# Patient Record
Sex: Male | Born: 1975 | Race: White | Hispanic: No | Marital: Single
Health system: Midwestern US, Community
[De-identification: ages and names within clinical notes are randomized; demographics above are authoritative.]

## PROBLEM LIST (undated history)

## (undated) DIAGNOSIS — G4733 Obstructive sleep apnea (adult) (pediatric): Secondary | ICD-10-CM

## (undated) DIAGNOSIS — E785 Hyperlipidemia, unspecified: Secondary | ICD-10-CM

## (undated) DIAGNOSIS — F329 Major depressive disorder, single episode, unspecified: Secondary | ICD-10-CM

## (undated) DIAGNOSIS — R519 Headache, unspecified: Secondary | ICD-10-CM

## (undated) DIAGNOSIS — I2510 Atherosclerotic heart disease of native coronary artery without angina pectoris: Secondary | ICD-10-CM

## (undated) DIAGNOSIS — F419 Anxiety disorder, unspecified: Secondary | ICD-10-CM

## (undated) DIAGNOSIS — E669 Obesity, unspecified: Secondary | ICD-10-CM

## (undated) DIAGNOSIS — R51 Headache: Secondary | ICD-10-CM

## (undated) DIAGNOSIS — E119 Type 2 diabetes mellitus without complications: Secondary | ICD-10-CM

## (undated) DIAGNOSIS — I10 Essential (primary) hypertension: Secondary | ICD-10-CM

## (undated) DIAGNOSIS — F32A Depression, unspecified: Secondary | ICD-10-CM

## (undated) DIAGNOSIS — E1169 Type 2 diabetes mellitus with other specified complication: Secondary | ICD-10-CM

## (undated) DIAGNOSIS — Z9989 Dependence on other enabling machines and devices: Secondary | ICD-10-CM

## (undated) HISTORY — DX: Hyperlipidemia, unspecified: E78.5

## (undated) HISTORY — DX: Essential (primary) hypertension: I10

## (undated) HISTORY — DX: Type 2 diabetes mellitus without complications: E11.9

## (undated) HISTORY — PX: MOLE REMOVAL: SHX2046

## (undated) HISTORY — DX: Type 2 diabetes mellitus with other specified complication: E66.9

## (undated) HISTORY — DX: Obesity, unspecified: E11.69

## (undated) HISTORY — DX: Atherosclerotic heart disease of native coronary artery without angina pectoris: I25.10

---
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
## 1997-08-31 ENCOUNTER — Ambulatory Visit (HOSPITAL_COMMUNITY): Admission: RE | Admit: 1997-08-31 | Discharge: 1997-08-31 | Payer: Self-pay | Admitting: Family Medicine

## 2001-09-27 ENCOUNTER — Ambulatory Visit (HOSPITAL_BASED_OUTPATIENT_CLINIC_OR_DEPARTMENT_OTHER): Admission: RE | Admit: 2001-09-27 | Discharge: 2001-09-27 | Payer: Self-pay | Admitting: Otolaryngology

## 2003-05-19 ENCOUNTER — Emergency Department (HOSPITAL_COMMUNITY): Admission: EM | Admit: 2003-05-19 | Discharge: 2003-05-19 | Payer: Self-pay | Admitting: Emergency Medicine

## 2003-09-15 ENCOUNTER — Emergency Department (HOSPITAL_COMMUNITY): Admission: EM | Admit: 2003-09-15 | Discharge: 2003-09-15 | Payer: Self-pay | Admitting: Emergency Medicine

## 2005-02-27 ENCOUNTER — Emergency Department (HOSPITAL_COMMUNITY): Admission: EM | Admit: 2005-02-27 | Discharge: 2005-02-27 | Payer: Self-pay | Admitting: Emergency Medicine

## 2007-07-23 ENCOUNTER — Emergency Department (HOSPITAL_COMMUNITY): Admission: EM | Admit: 2007-07-23 | Discharge: 2007-07-23 | Payer: Self-pay | Admitting: Emergency Medicine

## 2008-10-03 ENCOUNTER — Emergency Department (HOSPITAL_COMMUNITY): Admission: EM | Admit: 2008-10-03 | Discharge: 2008-10-03 | Payer: Self-pay | Admitting: Licensed Clinical Social Worker

## 2013-06-19 ENCOUNTER — Encounter (HOSPITAL_COMMUNITY): Payer: Self-pay | Admitting: Emergency Medicine

## 2013-06-19 DIAGNOSIS — M545 Low back pain, unspecified: Secondary | ICD-10-CM | POA: Insufficient documentation

## 2013-06-19 DIAGNOSIS — F17200 Nicotine dependence, unspecified, uncomplicated: Secondary | ICD-10-CM | POA: Insufficient documentation

## 2013-06-19 DIAGNOSIS — E669 Obesity, unspecified: Secondary | ICD-10-CM | POA: Insufficient documentation

## 2013-06-19 DIAGNOSIS — R000 Tachycardia, unspecified: Secondary | ICD-10-CM | POA: Insufficient documentation

## 2013-06-19 NOTE — ED Notes (Signed)
Patient presents with c/o lower back pain.  States it started when he was on the riding mower.   Was seen by the companies MD and was told to go back to work but go back on light duty.  Company unable to provide light duty so he was at home for the week and doing light things.  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

## 2013-06-20 ENCOUNTER — Emergency Department (HOSPITAL_COMMUNITY)
Admission: EM | Admit: 2013-06-20 | Discharge: 2013-06-20 | Disposition: A | Discharge status: Home / Self Care | Payer: Managed Care, Other (non HMO) | Attending: Emergency Medicine | Admitting: Emergency Medicine

## 2013-06-20 ENCOUNTER — Emergency Department (HOSPITAL_COMMUNITY): Payer: Managed Care, Other (non HMO)

## 2013-06-20 DIAGNOSIS — M545 Low back pain, unspecified: Secondary | ICD-10-CM

## 2013-06-20 LAB — URINALYSIS, ROUTINE W REFLEX MICROSCOPIC
BILIRUBIN URINE: NEGATIVE
Glucose, UA: 100 mg/dL — AB
Hgb urine dipstick: NEGATIVE
Ketones, ur: NEGATIVE mg/dL
LEUKOCYTES UA: NEGATIVE
Nitrite: NEGATIVE
Protein, ur: NEGATIVE mg/dL
Specific Gravity, Urine: 1.028 (ref 1.005–1.030)
Urobilinogen, UA: 0.2 mg/dL (ref 0.0–1.0)
pH: 6.5 (ref 5.0–8.0)

## 2013-06-20 MED ORDER — ORPHENADRINE CITRATE ER 100 MG PO TB12: 100.0000 mg | ORAL_TABLET | Freq: Two times a day (BID) | ORAL | Status: DC

## 2013-06-20 MED ORDER — NAPROXEN 500 MG PO TABS: 500.0000 mg | ORAL_TABLET | Freq: Two times a day (BID) | ORAL | Status: DC

## 2013-06-20 MED ORDER — IBUPROFEN 800 MG PO TABS
800.0000 mg | ORAL_TABLET | Freq: Once | ORAL | Status: AC
  Administered 2013-06-20: 800 mg via ORAL
  Filled 2013-06-20: qty 1

## 2013-06-20 NOTE — ED Notes (Signed)
Patient transported to X-ray 

## 2013-06-20 NOTE — ED Provider Notes (Signed)
CSN: [id number]     Arrival date & time 06/19/13  2333 History   First MD Initiated Contact with Patient 06/20/13 [phone]     Chief Complaint  Patient presents with  . Back Pain     (Consider location/radiation/quality/duration/timing/severity/associated sxs/prior Treatment) HPI Comments: 38 year old male with obesity, smoker, alcohol history presents with worsening left lower back pain. Patient has had the back pain for the past one to 2 weeks and improved however recently at work for which he does lift objects it has worsened tonight.Patient denies urinary or bowel changes, active cancer, extremity weakness, IVDU, fevers, immunosuppression or significant trauma. Mild radiation to the groin. No known kidney stone or hernia history. No testicle pain or swelling. No discharge or hematuria. Pain is improved since waiting in ER. Pain is worse with movement and position.   Patient is a 38 y.o. male presenting with back pain. The history is provided by the patient.  Back Pain Associated symptoms: no abdominal pain, no dysuria, no fever, no headaches, no numbness and no weakness     History reviewed. No pertinent past medical history. Past Surgical History  Procedure Laterality Date  . Skin biopsy     History reviewed. No pertinent family history. History  Substance Use Topics  . Smoking status: Current Every Day Smoker  . Smokeless tobacco: Never Used  . Alcohol Use: Yes     Comment: occasionally    Review of Systems  Constitutional: Negative for fever and chills.  Respiratory: Negative for shortness of breath.   Gastrointestinal: Negative for vomiting and abdominal pain.  Genitourinary: Negative for dysuria and flank pain.  Musculoskeletal: Positive for back pain. Negative for neck pain and neck stiffness.  Neurological: Negative for weakness, light-headedness, numbness and headaches.      Allergies  Review of patient's allergies indicates no known allergies.  Home Medications    Prior to Admission medications   Medication Sig Start Date End Date Taking? Authorizing Provider  ibuprofen (ADVIL,MOTRIN) 200 MG tablet Take 400 mg by mouth every 6 (six) hours as needed for moderate pain.   Yes Historical Provider, MD   BP 119/30  Pulse 96  Temp(Src) 98.1 F (36.7 C) (Oral)  Resp 24  Ht 5\' 11"  (1.803 m)  Wt 290 lb (131.543 kg)  BMI 40.46 kg/m2  SpO2 97% Physical Exam  Nursing note and vitals reviewed. Constitutional: He is oriented to person, place, and time. He appears well-developed and well-nourished.  HENT:  Head: Normocephalic and atraumatic.  Eyes: Conjunctivae are normal. Right eye exhibits no discharge. Left eye exhibits no discharge.  Neck: Normal range of motion. Neck supple. No tracheal deviation present.  Cardiovascular: Regular rhythm.  Tachycardia present.   Pulmonary/Chest: Effort normal and breath sounds normal.  Abdominal: Soft. He exhibits no distension. There is no tenderness. There is no guarding.  Musculoskeletal: He exhibits no edema.  Patient tender left lower paraspinal lumbar, mild midline tenderness no step-off. Mild tight musculature.  Neurological: He is alert and oriented to person, place, and time.  Patient has 5+ strength in the lower extremity bilateral flexion extension at major joints. Sensation intact bilateral equal. Normal lower extremities reflexes.  Skin: Skin is warm. No rash noted.  Psychiatric: He has a normal mood and affect.    ED Course  Procedures (including critical care time) Emergency Focused Ultrasound Exam Limited retroperitoneal ultrasound of kidneys  Performed and interpreted by Dr. Reather Converse Indication: flank pain Focused abdominal ultrasound with both kidneys imaged in transverse and longitudinal planes  in real-time. Interpretation: no hydronephrosis visualized.  no stones or cysts visualized  Images archived electronically  Labs Review Labs Reviewed  URINALYSIS, ROUTINE W REFLEX MICROSCOPIC -  Abnormal; Notable for the following:    Glucose, UA 100 (*)    All other components within normal limits    Imaging Review Dg Lumbar Spine 2-3 Views  06/20/2013   CLINICAL DATA:  Low back pain for one week.  EXAM: LUMBAR SPINE - 2-3 VIEW  COMPARISON:  None.  FINDINGS: Five non rib-bearing lumbar-type vertebral bodies are intact and aligned with maintenance of the lumbar lordosis. Intervertebral disc heights are normal. No destructive bony lesions.  Sacroiliac joints are symmetric. Included prevertebral and paraspinal soft tissue planes are non-suspicious.  IMPRESSION: Negative.   Electronically Signed   By: Elon Alas   On: 06/20/2013 03:47     EKG Interpretation None      MDM   Final diagnoses:  Left low back pain   Clinically musculoskeletal versus disc herniation. With radiation of the groin testicular exam was done with normal-appearing testicles, normal position, right is equal bilateral to palpation of thigh, no hernia appreciated. Plan for lumbar x-rays, ibuprofen at bedside ultrasound look for hydronephrosis. Patient is stable to followup outpatient. X-ray reviewed no acute findings. Bedside ultrasound no hydronephrosis.  Results and differential diagnosis were discussed with the patient. Close follow up outpatient was discussed, patient comfortable with the plan.   Filed Vitals:   06/19/13 2341 06/20/13 0245 06/20/13 0257 06/20/13 0315  BP: 142/76 121/60 121/60 119/30  Pulse: 101 97 90 96  Temp: 98.1 F (36.7 C)     TempSrc: Oral     Resp: 18  24   Height: 5\' 11"  (1.803 m)     Weight: 290 lb (131.543 kg)     SpO2: 97% 96% 98% 97%       Mariea Clonts, MD 06/20/13 0600

## 2013-06-20 NOTE — Discharge Instructions (Signed)
Followup with primary care provider for further evaluation. Return to the ER if you develop weakness, bowel or bladder changes, fevers or worsening symptoms. If symptoms continue or worsen you may need an MRI outpatient at the discretion of your provider. Limit heavy lifting.  If you were given medicines take as directed.  If you are on coumadin or contraceptives realize their levels and effectiveness is altered by many different medicines.  If you have any reaction (rash, tongues swelling, other) to the medicines stop taking and see a physician.   Please follow up as directed and return to the ER or see a physician for new or worsening symptoms.  Thank you. Filed Vitals:   06/19/13 2341 06/20/13 0245 06/20/13 0257 06/20/13 0315  BP: 142/76 121/60 121/60 119/30  Pulse: 101 97 90 96  Temp: 98.1 F (36.7 C)     TempSrc: Oral     Resp: 18  24   Height: 5\' 11"  (1.803 m)     Weight: 290 lb (131.543 kg)     SpO2: 97% 96% 98% 97%

## 2013-06-20 NOTE — ED Notes (Signed)
Patient returned from xray.

## 2014-12-27 ENCOUNTER — Encounter: Payer: Self-pay | Admitting: Family Medicine

## 2014-12-27 ENCOUNTER — Ambulatory Visit (INDEPENDENT_AMBULATORY_CARE_PROVIDER_SITE_OTHER): Payer: BLUE CROSS/BLUE SHIELD | Admitting: Family Medicine

## 2014-12-27 VITALS — BP 122/78 | HR 76 | Temp 97.0°F | Ht 71.0 in | Wt 304.0 lb

## 2014-12-27 DIAGNOSIS — R81 Glycosuria: Secondary | ICD-10-CM

## 2014-12-27 DIAGNOSIS — IMO0001 Reserved for inherently not codable concepts without codable children: Secondary | ICD-10-CM

## 2014-12-27 DIAGNOSIS — E1165 Type 2 diabetes mellitus with hyperglycemia: Secondary | ICD-10-CM | POA: Diagnosis not present

## 2014-12-27 DIAGNOSIS — I10 Essential (primary) hypertension: Secondary | ICD-10-CM

## 2014-12-27 DIAGNOSIS — IMO0002 Reserved for concepts with insufficient information to code with codable children: Secondary | ICD-10-CM | POA: Insufficient documentation

## 2014-12-27 DIAGNOSIS — E1169 Type 2 diabetes mellitus with other specified complication: Secondary | ICD-10-CM | POA: Insufficient documentation

## 2014-12-27 DIAGNOSIS — E1159 Type 2 diabetes mellitus with other circulatory complications: Secondary | ICD-10-CM | POA: Insufficient documentation

## 2014-12-27 DIAGNOSIS — E785 Hyperlipidemia, unspecified: Secondary | ICD-10-CM

## 2014-12-27 LAB — POCT GLYCOSYLATED HEMOGLOBIN (HGB A1C): HEMOGLOBIN A1C: 8.4

## 2014-12-27 MED ORDER — METFORMIN HCL 1000 MG PO TABS: 1000.0000 mg | ORAL_TABLET | Freq: Two times a day (BID) | ORAL | Status: DC

## 2014-12-27 MED ORDER — LINAGLIPTIN 5 MG PO TABS: 5.0000 mg | ORAL_TABLET | Freq: Every day | ORAL | Status: DC

## 2014-12-27 NOTE — Progress Notes (Signed)
BP 122/78 mmHg  Pulse 76  Temp(Src) 97 F (36.1 C) (Oral)  Ht '5\' 11"'  (1.803 m)  Wt 304 lb (137.893 kg)  BMI 42.42 kg/m2   Subjective:    Patient ID: Carl [name], male    DOB: Sep 09, 1975, 39 y.o.   MRN: [id number]  HPI: Carl [name] is a 39 y.o. male presenting on 12/27/2014 for Evaluate A1C and microalbumin   HPI Glucose in urine Patient comes in today to establish care with [place] after he was admitted physical and they found glucose in his urine. He is coming to find out whether or not he has diabetes. His hemoglobin A1c that we check today came back as 8.4. We discussed diabetes and the symptoms that come along with it and he has been having increased urination and increased thirst. He denies any chest pain or shortness of breath or numbness or weakness. He denies any new issues with vision. He denies any problems or sores on his feet.  Hypertension Patient is currently on Imdur 30 mg daily and metoprolol 50 mg daily. His blood pressure is controlled at 122/78 today. Patient denies headaches, blurred vision, chest pains, shortness of breath, or weakness. Denies any side effects from medication and is content with current medication.  Hyperlipidemia Patient has been diagnosed with hyperlipidemia in the past and is on atorvastatin 40 mg daily. He denies any side effects or issues with the atorvastatin.  Relevant past medical, surgical, family and social history reviewed and updated as indicated. Interim medical history since our last visit reviewed. Allergies and medications reviewed and updated.  Review of Systems  Constitutional: Negative for fever and appetite change.  HENT: Negative for ear discharge and ear pain.   Eyes: Negative for discharge and visual disturbance.  Respiratory: Negative for chest tightness, shortness of breath and wheezing.   Cardiovascular: Negative for chest pain, palpitations and leg swelling.  Gastrointestinal: Negative for abdominal pain, diarrhea  and constipation.  Endocrine: Positive for polydipsia and polyuria. Negative for cold intolerance and heat intolerance.  Genitourinary: Negative for difficulty urinating.  Musculoskeletal: Negative for back pain and gait problem.  Skin: Negative for rash.  Neurological: Negative for dizziness, syncope, light-headedness and headaches.  All other systems reviewed and are negative.   Per HPI unless specifically indicated above  Social History   Social History  . Marital Status: Legally Separated    Spouse Name: N/A  . Number of Children: N/A  . Years of Education: N/A   Occupational History  . Not on file.   Social History Main Topics  . Smoking status: Current Every Day Smoker  . Smokeless tobacco: Never Used  . Alcohol Use: Yes     Comment: occasionally  . Drug Use: No  . Sexual Activity: Not on file   Other Topics Concern  . Not on file   Social History Narrative    Past Surgical History  Procedure Laterality Date  . Skin biopsy    . Tumor excision      left knee area    Family History  Problem Relation Age of Onset  . Diabetes Mother   . Diabetes Father       Medication List       This list is accurate as of: 12/27/14 10:07 AM.  Always use your most recent med list.               aspirin EC 81 MG tablet  Take 81 mg by mouth.  atorvastatin 80 MG tablet  Commonly known as:  LIPITOR  Take 40 mg by mouth.     isosorbide mononitrate 30 MG 24 hr tablet  Commonly known as:  IMDUR  Take 30 mg by mouth.     metoprolol 50 MG tablet  Commonly known as:  LOPRESSOR  Take 50 mg by mouth daily.     UNABLE TO FIND  Vit C 2000 daily           Objective:    BP 122/78 mmHg  Pulse 76  Temp(Src) 97 F (36.1 C) (Oral)  Ht '5\' 11"'  (1.803 m)  Wt 304 lb (137.893 kg)  BMI 42.42 kg/m2  Wt Readings from Last 3 Encounters:  12/27/14 304 lb (137.893 kg)  06/19/13 290 lb (131.543 kg)    Physical Exam  Constitutional: He is oriented to person, place,  and time. He appears well-developed and well-nourished. No distress.  Eyes: Conjunctivae and EOM are normal. Pupils are equal, round, and reactive to light. Right eye exhibits no discharge. No scleral icterus.  Neck: Neck supple. No thyromegaly present.  Cardiovascular: Normal rate, regular rhythm, normal heart sounds and intact distal pulses.   No murmur heard. Pulmonary/Chest: Effort normal and breath sounds normal. No respiratory distress. He has no wheezes.  Musculoskeletal: Normal range of motion. He exhibits no edema or tenderness.  Lymphadenopathy:    He has no cervical adenopathy.  Neurological: He is alert and oriented to person, place, and time. Coordination normal.  Skin: Skin is warm and dry. No rash noted. He is not diaphoretic.  Psychiatric: He has a normal mood and affect. His behavior is normal.  Vitals reviewed.   Results for orders placed or performed in visit on 12/27/14  POCT glycosylated hemoglobin (Hb A1C)  Result Value Ref Range   Hemoglobin A1C 8.4       Assessment & Plan:   Problem List Items Addressed This Visit      Cardiovascular and Mediastinum   Essential hypertension, benign    Patient has hypertension on metoprolol and Imdur. Continue medications      Relevant Medications   atorvastatin (LIPITOR) 80 MG tablet   isosorbide mononitrate (IMDUR) 30 MG 24 hr tablet   metoprolol (LOPRESSOR) 50 MG tablet   aspirin EC 81 MG tablet   Other Relevant Orders   CMP14+EGFR     Endocrine   Diabetes type 2, uncontrolled ([place])    Patient is a new diabetic with a hemoglobin A1c of 8.4. We will start him on metformin thousand milligrams twice a day and Tradjenta 5 mg daily. He initially will do the first week of metformin by cutting them in half and only taking 500 twice a day 2 reduce the side effects. Refer him to [facility] for education and have him back in 3 months with me.      Relevant Medications   atorvastatin (LIPITOR) 80 MG tablet   aspirin EC 81 MG  tablet   metFORMIN (GLUCOPHAGE) 1000 MG tablet   linagliptin (TRADJENTA) 5 MG TABS tablet   Other Relevant Orders   Ambulatory referral to Ophthalmology   CMP14+EGFR   Lipid panel     Other   Hyperlipidemia LDL goal <130    Patient has been on atorvastatin 40, will check labs.      Relevant Medications   atorvastatin (LIPITOR) 80 MG tablet   isosorbide mononitrate (IMDUR) 30 MG 24 hr tablet   metoprolol (LOPRESSOR) 50 MG tablet   aspirin EC 81 MG tablet  Other Relevant Orders   Lipid panel    Other Visit Diagnoses    Glucose found in urine on examination    -  Primary    Relevant Medications    metFORMIN (GLUCOPHAGE) 1000 MG tablet    linagliptin (TRADJENTA) 5 MG TABS tablet    Other Relevant Orders    Microalbumin, urine    POCT glycosylated hemoglobin (Hb A1C) (Completed)    Ambulatory referral to Ophthalmology        Follow up plan: No Follow-up on file.  Caryl Pina, MD [facility] 12/27/2014, 10:07 AM

## 2014-12-27 NOTE — Assessment & Plan Note (Signed)
Patient has hypertension on metoprolol and Imdur. Continue medications

## 2014-12-27 NOTE — Assessment & Plan Note (Signed)
Patient has been on atorvastatin 40, will check labs.

## 2014-12-27 NOTE — Assessment & Plan Note (Signed)
Patient is a new diabetic with a hemoglobin A1c of 8.4. We will start him on metformin thousand milligrams twice a day and Tradjenta 5 mg daily. He initially will do the first week of metformin by cutting them in half and only taking 500 twice a day 2 reduce the side effects. Refer him to [facility] for education and have him back in 3 months with me.

## 2014-12-27 NOTE — Patient Instructions (Signed)
Diabetes and Exercise Exercising regularly is important. It is not just about losing weight. It has many health benefits, such as:  Improving your overall fitness, flexibility, and endurance.  Increasing your bone density.  Helping with weight control.  Decreasing your body fat.  Increasing your muscle strength.  Reducing stress and tension.  Improving your overall health. People with diabetes who exercise gain additional benefits because exercise:  Reduces appetite.  Improves the body's use of blood sugar (glucose).  Helps lower or control blood glucose.  Decreases blood pressure.  Helps control blood lipids (such as cholesterol and triglycerides).  Improves the body's use of the hormone insulin by:  Increasing the body's insulin sensitivity.  Reducing the body's insulin needs.  Decreases the risk for heart disease because exercising:  Lowers cholesterol and triglycerides levels.  Increases the levels of good cholesterol (such as high-density lipoproteins [HDL]) in the body.  Lowers blood glucose levels. YOUR ACTIVITY PLAN  Choose an activity that you enjoy, and set realistic goals. To exercise safely, you should begin practicing any new physical activity slowly, and gradually increase the intensity of the exercise over time. Your health care provider or diabetes educator can help create an activity plan that works for you. General recommendations include:  Encouraging children to engage in at least 60 minutes of physical activity each day.  Stretching and performing strength training exercises, such as yoga or weight lifting, at least 2 times per week.  Performing a total of at least 150 minutes of moderate-intensity exercise each week, such as brisk walking or water aerobics.  Exercising at least 3 days per week, making sure you allow no more than 2 consecutive days to pass without exercising.  Avoiding long periods of inactivity (90 minutes or more). When you  have to spend an extended period of time sitting down, take frequent breaks to walk or stretch. RECOMMENDATIONS FOR EXERCISING WITH TYPE 1 OR TYPE 2 DIABETES   Check your blood glucose before exercising. If blood glucose levels are greater than 240 mg/dL, check for urine ketones. Do not exercise if ketones are present.  Avoid injecting insulin into areas of the body that are going to be exercised. For example, avoid injecting insulin into:  The arms when playing tennis.  The legs when jogging.  Keep a record of:  Food intake before and after you exercise.  Expected peak times of insulin action.  Blood glucose levels before and after you exercise.  The type and amount of exercise you have done.  Review your records with your health care provider. Your health care provider will help you to develop guidelines for adjusting food intake and insulin amounts before and after exercising.  If you take insulin or oral hypoglycemic agents, watch for signs and symptoms of hypoglycemia. They include:  Dizziness.  Shaking.  Sweating.  Chills.  Confusion.  Drink plenty of water while you exercise to prevent dehydration or heat stroke. Body water is lost during exercise and must be replaced.  Talk to your health care provider before starting an exercise program to make sure it is safe for you. Remember, almost any type of activity is better than none.   This information is not intended to replace advice given to you by your health care provider. Make sure you discuss any questions you have with your health care provider.   Document Released: 05/02/2003 Document Revised: 06/26/2014 Document Reviewed: 07/19/2012 Elsevier Interactive Patient Education 2016 [employer]  

## 2014-12-28 LAB — LIPID PANEL
Chol/HDL Ratio: 4.7 ratio (ref 0.0–5.0)
Cholesterol, Total: 122 mg/dL (ref 100–199)
HDL: 26 mg/dL — ABNORMAL LOW
LDL Calculated: 54 mg/dL (ref 0–99)
Triglycerides: 211 mg/dL — ABNORMAL HIGH (ref 0–149)
VLDL Cholesterol Cal: 42 mg/dL — ABNORMAL HIGH (ref 5–40)

## 2014-12-28 LAB — CMP14+EGFR
A/G RATIO: 1.6 (ref 1.1–2.5)
ALK PHOS: 88 IU/L (ref 39–117)
ALT: 28 IU/L (ref 0–44)
AST: 18 IU/L (ref 0–40)
Albumin: 4.1 g/dL (ref 3.5–5.5)
BUN/Creatinine Ratio: 11 (ref 9–20)
BUN: 10 mg/dL (ref 6–24)
Bilirubin Total: 0.6 mg/dL (ref 0.0–1.2)
CALCIUM: 8.9 mg/dL (ref 8.7–10.2)
CHLORIDE: 100 mmol/L (ref 97–106)
CO2: 24 mmol/L (ref 18–29)
Creatinine, Ser: 0.89 mg/dL (ref 0.76–1.27)
GFR calc Af Amer: 124 mL/min/{1.73_m2} (ref >59)
GFR, EST NON AFRICAN AMERICAN: 107 mL/min/{1.73_m2} (ref >59)
Globulin, Total: 2.6 g/dL (ref 1.5–4.5)
Glucose: 244 mg/dL — ABNORMAL HIGH (ref 65–99)
POTASSIUM: 4.4 mmol/L (ref 3.5–5.2)
Sodium: 137 mmol/L (ref 136–144)
Total Protein: 6.7 g/dL (ref 6.0–8.5)

## 2014-12-28 LAB — MICROALBUMIN, URINE: MICROALBUM., U, RANDOM: 74 ug/mL

## 2014-12-31 ENCOUNTER — Other Ambulatory Visit: Payer: Self-pay

## 2014-12-31 MED ORDER — METOPROLOL TARTRATE 25 MG PO TABS: 25.0000 mg | ORAL_TABLET | Freq: Every day | ORAL | Status: DC

## 2015-01-01 MED ORDER — LISINOPRIL 10 MG PO TABS: 10.0000 mg | ORAL_TABLET | Freq: Every day | ORAL | Status: DC

## 2015-01-01 NOTE — Addendum Note (Signed)
Addended by: Thana Ates on: 01/01/2015 08:34 AM   Modules accepted: Orders

## 2015-01-07 ENCOUNTER — Telehealth: Payer: Self-pay

## 2015-01-07 NOTE — Telephone Encounter (Signed)
Pt has a referral to eye doctor. Who has he seen in the past?

## 2015-01-16 ENCOUNTER — Ambulatory Visit: Payer: BLUE CROSS/BLUE SHIELD | Admitting: Pharmacist

## 2015-01-29 LAB — HM DIABETES EYE EXAM

## 2015-01-30 ENCOUNTER — Encounter: Payer: Self-pay | Admitting: *Deleted

## 2015-03-27 ENCOUNTER — Telehealth: Payer: Self-pay | Admitting: Family Medicine

## 2015-03-27 NOTE — Telephone Encounter (Signed)
Faxed labs DOS 12-27-14 to Dr. Salem Caster @ [place] [phone]. Called patient to let him know they had been faxed.

## 2015-03-29 ENCOUNTER — Ambulatory Visit: Payer: BLUE CROSS/BLUE SHIELD | Admitting: Family Medicine

## 2015-04-01 ENCOUNTER — Encounter: Payer: Self-pay | Admitting: Family Medicine

## 2015-04-10 ENCOUNTER — Ambulatory Visit: Payer: BLUE CROSS/BLUE SHIELD | Admitting: Family Medicine

## 2015-04-25 ENCOUNTER — Other Ambulatory Visit: Payer: Self-pay | Admitting: Family Medicine

## 2015-06-21 ENCOUNTER — Other Ambulatory Visit: Payer: Self-pay | Admitting: Family Medicine

## 2015-07-01 ENCOUNTER — Ambulatory Visit: Payer: BLUE CROSS/BLUE SHIELD | Admitting: Family Medicine

## 2015-07-02 ENCOUNTER — Encounter: Payer: Self-pay | Admitting: Family Medicine

## 2015-07-03 ENCOUNTER — Ambulatory Visit: Payer: BLUE CROSS/BLUE SHIELD | Admitting: Family Medicine

## 2015-07-08 ENCOUNTER — Encounter: Payer: Self-pay | Admitting: Family Medicine

## 2015-07-08 ENCOUNTER — Ambulatory Visit (INDEPENDENT_AMBULATORY_CARE_PROVIDER_SITE_OTHER): Payer: BLUE CROSS/BLUE SHIELD | Admitting: Family Medicine

## 2015-07-08 VITALS — BP 131/81 | HR 93 | Temp 97.6°F | Ht 71.0 in | Wt 303.0 lb

## 2015-07-08 DIAGNOSIS — B351 Tinea unguium: Secondary | ICD-10-CM

## 2015-07-08 DIAGNOSIS — E785 Hyperlipidemia, unspecified: Secondary | ICD-10-CM

## 2015-07-08 DIAGNOSIS — I10 Essential (primary) hypertension: Secondary | ICD-10-CM | POA: Diagnosis not present

## 2015-07-08 DIAGNOSIS — E1165 Type 2 diabetes mellitus with hyperglycemia: Secondary | ICD-10-CM

## 2015-07-08 DIAGNOSIS — E118 Type 2 diabetes mellitus with unspecified complications: Secondary | ICD-10-CM | POA: Insufficient documentation

## 2015-07-08 DIAGNOSIS — IMO0001 Reserved for inherently not codable concepts without codable children: Secondary | ICD-10-CM

## 2015-07-08 DIAGNOSIS — E1169 Type 2 diabetes mellitus with other specified complication: Secondary | ICD-10-CM | POA: Insufficient documentation

## 2015-07-08 LAB — BAYER DCA HB A1C WAIVED: HB A1C: 7.1 % — AB (ref <7.0)

## 2015-07-08 MED ORDER — LISINOPRIL 10 MG PO TABS: 10.0000 mg | ORAL_TABLET | Freq: Every day | ORAL | Status: DC

## 2015-07-08 MED ORDER — ATORVASTATIN CALCIUM 80 MG PO TABS: 40.0000 mg | ORAL_TABLET | Freq: Every day | ORAL | Status: DC

## 2015-07-08 MED ORDER — ISOSORBIDE MONONITRATE ER 30 MG PO TB24: 30.0000 mg | ORAL_TABLET | Freq: Every day | ORAL | Status: DC

## 2015-07-08 MED ORDER — LINAGLIPTIN 5 MG PO TABS: 5.0000 mg | ORAL_TABLET | Freq: Every day | ORAL | Status: DC

## 2015-07-08 MED ORDER — METFORMIN HCL 1000 MG PO TABS: 1000.0000 mg | ORAL_TABLET | Freq: Two times a day (BID) | ORAL | Status: DC

## 2015-07-08 MED ORDER — TERBINAFINE HCL 250 MG PO TABS: 250.0000 mg | ORAL_TABLET | Freq: Every day | ORAL | Status: DC

## 2015-07-08 NOTE — Progress Notes (Signed)
BP 131/81 mmHg  Pulse 93  Temp(Src) 97.6 F (36.4 C) (Oral)  Ht '5\' 11"'  (1.803 m)  Wt 303 lb (137.44 kg)  BMI 42.28 kg/m2   Subjective:    Patient ID: Carl [name], male    DOB: Jan 19, 1976, 40 y.o.   MRN: [id number]  HPI: Carl [name] is a 40 y.o. male presenting on 07/08/2015 for Diabetes and Medication Refill   HPI Type 2 diabetes. Patient is coming in for recheck of his type 2 diabetes today. He admits to not having taken the medicines as consistently as he should. He is both on metformin and Tradjenta. I won't point the Tradjenta cost him too much and then it got approved. He says that sometimes he only takes the metformin once a day and senna twice a day because he often forgets his evening medications. He says that he has been trying to change his diet a little bit but has not really been exercising much. He is not checking his blood sugars because we have not had him go ahead and do that at this point. He denies any new issues with his feet and didn't get to go see an ophthalmologist and they said he did not have any retinopathy currently. He is on an ACE inhibitor  Hypertension recheck Patient is coming in for a blood pressure recheck. His blood pressure today is 131/81. He is currently on lisinopril 10 mg and Imdur 30 and metoprolol 25. Patient denies headaches, blurred vision, chest pains, shortness of breath, or weakness. Denies any side effects from medication and is content with current medication.   Hyperlipidemia Patient is not due for recheck of cholesterol just yet. He is currently taking Lipitor 80 mg. He denies any myalgias or known liver issues.  Toenail thickening Patient is coming in also because he has been having thickening of his toenails and fungus between his toes and wants to do treatment for these. This assessment is been fighting for only 6 months to year that he can remember. He denies any fevers or chills or redness or warmth.  Relevant past medical,  surgical, family and social history reviewed and updated as indicated. Interim medical history since our last visit reviewed. Allergies and medications reviewed and updated.  Review of Systems  Constitutional: Negative for fever and chills.  HENT: Negative for ear discharge and ear pain.   Eyes: Negative for discharge and visual disturbance.  Respiratory: Negative for shortness of breath and wheezing.   Cardiovascular: Negative for chest pain and leg swelling.  Gastrointestinal: Negative for abdominal pain, diarrhea and constipation.  Genitourinary: Negative for urgency, hematuria, decreased urine volume and difficulty urinating.  Musculoskeletal: Negative for back pain and gait problem.  Skin: Negative for color change and rash.  Neurological: Negative for dizziness, syncope, light-headedness and headaches.  All other systems reviewed and are negative.   Per HPI unless specifically indicated above     Medication List       This list is accurate as of: 07/08/15  5:30 PM.  Always use your most recent med list.               aspirin EC 81 MG tablet  Take 81 mg by mouth.     atorvastatin 80 MG tablet  Commonly known as:  LIPITOR  Take 0.5 tablets (40 mg total) by mouth daily at 6 PM.     isosorbide mononitrate 30 MG 24 hr tablet  Commonly known as:  IMDUR  Take 1  tablet (30 mg total) by mouth daily.     linagliptin 5 MG Tabs tablet  Commonly known as:  TRADJENTA  Take 1 tablet (5 mg total) by mouth daily.     lisinopril 10 MG tablet  Commonly known as:  PRINIVIL,ZESTRIL  Take 1 tablet (10 mg total) by mouth daily.     metFORMIN 1000 MG tablet  Commonly known as:  GLUCOPHAGE  Take 1 tablet (1,000 mg total) by mouth 2 (two) times daily with a meal.     metoprolol tartrate 25 MG tablet  Commonly known as:  LOPRESSOR  Take 1 tablet (25 mg total) by mouth daily.     terbinafine 250 MG tablet  Commonly known as:  LAMISIL  Take 1 tablet (250 mg total) by mouth daily.       UNABLE TO FIND  Vit C 2000 daily           Objective:    BP 131/81 mmHg  Pulse 93  Temp(Src) 97.6 F (36.4 C) (Oral)  Ht '5\' 11"'  (1.803 m)  Wt 303 lb (137.44 kg)  BMI 42.28 kg/m2  Wt Readings from Last 3 Encounters:  07/08/15 303 lb (137.44 kg)  12/27/14 304 lb (137.893 kg)  06/19/13 290 lb (131.543 kg)    Physical Exam  Constitutional: He is oriented to person, place, and time. He appears well-developed and well-nourished. No distress.  Eyes: Conjunctivae and EOM are normal. Pupils are equal, round, and reactive to light. Right eye exhibits no discharge. No scleral icterus.  Cardiovascular: Normal rate, regular rhythm, normal heart sounds and intact distal pulses.   No murmur heard. Pulmonary/Chest: Effort normal and breath sounds normal. No respiratory distress. He has no wheezes.  Musculoskeletal: Normal range of motion. He exhibits no edema.  Neurological: He is alert and oriented to person, place, and time. Coordination normal.  Skin: Skin is warm and dry. No rash noted. He is not diaphoretic.  Onychomycosis of feet, worse in bilateral great toe  Psychiatric: He has a normal mood and affect. His behavior is normal.  Vitals reviewed.  Diabetic Foot Exam - Simple   Simple Foot Form  Diabetic Foot exam was performed with the following findings:  Yes 07/08/2015  5:29 PM  Visual Inspection  No deformities, no ulcerations, no other skin breakdown bilaterally:  Yes  Sensation Testing  Intact to touch and monofilament testing bilaterally:  Yes  Pulse Check  Posterior Tibialis and Dorsalis pulse intact bilaterally:  Yes  Comments  onychomycosis       Results for orders placed or performed in visit on 01/30/15  HM DIABETES EYE EXAM  Result Value Ref Range   HM Diabetic Eye Exam No Retinopathy No Retinopathy      Assessment & Plan:   Problem List Items Addressed This Visit      Cardiovascular and Mediastinum   Essential hypertension, benign   Relevant  Medications   lisinopril (PRINIVIL,ZESTRIL) 10 MG tablet   isosorbide mononitrate (IMDUR) 30 MG 24 hr tablet   atorvastatin (LIPITOR) 80 MG tablet   Other Relevant Orders   CMP14+EGFR (Completed)     Endocrine   Uncontrolled type 2 diabetes mellitus without complication, without long-term current use of insulin (HCC) - Primary   Relevant Medications   metFORMIN (GLUCOPHAGE) 1000 MG tablet   lisinopril (PRINIVIL,ZESTRIL) 10 MG tablet   linagliptin (TRADJENTA) 5 MG TABS tablet   atorvastatin (LIPITOR) 80 MG tablet   Other Relevant Orders   Microalbumin / creatinine urine ratio (  Completed)   Bayer DCA Hb A1c Waived (Completed)   CMP14+EGFR (Completed)     Other   Hyperlipidemia LDL goal <130   Relevant Medications   lisinopril (PRINIVIL,ZESTRIL) 10 MG tablet   isosorbide mononitrate (IMDUR) 30 MG 24 hr tablet   atorvastatin (LIPITOR) 80 MG tablet    Other Visit Diagnoses    Onychomycosis        Relevant Medications    terbinafine (LAMISIL) 250 MG tablet        Follow up plan: Return in about 3 months (around 10/08/2015), or if symptoms worsen or fail to improve.  Counseling provided for all of the vaccine components Orders Placed This Encounter  Procedures  . Microalbumin / creatinine urine ratio  . Bayer DCA Hb A1c Waived  . [place] Manika Hast, MD [facility] 07/08/2015, 5:30 PM

## 2015-07-09 LAB — CMP14+EGFR
ALK PHOS: 82 IU/L (ref 39–117)
ALT: 26 IU/L (ref 0–44)
AST: 17 IU/L (ref 0–40)
Albumin/Globulin Ratio: 1.4 (ref 1.2–2.2)
Albumin: 4.2 g/dL (ref 3.5–5.5)
BUN/Creatinine Ratio: 19 (ref 9–20)
BUN: 15 mg/dL (ref 6–24)
Bilirubin Total: 0.5 mg/dL (ref 0.0–1.2)
CALCIUM: 9.1 mg/dL (ref 8.7–10.2)
CO2: 24 mmol/L (ref 18–29)
CREATININE: 0.77 mg/dL (ref 0.76–1.27)
Chloride: 102 mmol/L (ref 96–106)
GFR calc Af Amer: 130 mL/min/{1.73_m2} (ref >59)
GFR, EST NON AFRICAN AMERICAN: 113 mL/min/{1.73_m2} (ref >59)
GLOBULIN, TOTAL: 3.1 g/dL (ref 1.5–4.5)
GLUCOSE: 178 mg/dL — AB (ref 65–99)
Potassium: 4.2 mmol/L (ref 3.5–5.2)
Sodium: 141 mmol/L (ref 134–144)
Total Protein: 7.3 g/dL (ref 6.0–8.5)

## 2015-07-09 LAB — MICROALBUMIN / CREATININE URINE RATIO
Creatinine, Urine: 156.6 mg/dL
MICROALB/CREAT RATIO: 70.6 mg/g{creat} — AB (ref 0.0–30.0)
MICROALBUM., U, RANDOM: 110.5 ug/mL

## 2015-08-23 ENCOUNTER — Ambulatory Visit: Payer: BLUE CROSS/BLUE SHIELD | Admitting: Family Medicine

## 2015-10-10 ENCOUNTER — Telehealth: Payer: Self-pay | Admitting: Family Medicine

## 2015-10-10 ENCOUNTER — Ambulatory Visit (INDEPENDENT_AMBULATORY_CARE_PROVIDER_SITE_OTHER): Payer: BLUE CROSS/BLUE SHIELD | Admitting: Family Medicine

## 2015-10-10 ENCOUNTER — Encounter: Payer: Self-pay | Admitting: Family Medicine

## 2015-10-10 VITALS — BP 103/66 | HR 84 | Temp 97.0°F | Ht 71.0 in | Wt 291.2 lb

## 2015-10-10 DIAGNOSIS — E785 Hyperlipidemia, unspecified: Secondary | ICD-10-CM

## 2015-10-10 DIAGNOSIS — F418 Other specified anxiety disorders: Secondary | ICD-10-CM | POA: Diagnosis not present

## 2015-10-10 DIAGNOSIS — I10 Essential (primary) hypertension: Secondary | ICD-10-CM | POA: Diagnosis not present

## 2015-10-10 DIAGNOSIS — F32A Depression, unspecified: Secondary | ICD-10-CM

## 2015-10-10 DIAGNOSIS — F329 Major depressive disorder, single episode, unspecified: Secondary | ICD-10-CM | POA: Insufficient documentation

## 2015-10-10 DIAGNOSIS — IMO0001 Reserved for inherently not codable concepts without codable children: Secondary | ICD-10-CM

## 2015-10-10 DIAGNOSIS — E1165 Type 2 diabetes mellitus with hyperglycemia: Secondary | ICD-10-CM

## 2015-10-10 DIAGNOSIS — F419 Anxiety disorder, unspecified: Secondary | ICD-10-CM

## 2015-10-10 DIAGNOSIS — G4733 Obstructive sleep apnea (adult) (pediatric): Secondary | ICD-10-CM | POA: Insufficient documentation

## 2015-10-10 LAB — BAYER DCA HB A1C WAIVED: HB A1C: 6.5 % (ref <7.0)

## 2015-10-10 MED ORDER — LINAGLIPTIN 5 MG PO TABS: 5.0000 mg | ORAL_TABLET | Freq: Every day | ORAL | 1 refills | Status: DC

## 2015-10-10 MED ORDER — METOPROLOL TARTRATE 25 MG PO TABS: 25.0000 mg | ORAL_TABLET | Freq: Every day | ORAL | 1 refills | Status: DC

## 2015-10-10 MED ORDER — ISOSORBIDE MONONITRATE ER 30 MG PO TB24
30.0000 mg | ORAL_TABLET | Freq: Every day | ORAL | 1 refills | Status: DC
Start: 2015-10-10 — End: 2016-04-24

## 2015-10-10 MED ORDER — METFORMIN HCL 1000 MG PO TABS: 1000.0000 mg | ORAL_TABLET | Freq: Two times a day (BID) | ORAL | 1 refills | Status: DC

## 2015-10-10 MED ORDER — LISINOPRIL 10 MG PO TABS: 10.0000 mg | ORAL_TABLET | Freq: Every day | ORAL | 1 refills | Status: DC

## 2015-10-10 MED ORDER — ATORVASTATIN CALCIUM 80 MG PO TABS: 40.0000 mg | ORAL_TABLET | Freq: Every day | ORAL | 1 refills | Status: DC

## 2015-10-10 NOTE — Telephone Encounter (Signed)
Patient called to make [place] aware that he will be using [facility] for his cpap machine and supplies.  He wants [place] to contact them when we get his paper chart back and let them know the setting information.  I told him that we would take care of this.

## 2015-10-10 NOTE — Assessment & Plan Note (Signed)
Patient admits to having anxiety and depression and suicidal thoughts but no action plans or thoughts to actually carry out. Discussed the possibility of starting the medication and patient does not want to do that. 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 Gave him a suicide hotline number

## 2015-10-10 NOTE — Progress Notes (Signed)
BP 103/66 (BP Location: Left Arm, Patient Position: Sitting, Cuff Size: Large)   Pulse 84   Temp 97 F (36.1 C) (Oral)   Ht _0  (1.803 m)   Wt 291 lb 3.2 oz (132.1 kg)   BMI 40.61 kg/m    Subjective:    Patient ID: Carl [name], male    DOB: 27-Apr-1975, 40 y.o.   MRN: [id number]  HPI: Carl [name] is a 40 y.o. male presenting on 10/10/2015 for Hyperlipidemia (3 month followup, patient is fasting); Hypertension; and Diabetes   HPI Hypertension recheck Patient is coming in today for hypertension recheck. His blood pressure today is 103/66. He is currently on lisinopril and metoprolol and Imdur. Patient denies headaches, blurred vision, chest pains, shortness of breath, or weakness. Denies any side effects from medication and is content with current medication.   Type 2 diabetes recheck Patient is coming in for recheck of type 2 diabetes today as well. He is currently on Tradjenta and metformin. He denies any issues with these medications. He does not check his blood sugar to regularly so does not know exactly where it runs. He denies any issues with his feet or his vision that he knows of. He is currently on an ACE inhibitor. He is also currently on a statin.  Hyperlipidemia recheck Patient is coming in for cholesterol today. He is currently on Lipitor 40 mg and denies any myalgias with it.  Sleep apnea symptoms Patient comes in today with complaints that his wife has told him that he snores at night and he has stopped breathing on multiple occasions and sometimes he does wake up gasping for air. He also says he has nonrestorative sleep and feels like he can fall asleep just about any time during the day if he is not doing something.  Anxiety and depression Patient scored high on his pH Q9 with a total of 12 and that he is having symptoms more than half the days and he says he even has some suicidal ideations but denies any action plan. He says he has trouble sleeping at night and  has decreased energy and difficulty doing the things that he usually likes to do. He denies any hopelessness or helplessness. Patient admits to feelings of depression but he does not want to do any kind of treatment at this point. He also does not want to do any type of counseling at this point. He says he has been like this for most of his life and is no different than what it usually is.  Relevant past medical, surgical, family and social history reviewed and updated as indicated. Interim medical history since our last visit reviewed. Allergies and medications reviewed and updated.  Review of Systems  Constitutional: Negative for chills and fever.  HENT: Negative for ear discharge and ear pain.   Eyes: Negative for discharge and visual disturbance.  Respiratory: Negative for shortness of breath and wheezing.   Cardiovascular: Negative for chest pain and leg swelling.  Gastrointestinal: Negative for abdominal pain, constipation and diarrhea.  Genitourinary: Negative for difficulty urinating.  Musculoskeletal: Negative for back pain and gait problem.  Skin: Negative for rash.  Neurological: Negative for syncope, light-headedness and headaches.  Psychiatric/Behavioral: Positive for dysphoric mood, sleep disturbance and suicidal ideas (But no action plan or feelings that he was actually carry it out.Marland Kitchen). Negative for decreased concentration and self-injury. The patient is nervous/anxious.   All other systems reviewed and are negative.   Per HPI unless specifically  indicated above     Medication List       Accurate as of 10/10/15  9:05 AM. Always use your most recent med list.          aspirin EC 81 MG tablet Take 81 mg by mouth.   atorvastatin 80 MG tablet Commonly known as:  LIPITOR Take 0.5 tablets (40 mg total) by mouth daily at 6 PM.   isosorbide mononitrate 30 MG 24 hr tablet Commonly known as:  IMDUR Take 1 tablet (30 mg total) by mouth daily.   linagliptin 5 MG Tabs  tablet Commonly known as:  TRADJENTA Take 1 tablet (5 mg total) by mouth daily.   lisinopril 10 MG tablet Commonly known as:  PRINIVIL,ZESTRIL Take 1 tablet (10 mg total) by mouth daily.   metFORMIN 1000 MG tablet Commonly known as:  GLUCOPHAGE Take 1 tablet (1,000 mg total) by mouth 2 (two) times daily with a meal.   metoprolol tartrate 25 MG tablet Commonly known as:  LOPRESSOR Take 1 tablet (25 mg total) by mouth daily.   terbinafine 250 MG tablet Commonly known as:  LAMISIL Take 1 tablet (250 mg total) by mouth daily.   UNABLE TO FIND Vit C 2000 daily          Objective:    BP 103/66 (BP Location: Left Arm, Patient Position: Sitting, Cuff Size: Large)   Pulse 84   Temp 97 F (36.1 C) (Oral)   Ht _0  (1.803 m)   Wt 291 lb 3.2 oz (132.1 kg)   BMI 40.61 kg/m   Wt Readings from Last 3 Encounters:  10/10/15 291 lb 3.2 oz (132.1 kg)  07/08/15 (!) 303 lb (137.4 kg)  12/27/14 (!) 304 lb (137.9 kg)    Physical Exam  Constitutional: He is oriented to person, place, and time. He appears well-developed and well-nourished. No distress.  Eyes: Conjunctivae and EOM are normal. Pupils are equal, round, and reactive to light. Right eye exhibits no discharge. No scleral icterus.  Neck: Neck supple. No thyromegaly present.  Cardiovascular: Normal rate, regular rhythm, normal heart sounds and intact distal pulses.   No murmur heard. Pulmonary/Chest: Effort normal and breath sounds normal. No respiratory distress. He has no wheezes.  Musculoskeletal: Normal range of motion. He exhibits no edema.  Lymphadenopathy:    He has no cervical adenopathy.  Neurological: He is alert and oriented to person, place, and time. Coordination normal.  Skin: Skin is warm and dry. No rash noted. He is not diaphoretic.  Psychiatric: His behavior is normal. Judgment normal. His mood appears anxious. Cognition and memory are normal. He exhibits a depressed mood. He expresses suicidal ideation. He  expresses no suicidal plans.  Nursing note and vitals reviewed.     Assessment & Plan:   Problem List Items Addressed This Visit      Cardiovascular and Mediastinum   Essential hypertension, benign - Primary   Relevant Medications   metoprolol tartrate (LOPRESSOR) 25 MG tablet   lisinopril (PRINIVIL,ZESTRIL) 10 MG tablet   isosorbide mononitrate (IMDUR) 30 MG 24 hr tablet   atorvastatin (LIPITOR) 80 MG tablet   Other Relevant Orders   CMP14+EGFR   TSH     Respiratory   OSA (obstructive sleep apnea)   Relevant Orders   For home use only DME continuous positive airway pressure (CPAP)   Ambulatory referral to Sleep Studies     Endocrine   Uncontrolled type 2 diabetes mellitus without complication, without long-term current use of insulin (  [place])   Relevant Medications   metFORMIN (GLUCOPHAGE) 1000 MG tablet   lisinopril (PRINIVIL,ZESTRIL) 10 MG tablet   linagliptin (TRADJENTA) 5 MG TABS tablet   atorvastatin (LIPITOR) 80 MG tablet   Other Relevant Orders   Bayer DCA Hb A1c Waived   TSH     Other   Hyperlipidemia LDL goal <130   Relevant Medications   metoprolol tartrate (LOPRESSOR) 25 MG tablet   lisinopril (PRINIVIL,ZESTRIL) 10 MG tablet   isosorbide mononitrate (IMDUR) 30 MG 24 hr tablet   atorvastatin (LIPITOR) 80 MG tablet   Other Relevant Orders   Lipid panel   Anxiety and depression    Patient admits to having anxiety and depression and suicidal thoughts but no action plans or thoughts to actually carry out. Discussed the possibility of starting the medication and patient does not want to do that. 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 Gave him a suicide hotline number       Other Visit Diagnoses   None.      Follow up plan: Return in about 3 months (around 01/10/2016), or if symptoms worsen or fail to improve, for Diabetes and hypertension  follow-up.  Counseling provided for all of the vaccine components Orders Placed This Encounter  Procedures  . For home use only DME continuous positive airway pressure (CPAP)  . Bayer DCA Hb A1c Waived  . Lipid panel  . CMP14+EGFR  . TSH  . Ambulatory referral to Sleep Studies    Caryl Pina, MD [facility] 10/10/2015, 9:05 AM

## 2015-10-11 LAB — CMP14+EGFR
ALK PHOS: 69 IU/L (ref 39–117)
ALT: 24 IU/L (ref 0–44)
AST: 16 IU/L (ref 0–40)
Albumin/Globulin Ratio: 1.4 (ref 1.2–2.2)
Albumin: 4.3 g/dL (ref 3.5–5.5)
BUN/Creatinine Ratio: 18 (ref 9–20)
BUN: 17 mg/dL (ref 6–24)
Bilirubin Total: 0.4 mg/dL (ref 0.0–1.2)
CO2: 21 mmol/L (ref 18–29)
CREATININE: 0.94 mg/dL (ref 0.76–1.27)
Calcium: 9.5 mg/dL (ref 8.7–10.2)
Chloride: 101 mmol/L (ref 96–106)
GFR calc Af Amer: 116 mL/min/{1.73_m2} (ref >59)
GFR calc non Af Amer: 100 mL/min/{1.73_m2} (ref >59)
GLUCOSE: 144 mg/dL — AB (ref 65–99)
Globulin, Total: 3 g/dL (ref 1.5–4.5)
Potassium: 4.5 mmol/L (ref 3.5–5.2)
SODIUM: 138 mmol/L (ref 134–144)
Total Protein: 7.3 g/dL (ref 6.0–8.5)

## 2015-10-11 LAB — LIPID PANEL
CHOL/HDL RATIO: 4.9 ratio (ref 0.0–5.0)
Cholesterol, Total: 143 mg/dL (ref 100–199)
HDL: 29 mg/dL — ABNORMAL LOW (ref >39)
LDL CALC: 73 mg/dL (ref 0–99)
Triglycerides: 203 mg/dL — ABNORMAL HIGH (ref 0–149)
VLDL CHOLESTEROL CAL: 41 mg/dL — AB (ref 5–40)

## 2015-10-11 LAB — TSH: TSH: 1.5 u[IU]/mL (ref 0.450–4.500)

## 2015-10-18 ENCOUNTER — Telehealth: Payer: Self-pay | Admitting: Family Medicine

## 2015-10-18 NOTE — Telephone Encounter (Signed)
Spoke to pt and he has already spoken with Jan and doesn't need anymore assistance. Will close encounter.

## 2015-10-18 NOTE — Telephone Encounter (Signed)
Copy of sleep study was faxed to [facility], attn HME, patient was informed.

## 2015-12-19 ENCOUNTER — Ambulatory Visit (INDEPENDENT_AMBULATORY_CARE_PROVIDER_SITE_OTHER)
Admission: RE | Admit: 2015-12-19 | Discharge: 2015-12-19 | Disposition: A | Discharge status: Home / Self Care | Payer: BLUE CROSS/BLUE SHIELD | Source: Ambulatory Visit | Attending: Pulmonary Disease | Admitting: Pulmonary Disease

## 2015-12-19 ENCOUNTER — Ambulatory Visit (INDEPENDENT_AMBULATORY_CARE_PROVIDER_SITE_OTHER): Payer: BLUE CROSS/BLUE SHIELD | Admitting: Pulmonary Disease

## 2015-12-19 ENCOUNTER — Encounter: Payer: Self-pay | Admitting: Pulmonary Disease

## 2015-12-19 VITALS — BP 132/94 | HR 93 | Ht 71.0 in | Wt 296.2 lb

## 2015-12-19 DIAGNOSIS — G4733 Obstructive sleep apnea (adult) (pediatric): Secondary | ICD-10-CM | POA: Diagnosis not present

## 2015-12-19 DIAGNOSIS — R0609 Other forms of dyspnea: Secondary | ICD-10-CM | POA: Diagnosis not present

## 2015-12-19 DIAGNOSIS — Z720 Tobacco use: Secondary | ICD-10-CM | POA: Insufficient documentation

## 2015-12-19 DIAGNOSIS — R0600 Dyspnea, unspecified: Secondary | ICD-10-CM | POA: Diagnosis not present

## 2015-12-19 DIAGNOSIS — Z6841 Body Mass Index (BMI) 40.0 and over, adult: Secondary | ICD-10-CM

## 2015-12-19 DIAGNOSIS — E662 Morbid (severe) obesity with alveolar hypoventilation: Secondary | ICD-10-CM

## 2015-12-19 NOTE — Assessment & Plan Note (Signed)
Patient with exertional dyspnea. 40 pack year smoking history. Has CAD. Strong family history of cancer.  Exertional dyspnea 2/2 " 1. Morbid obesity. 2. Restrictive ventilatory defect. 3. Likely underlying COPD. He wants to hold off on PFTs and meds. 4. CAD  Plan : 1. CXR today. May need chest ct scan.  2. He wants to hold off on meds.  3. We does not want vaccines.

## 2015-12-19 NOTE — Assessment & Plan Note (Addendum)
Weight reduction 

## 2015-12-19 NOTE — Progress Notes (Signed)
Subjective:    Patient ID: Carl [name], male    DOB: [birth year], 40 y.o.   MRN: [id number]  HPI   This is the case of Carl [name], 40 y.o. Male, who was referred by Dr. Vonna Kotyk Dettinger  in consultation regarding OSA.   As you very well know, patient 72 PY smoking history, not known to hava asthma or COPD.  Patient was diagnosed with OSA in 2002 based on a lab sleep study in [place].  He had snoring, winessed apneas, gasping, choking. Had unrefreshed sleep.  Patient was placed on CPAP. He felt better with it. He has had 3 macines. Last machine paid by insurance was roughly 10 yrs ago. That machine broke and he needed a new machine. His insurance did not approve for a new machine. He was able to get a loaner machine. That machine he got 5 yrs ago.  He was using it and he felt better. Unfortunately, machine broke 5 months ago.   His sx are worse off cpap.  Has snoring, witnessed apneas,occasional gasping and choking. Has hypersomnia in am.  Hypersomnia affects his fxnality.   ESS 14.   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 Hypersomnia affects his fxnality.    He has CAD and DM.    Review of Systems  Constitutional: Negative.  Negative for fever and unexpected weight change.  HENT: Positive for ear pain. Negative for congestion, dental problem, nosebleeds, postnasal drip, rhinorrhea, sinus pressure, sneezing, sore throat and trouble swallowing.   Eyes: Positive for redness and itching.  Respiratory: Positive for cough and chest tightness. Negative for shortness of breath and wheezing.   Cardiovascular: Negative.  Negative for palpitations and leg swelling.  Gastrointestinal: Negative.  Negative for nausea and vomiting.  Endocrine: Negative.   Genitourinary: Negative.  Negative for dysuria.  Musculoskeletal: Negative.  Negative for joint swelling.  Skin: Negative.  Negative for rash.  Allergic/Immunologic: Positive for environmental allergies.  Neurological: Positive for  headaches.  Hematological: Bruises/bleeds easily.  Psychiatric/Behavioral: Negative.  Negative for dysphoric mood. The patient is not nervous/anxious.    Past Medical History:  Diagnosis Date  . Diabetes mellitus without complication ([place])   . Hyperlipidemia   . Hypertension    (-) CA, DVT  Family History  Problem Relation Age of Onset  . Diabetes Mother   . Diabetes Father   Father has renal CA. Mother had breast CA.   Past Surgical History:  Procedure Laterality Date  . SKIN BIOPSY    . TUMOR EXCISION     left knee area    Social History   Social History  . Marital status: Legally Separated    Spouse name: N/A  . Number of children: N/A  . Years of education: N/A   Occupational History  . Not on file.   Social History Main Topics  . Smoking status: Current Every Day Smoker  . Smokeless tobacco: Never Used  . Alcohol use Yes     Comment: occasionally  . Drug use: No  . Sexual activity: Not on file   Other Topics Concern  . Not on file   Social History Narrative  . No narrative on file   Divroced, has a son. Is a truck driver. Occasional ETOH.   No Known Allergies   Outpatient Medications Prior to Visit  Medication Sig Dispense Refill  . aspirin EC 81 MG tablet Take 81 mg by mouth.    Marland Kitchen atorvastatin (LIPITOR) 80 MG  tablet Take 0.5 tablets (40 mg total) by mouth daily at 6 PM. 90 tablet 1  . isosorbide mononitrate (IMDUR) 30 MG 24 hr tablet Take 1 tablet (30 mg total) by mouth daily. 90 tablet 1  . linagliptin (TRADJENTA) 5 MG TABS tablet Take 1 tablet (5 mg total) by mouth daily. 90 tablet 1  . lisinopril (PRINIVIL,ZESTRIL) 10 MG tablet Take 1 tablet (10 mg total) by mouth daily. 90 tablet 1  . metFORMIN (GLUCOPHAGE) 1000 MG tablet Take 1 tablet (1,000 mg total) by mouth 2 (two) times daily with a meal. 180 tablet 1  . metoprolol tartrate (LOPRESSOR) 25 MG tablet Take 1 tablet (25 mg total) by mouth daily. 90 tablet 1  . UNABLE TO FIND Vit C 2000 daily      . terbinafine (LAMISIL) 250 MG tablet Take 1 tablet (250 mg total) by mouth daily. (Patient not taking: Reported on 12/19/2015) 90 tablet 0   No facility-administered medications prior to visit.    No orders of the defined types were placed in this encounter.       Objective:   Physical Exam  Vitals:  Vitals:   12/19/15 1029  BP: (!) 132/94  Pulse: 93  SpO2: 96%  Weight: 296 lb 3.2 oz (134.4 kg)  Height: 5\' 11"  (1.803 m)    Constitutional/General:  Pleasant, well-nourished, well-developed, not in any distress,  Comfortably seating.  Well kempt  Body mass index is 41.31 kg/m. Wt Readings from Last 3 Encounters:  12/19/15 296 lb 3.2 oz (134.4 kg)  10/10/15 291 lb 3.2 oz (132.1 kg)  07/08/15 (!) 303 lb (137.4 kg)    HEENT: Pupils equal and reactive to light and accommodation. Anicteric sclerae. Normal nasal mucosa.   No oral  lesions,  mouth clear,  oropharynx clear, no postnasal drip. (-) Oral thrush. No dental caries.  Airway - Mallampati class IV  Neck: No masses. Midline trachea. No JVD, (-) LAD. (-) bruits appreciated.  Respiratory/Chest: Grossly normal chest. (-) deformity. (-) Accessory muscle use.  Symmetric expansion. (-) Tenderness on palpation.  Resonant on percussion.  Diminished BS on both lower lung zones. (-) wheezing, crackles, rhonchi (-) egophony  Cardiovascular: Regular rate and  rhythm, heart sounds normal, no murmur or gallops, no peripheral edema  Gastrointestinal:  Normal bowel sounds. Soft, non-tender. No hepatosplenomegaly.  (-) masses.   Musculoskeletal:  Normal muscle tone. Normal gait.   Extremities: Grossly normal. (-) clubbing, cyanosis.  (-) edema  Skin: (-) rash,lesions seen.   Neurological/Psychiatric : alert, oriented to time, place, person. Normal mood and affect          Assessment & Plan:  OSA (obstructive sleep apnea) Patient was diagnosed with OSA in 2002 based on a lab sleep study in [place].  He had snoring,  winessed apneas, gasping, choking. Had unrefreshed sleep.  Patient was placed on CPAP. He felt better with it. He has had 3 macines. Last machine paid by insurance was roughly 10 yrs ago. That machine broke and he needed a new machine. His insurance did not approve for a new machine. He was able to get a loaner machine. That machine he got 5 yrs ago.  He was using it and he felt better. Unfortunately, machine broke 5 months ago.   His sx are worse off cpap.  Has snoring, witnessed apneas,occasional gasping and choking. Has hypersomnia in am.  Hypersomnia affects his fxnality.   ESS 14.   Plan:  We discussed about the diagnosis of Obstructive Sleep  Apnea (OSA) and implications of untreated OSA. We discussed about CPAP and BiPaP as possible treatment options.    We will schedule the patient for a sleep study. Patient has a lot of deductible. Plan for a home sleep study. Anticipate no issues with CPAP. Plan to start with auto CPAP 5-15 centimeters water. Needs good correction secondary to his work. If auto CPAP will not work, he will need a sleep study for BiPAP Titration study.   Patient was instructed to call the office if he/she has not heard back from the office 1-2 weeks after the sleep study.   Patient was instructed to call the office if he/she is having issues with the PAP device.   We discussed good sleep hygiene.   Patient was advised not to engage in activities requiring concentration and/or vigilance if he/she is sleepy.  Patient was advised not to drive if he/she is sleepy.   Exertional dyspnea Patient with exertional dyspnea. 40 pack year smoking history. Has CAD. Strong family history of cancer.  Exertional dyspnea 2/2 " 1. Morbid obesity. 2. Restrictive ventilatory defect. 3. Likely underlying COPD. He wants to hold off on PFTs and meds. 4. CAD  Plan : 1. CXR today. May need chest ct scan.  2. He wants to hold off on meds.  3. We does not want vaccines.   Tobacco  user Smoking cessation done.   Morbid obesity ([place]) Weight reduction  Obesity hypoventilation syndrome ([place]) Needs abg on f/u.  Needs sleep study. Hopefully better with cpap.      Thank you very much for letting me participate in this patient's care. Please do not hesitate to give me a call if you have any questions or concerns regarding the treatment plan.   Patient will follow up with me in 6-8 weeks.     Monica Becton, MD 12/19/2015   11:25 AM Pulmonary and [place] Pager: [phone] Office: [phone], Fax: [phone]

## 2015-12-19 NOTE — Assessment & Plan Note (Signed)
Patient was diagnosed with OSA in 2002 based on a lab sleep study in [place].  He had snoring, winessed apneas, gasping, choking. Had unrefreshed sleep.  Patient was placed on CPAP. He felt better with it. He has had 3 macines. Last machine paid by insurance was roughly 10 yrs ago. That machine broke and he needed a new machine. His insurance did not approve for a new machine. He was able to get a loaner machine. That machine he got 5 yrs ago.  He was using it and he felt better. Unfortunately, machine broke 5 months ago.   His sx are worse off cpap.  Has snoring, witnessed apneas,occasional gasping and choking. Has hypersomnia in am.  Hypersomnia affects his fxnality.   ESS 14.   Plan:  We discussed about the diagnosis of Obstructive Sleep Apnea (OSA) and implications of untreated OSA. We discussed about CPAP and BiPaP as possible treatment options.    We will schedule the patient for a sleep study. Patient has a lot of deductible. Plan for a home sleep study. Anticipate no issues with CPAP. Plan to start with auto CPAP 5-15 centimeters water. Needs good correction secondary to his work. If auto CPAP will not work, he will need a sleep study for BiPAP Titration study.   Patient was instructed to call the office if he/she has not heard back from the office 1-2 weeks after the sleep study.   Patient was instructed to call the office if he/she is having issues with the PAP device.   We discussed good sleep hygiene.   Patient was advised not to engage in activities requiring concentration and/or vigilance if he/she is sleepy.  Patient was advised not to drive if he/she is sleepy.

## 2015-12-19 NOTE — Patient Instructions (Signed)
It was a pleasure taking care of you today!  We will schedule you to have a sleep study to determine if you have sleep apnea.   We will get a home sleep test.  You will be instructed to come back to the office to get an apparatus to sleep with overnight.  Once we have the apparatus, it will usually take [place] 1-2 weeks to read the study and get back at you with results of the test.  Please give [place] a call in 2 weeks after your study if you do not hear back from [place].   If the sleep study is positive, we will order you a CPAP  machine.  Please call the office if you do NOT receive your machine in the next 1-2 weeks.   Please make sure you use your CPAP device everytime you sleep.  We will monitor the usage of your machine per your insurance requirement.  Your insurance company may take the machine from you if you are not using it regularly.   Please clean the mask, tubings, filter, water reservoir with soapy water every week.  Please use distilled water for the water reservoir.   Please call the office or your machine provider (DME company) if you are having issues with the device.   We will get a chest X ray.   Return to clinic in 6-8 weeks with Dr. Corrie Dandy or NP

## 2015-12-19 NOTE — Assessment & Plan Note (Signed)
Smoking cessation done.  

## 2015-12-19 NOTE — Assessment & Plan Note (Signed)
Needs abg on f/u.  Needs sleep study. Hopefully better with cpap.

## 2015-12-20 NOTE — Progress Notes (Signed)
Tried to call pt. But vm was not set up yet

## 2015-12-27 ENCOUNTER — Telehealth: Payer: Self-pay | Admitting: Family Medicine

## 2015-12-27 DIAGNOSIS — I10 Essential (primary) hypertension: Secondary | ICD-10-CM

## 2015-12-27 DIAGNOSIS — R0609 Other forms of dyspnea: Secondary | ICD-10-CM

## 2015-12-27 DIAGNOSIS — Z720 Tobacco use: Secondary | ICD-10-CM

## 2015-12-27 NOTE — Telephone Encounter (Signed)
Referral to Cardiologists ordered

## 2015-12-27 NOTE — Telephone Encounter (Signed)
Patient is requesting a referral to cardiology for a treadmill. Patients cardiologist in [place] moved and he wants to go somewhere local.

## 2016-01-13 DIAGNOSIS — G4733 Obstructive sleep apnea (adult) (pediatric): Secondary | ICD-10-CM | POA: Diagnosis not present

## 2016-01-17 ENCOUNTER — Ambulatory Visit: Payer: BLUE CROSS/BLUE SHIELD | Admitting: Family Medicine

## 2016-01-22 ENCOUNTER — Encounter: Payer: Self-pay | Admitting: Family Medicine

## 2016-01-22 ENCOUNTER — Ambulatory Visit (INDEPENDENT_AMBULATORY_CARE_PROVIDER_SITE_OTHER): Payer: BLUE CROSS/BLUE SHIELD | Admitting: Family Medicine

## 2016-01-22 ENCOUNTER — Ambulatory Visit: Payer: BLUE CROSS/BLUE SHIELD | Admitting: Family Medicine

## 2016-01-22 ENCOUNTER — Encounter: Payer: Self-pay | Admitting: Cardiology

## 2016-01-22 ENCOUNTER — Telehealth: Payer: Self-pay | Admitting: Pulmonary Disease

## 2016-01-22 ENCOUNTER — Encounter: Payer: Self-pay | Admitting: *Deleted

## 2016-01-22 ENCOUNTER — Ambulatory Visit (INDEPENDENT_AMBULATORY_CARE_PROVIDER_SITE_OTHER): Payer: BLUE CROSS/BLUE SHIELD | Admitting: Cardiology

## 2016-01-22 VITALS — BP 122/77 | HR 90 | Temp 96.8°F | Ht 71.0 in | Wt 296.4 lb

## 2016-01-22 VITALS — BP 122/80 | HR 102 | Ht 70.0 in | Wt 297.0 lb

## 2016-01-22 DIAGNOSIS — E782 Mixed hyperlipidemia: Secondary | ICD-10-CM | POA: Diagnosis not present

## 2016-01-22 DIAGNOSIS — R079 Chest pain, unspecified: Secondary | ICD-10-CM | POA: Diagnosis not present

## 2016-01-22 DIAGNOSIS — R809 Proteinuria, unspecified: Secondary | ICD-10-CM | POA: Diagnosis not present

## 2016-01-22 DIAGNOSIS — I10 Essential (primary) hypertension: Secondary | ICD-10-CM | POA: Diagnosis not present

## 2016-01-22 DIAGNOSIS — G4733 Obstructive sleep apnea (adult) (pediatric): Secondary | ICD-10-CM

## 2016-01-22 DIAGNOSIS — I2510 Atherosclerotic heart disease of native coronary artery without angina pectoris: Secondary | ICD-10-CM | POA: Diagnosis not present

## 2016-01-22 DIAGNOSIS — E785 Hyperlipidemia, unspecified: Secondary | ICD-10-CM

## 2016-01-22 DIAGNOSIS — E1165 Type 2 diabetes mellitus with hyperglycemia: Secondary | ICD-10-CM

## 2016-01-22 DIAGNOSIS — IMO0001 Reserved for inherently not codable concepts without codable children: Secondary | ICD-10-CM

## 2016-01-22 LAB — BAYER DCA HB A1C WAIVED: HB A1C (BAYER DCA - WAIVED): 6.6 % (ref <7.0)

## 2016-01-22 MED ORDER — METOPROLOL SUCCINATE ER 25 MG PO TB24: 25.0000 mg | ORAL_TABLET | Freq: Every day | ORAL | 6 refills | Status: DC

## 2016-01-22 MED ORDER — LISINOPRIL 10 MG PO TABS: 10.0000 mg | ORAL_TABLET | Freq: Every day | ORAL | 2 refills | Status: DC

## 2016-01-22 NOTE — Progress Notes (Signed)
Clinical Summary Carl [name] is a 40 y.o.male former patient of Dr Carl [name] at [facility], this is our first visit together. He is seen for the following medical problems.  1. CAD - abnormal exericse stress in 2015. - cath 2015 at [facility] with occluded proximal LAD, LAD filled with left-left and right-left collaterals. Medically managed - reports recent chest pain on Thanksgiving night. Pressure while walkg, midchest. 3/10 in severity. No other associated symptoms. Not positional. Pain lasted for 10 minutes. Multiple episodes prior to that. Pain is less severe from 2015. Overall stable - never comes on at rest, only comes on with exertion.  - compliant with meds. Metoprolol just once daily.     2. Hyperlipidemia - compliant with statin  3. HTN - compliant with meds  Past Medical History:  Diagnosis Date  . Diabetes mellitus without complication ([place])   . Hyperlipidemia   . Hypertension      No Known Allergies   Current Outpatient Prescriptions  Medication Sig Dispense Refill  . aspirin EC 81 MG tablet Take 81 mg by mouth.    Marland Kitchen atorvastatin (LIPITOR) 80 MG tablet Take 0.5 tablets (40 mg total) by mouth daily at 6 PM. 90 tablet 1  . isosorbide mononitrate (IMDUR) 30 MG 24 hr tablet Take 1 tablet (30 mg total) by mouth daily. 90 tablet 1  . linagliptin (TRADJENTA) 5 MG TABS tablet Take 1 tablet (5 mg total) by mouth daily. 90 tablet 1  . lisinopril (PRINIVIL,ZESTRIL) 10 MG tablet Take 1 tablet (10 mg total) by mouth daily. 90 tablet 2  . metFORMIN (GLUCOPHAGE) 1000 MG tablet Take 1 tablet (1,000 mg total) by mouth 2 (two) times daily with a meal. (Patient taking differently: Take 1,000 mg by mouth daily with breakfast. ) 180 tablet 1  . metoprolol tartrate (LOPRESSOR) 25 MG tablet Take 1 tablet (25 mg total) by mouth daily. 90 tablet 1  . UNABLE TO FIND Vit C 2000 daily     No current facility-administered medications for this visit.      Past Surgical History:    Procedure Laterality Date  . SKIN BIOPSY    . TUMOR EXCISION     left knee area     No Known Allergies    Family History  Problem Relation Age of Onset  . Diabetes Mother   . Diabetes Father      Social History [name] reports that he has been smoking.  He has never used smokeless tobacco. [name] reports that he drinks alcohol.   Review of Systems CONSTITUTIONAL: No weight loss, fever, chills, weakness or fatigue.  HEENT: Eyes: No visual loss, blurred vision, double vision or yellow sclerae.No hearing loss, sneezing, congestion, runny nose or sore throat.  SKIN: No rash or itching.  CARDIOVASCULAR: per hpi RESPIRATORY: No shortness of breath, cough or sputum.  GASTROINTESTINAL: No anorexia, nausea, vomiting or diarrhea. No abdominal pain or blood.  GENITOURINARY: No burning on urination, no polyuria NEUROLOGICAL: No headache, dizziness, syncope, paralysis, ataxia, numbness or tingling in the extremities. No change in bowel or bladder control.  MUSCULOSKELETAL: No muscle, back pain, joint pain or stiffness.  LYMPHATICS: No enlarged nodes. No history of splenectomy.  PSYCHIATRIC: No history of depression or anxiety.  ENDOCRINOLOGIC: No reports of sweating, cold or heat intolerance. No polyuria or polydipsia.  Marland Kitchen   Physical Examination Vitals:   01/22/16 1512  BP: 122/80  Pulse: (!) 102   Vitals:   01/22/16 1512  Weight:  297 lb (134.7 kg)  Height: 5\' 10"  (1.778 m)    Gen: resting comfortably, no acute distress HEENT: no scleral icterus, pupils equal round and reactive, no palptable cervical adenopathy,  CV: RRR, no m/r/g, no jvd Resp: Clear to auscultation bilaterally GI: abdomen is soft, non-tender, non-distended, normal bowel sounds, no hepatosplenomegaly MSK: extremities are warm, no edema.  Skin: warm, no rash Neuro:  no focal deficits Psych: appropriate affect   Diagnostic Studies 01/2014 cath [facility] FINDINGS: 1. Left Main: Widely patent. 2.  LAD: The LAD is occluded in the proximal region.The mid and distal LAD is supplied by collateral flow from the circumflex and from the right coronary artery 3. Circumflex: Widely patent. There is a small first obtuse marginal artery and a large second obtuse marginal artery. The distal circumflex trifurcates. 4. Right Coronary Artery: There is 20-30% stenosis in the proximal and mid RCA. The RCA is dominant. The PDA and PLV branches are widely patent. 5. Hemodynamics: Aortic pressure is 141/70 mmHg LV pressure is 135/10 mmHg.   LEFT VENTRICULOGRAPHY: The ejection fraction is about 55%. Wall motion is normal.  CONCLUSIONS: 1. Occluded LAD with left to left and right to left collaterals flow. 2. Normal LV function. 3. He will be discharged home on aspirin, metoprolol, Imdur, Plavix, and a statin. He will followup with Dr. Hamilton [name] in 2-4 weeks     Assessment and Plan  1. CAD - recent episodes of chest pain. Fairly similar to symptoms he has had ongoing for several years. EKG shows sinus tachycardia no acute ischemic changes - due to symptoms and DOT requirements, we will obtain an exercise nuclear stress test.   2. Hyperlipidemia - request labs from pcp - continue statin  3. HTN - at goal, continue current meds        Carl [name], M.D.

## 2016-01-22 NOTE — Patient Instructions (Addendum)
Medication Instructions:   Your physician has recommended you make the following change in your medication:   Stop lopressor (metoprolol tartrate).  Start toprol xl (metoprolol succinate) 25 mg daily.  Continue all other medications the same.  Labwork: NONE  Testing/Procedures: Your physician has requested that you have en exercise stress myoview. For further information please visit [email or web address]. Please follow instruction sheet, as given.  Follow-Up:  Your physician recommends that you schedule a follow-up appointment in: 4 months. You will receive a reminder letter in the mail in about 2 months reminding you to call and schedule your appointment. If you don't receive this letter, please contact our office.  Any Other Special Instructions Will Be Listed Below (If Applicable).  If you need a refill on your cardiac medications before your next appointment, please call your pharmacy.

## 2016-01-22 NOTE — Telephone Encounter (Signed)
    Please call the pt and tell the pt the [place]  showed OSA   Pt stops breathing 24   times an hour.   Home sleep study was done on : 01/13/16  Please order autoCPAP 5-15 cm H2O. Patient will need a mask fitting session. Patient will need a 1 month download.   Patient needs to be seen by me or any of the NPs/APPs  4-6 weeks after obtaining the cpap machine. Let me know if you receive this.   Thanks!   J. Shirl Harris, MD 01/22/2016, 11:25 PM

## 2016-01-22 NOTE — Assessment & Plan Note (Signed)
Admits to not taking his second metformin during the afternoon. He says it is very challenging to do with his schedule.

## 2016-01-22 NOTE — Progress Notes (Signed)
BP 122/77   Pulse 90   Temp (!) 96.8 F (36 C) (Oral)   Ht 5\' 11"  (1.803 m)   Wt 296 lb 6 oz (134.4 kg)   BMI 41.34 kg/m    Subjective:    Patient ID: Carl [name], male    DOB: 1975-06-01, 40 y.o.   MRN: [id number]  HPI: Carl [name] is a 40 y.o. male presenting on 01/22/2016 for Hyperlipidemia (3 month followup); Hypertension; Diabetes; and Discuss Lisinopril (patient has not taken in several months, you had started him on this in November 2016 when his microalbumin was 76.  Unsure if he was to continue)   HPI Hypertension Patient is coming in for a blood pressure recheck. His blood pressure today is 122/77. He is currently on metoprolol and Imdur, during his last visit he was started on lisinopril for microalbuminuria. There was confusion on his part about the lisinopril and he did not get it refilled and continue it. Patient denies headaches, blurred vision, chest pains, shortness of breath, or weakness. Denies any side effects from medication and is content with current medication.   Type 2 diabetes Patient is coming in for recheck of his type 2 diabetes. He is currently on Tradjenta and metformin and his last hemoglobin A1c in August 2017 was 6.5. He is due for recheck again today. He does not check his blood sugars regularly because it is not needed but he has not had any issues with either of the medications. He started to develop some microalbumin in urine in 2016 and was started on lisinopril but he had some confusion about whether or not he was supposed to keep taking it and did not continue it. He denies any new issues with his feet. He saw an ophthalmologist in December 2016 so he will be due this coming month for a visit again.  Hyperlipidemia Patient is currently on Lipitor 80 mg but only takes a half pill of 40 mg currently. He denies any issues with the medication currently. He denies any focal numbness or weakness per  Relevant past medical, surgical, family and  social history reviewed and updated as indicated. Interim medical history since our last visit reviewed. Allergies and medications reviewed and updated.  Review of Systems  Constitutional: Negative for chills and fever.  Eyes: Negative for discharge.  Respiratory: Negative for shortness of breath and wheezing.   Cardiovascular: Negative for chest pain and leg swelling.  Musculoskeletal: Negative for back pain and gait problem.  Skin: Negative for rash.  Neurological: Negative for dizziness, speech difficulty, weakness, numbness and headaches.  All other systems reviewed and are negative.   Per HPI unless specifically indicated above     Objective:    BP 122/77   Pulse 90   Temp (!) 96.8 F (36 C) (Oral)   Ht 5\' 11"  (1.803 m)   Wt 296 lb 6 oz (134.4 kg)   BMI 41.34 kg/m   Wt Readings from Last 3 Encounters:  01/22/16 296 lb 6 oz (134.4 kg)  12/19/15 296 lb 3.2 oz (134.4 kg)  10/10/15 291 lb 3.2 oz (132.1 kg)    Physical Exam  Constitutional: He is oriented to person, place, and time. He appears well-developed and well-nourished. No distress.  Eyes: Conjunctivae are normal. Right eye exhibits no discharge. Left eye exhibits no discharge. No scleral icterus.  Cardiovascular: Normal rate, regular rhythm, normal heart sounds and intact distal pulses.   No murmur heard. Pulmonary/Chest: [place] normal and breath sounds  normal. No respiratory distress. He has no wheezes.  Musculoskeletal: Normal range of motion. He exhibits no edema.  Neurological: He is alert and oriented to person, place, and time. Coordination normal.  Skin: Skin is warm and dry. No rash noted. He is not diaphoretic.  Psychiatric: He has a normal mood and affect. His behavior is normal.  Nursing note and vitals reviewed.     Assessment & Plan:   Problem List Items Addressed This Visit      Cardiovascular and Mediastinum   Essential hypertension, benign - Primary   Relevant Medications   lisinopril  (PRINIVIL,ZESTRIL) 10 MG tablet     Endocrine   Uncontrolled type 2 diabetes mellitus without complication, without long-term current use of insulin ([place])    Admits to not taking his second metformin during the afternoon. He says it is very challenging to do with his schedule.      Relevant Medications   lisinopril (PRINIVIL,ZESTRIL) 10 MG tablet   Other Relevant Orders   Bayer DCA Hb A1c Waived (Completed)     Other   Hyperlipidemia LDL goal <130   Relevant Medications   lisinopril (PRINIVIL,ZESTRIL) 10 MG tablet    Other Visit Diagnoses    Microalbuminuria       Relevant Medications   lisinopril (PRINIVIL,ZESTRIL) 10 MG tablet       Follow up plan: Return in about 3 months (around 04/22/2016), or if symptoms worsen or fail to improve, for Diabetes and hypertension recheck.  Counseling provided for all of the vaccine components Orders Placed This Encounter  Procedures  . Bayer [facility] Hb A1c Waived    Caryl Pina, MD [facility] 01/22/2016, 10:26 AM

## 2016-01-23 NOTE — Telephone Encounter (Signed)
Tried to call pt. But vm not set up yet x1

## 2016-01-27 DIAGNOSIS — G4733 Obstructive sleep apnea (adult) (pediatric): Secondary | ICD-10-CM | POA: Diagnosis not present

## 2016-01-27 NOTE — Telephone Encounter (Signed)
Tried to call pt. But no vm set up x2

## 2016-01-28 ENCOUNTER — Other Ambulatory Visit: Payer: Self-pay | Admitting: *Deleted

## 2016-01-28 DIAGNOSIS — G4733 Obstructive sleep apnea (adult) (pediatric): Secondary | ICD-10-CM

## 2016-01-31 ENCOUNTER — Encounter: Payer: Self-pay | Admitting: Family Medicine

## 2016-01-31 ENCOUNTER — Ambulatory Visit: Payer: BLUE CROSS/BLUE SHIELD | Admitting: Family Medicine

## 2016-01-31 ENCOUNTER — Ambulatory Visit (INDEPENDENT_AMBULATORY_CARE_PROVIDER_SITE_OTHER): Payer: BLUE CROSS/BLUE SHIELD | Admitting: Family Medicine

## 2016-01-31 ENCOUNTER — Ambulatory Visit: Payer: BLUE CROSS/BLUE SHIELD | Admitting: Adult Health

## 2016-01-31 VITALS — BP 127/87 | HR 103 | Temp 97.6°F | Ht 70.0 in | Wt 292.5 lb

## 2016-01-31 DIAGNOSIS — D229 Melanocytic nevi, unspecified: Secondary | ICD-10-CM

## 2016-01-31 MED ORDER — LINAGLIPTIN-METFORMIN HCL ER 5-1000 MG PO TB24: 1.0000 | ORAL_TABLET | Freq: Every day | ORAL | 2 refills | Status: DC

## 2016-01-31 MED ORDER — METOPROLOL SUCCINATE ER 25 MG PO TB24: 25.0000 mg | ORAL_TABLET | Freq: Every day | ORAL | 3 refills | Status: DC

## 2016-01-31 NOTE — Telephone Encounter (Signed)
Spoke with pt and advised of sleep study results per Dr Corrie Dandy. Cpap order placed. Pt verbalized understanding and will call for f/u appt 4-6 weeks after starting cpap.

## 2016-01-31 NOTE — Progress Notes (Signed)
   BP 127/87   Pulse (!) 103   Temp 97.6 F (36.4 C) (Oral)   Ht 5\' 10"  (1.778 m)   Wt 292 lb 8 oz (132.7 kg)   BMI 41.97 kg/m    Subjective:    Patient ID: Carl [name], male    DOB: 1975/07/26, 40 y.o.   MRN: [id number]  HPI: Carl [name] is a 40 y.o. 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 He wants to get off and he also wants to know what it is. It has not changed in size except for the bleeding in quite some time.  Relevant past medical, surgical, family and social history reviewed and updated as indicated. Interim medical history since our last visit reviewed. Allergies and medications reviewed and updated.  Review of Systems  Constitutional: Negative for chills and fever.  Respiratory: Negative for shortness of breath and wheezing.   Cardiovascular: Negative for chest pain and leg swelling.  Musculoskeletal: Negative for back pain and gait problem.  Skin: Negative for rash.  All other systems reviewed and are negative.   Per HPI unless specifically indicated above        Objective:    BP 127/87   Pulse (!) 103   Temp 97.6 F (36.4 C) (Oral)   Ht 5\' 10"  (1.778 m)   Wt 292 lb 8 oz (132.7 kg)   BMI 41.97 kg/m   Wt Readings from Last 3 Encounters:  01/31/16 292 lb 8 oz (132.7 kg)  01/22/16 297 lb (134.7 kg)  01/22/16 296 lb 6 oz (134.4 kg)    Physical Exam  Constitutional: He is oriented to person, place, and time. He appears well-developed and well-nourished. No distress.  Eyes: Conjunctivae are normal.  Musculoskeletal: Normal range of motion. He exhibits no edema.  Neurological: He is alert and oriented to person, place, and time. Coordination normal.  Skin: Skin is warm and dry. Lesion noted. No  rash noted. He is not diaphoretic.     Psychiatric: He has a normal mood and affect. His behavior is normal.  Nursing note and vitals reviewed.  Skin lesion removal:  2% lidocaine with epinephrine was used for local anesthesia, 74mL. Shave biopsy performed and fatty tumor found underneath the skin and that was removed. Both sent to pathology. Silver nitrate applied and hemostasis achieved. Minimal bleeding Procedure was tolerated well     Assessment & Plan:   Problem List Items Addressed This Visit    None    Visit Diagnoses    Atypical nevus    -  Primary   Relevant Orders   Pathology       Follow up plan: Return if symptoms worsen or fail to improve.  Counseling provided for all of the vaccine components No orders of the defined types were placed in this encounter.   Caryl Pina, MD [facility] 01/31/2016, 3:22 PM

## 2016-02-06 LAB — PATHOLOGY

## 2016-02-07 ENCOUNTER — Encounter (HOSPITAL_COMMUNITY): Payer: BLUE CROSS/BLUE SHIELD

## 2016-02-07 ENCOUNTER — Inpatient Hospital Stay (HOSPITAL_COMMUNITY): Admission: RE | Admit: 2016-02-07 | Payer: BLUE CROSS/BLUE SHIELD | Source: Ambulatory Visit

## 2016-02-11 ENCOUNTER — Telehealth: Payer: Self-pay | Admitting: *Deleted

## 2016-02-11 NOTE — Telephone Encounter (Signed)
Pt aware.

## 2016-02-11 NOTE — Telephone Encounter (Signed)
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

## 2016-02-28 ENCOUNTER — Encounter (HOSPITAL_COMMUNITY)
Admission: RE | Admit: 2016-02-28 | Discharge: 2016-02-28 | Disposition: A | Discharge status: Home / Self Care | Payer: BLUE CROSS/BLUE SHIELD | Source: Ambulatory Visit | Attending: Cardiology | Admitting: Cardiology

## 2016-02-28 ENCOUNTER — Encounter (HOSPITAL_COMMUNITY): Payer: Self-pay

## 2016-02-28 ENCOUNTER — Inpatient Hospital Stay (HOSPITAL_COMMUNITY): Admission: RE | Admit: 2016-02-28 | Payer: BLUE CROSS/BLUE SHIELD | Source: Ambulatory Visit

## 2016-02-28 DIAGNOSIS — R079 Chest pain, unspecified: Secondary | ICD-10-CM | POA: Insufficient documentation

## 2016-02-28 LAB — NM MYOCAR MULTI W/SPECT W/WALL MOTION / EF
CHL CUP NUCLEAR SSS: 23
Estimated workload: 10.1 METS
Exercise duration (min): 7 min
Exercise duration (sec): 21 s
LHR: 0.37
LVDIAVOL: 127 mL (ref 62–150)
LVSYSVOL: 66 mL
MPHR: 178 {beats}/min
NUC STRESS TID: 1.21
Peak HR: 166 {beats}/min
Percent HR: 93 %
RPE: 14
Rest HR: 86 {beats}/min
SDS: 10
SRS: 13

## 2016-02-28 MED ORDER — SODIUM CHLORIDE 0.9% FLUSH
INTRAVENOUS | Status: AC
  Administered 2016-02-28: 10 mL via INTRAVENOUS
  Filled 2016-02-28: qty 10

## 2016-02-28 MED ORDER — TECHNETIUM TC 99M TETROFOSMIN IV KIT
30.0000 | PACK | Freq: Once | INTRAVENOUS | Status: AC | PRN
  Administered 2016-02-28: 30 via INTRAVENOUS

## 2016-02-28 MED ORDER — REGADENOSON 0.4 MG/5ML IV SOLN
INTRAVENOUS | Status: AC
  Filled 2016-02-28: qty 5

## 2016-02-28 MED ORDER — TECHNETIUM TC 99M TETROFOSMIN IV KIT
10.0000 | PACK | Freq: Once | INTRAVENOUS | Status: AC | PRN
  Administered 2016-02-28: 10.14 via INTRAVENOUS

## 2016-03-02 ENCOUNTER — Telehealth: Payer: Self-pay | Admitting: *Deleted

## 2016-03-02 NOTE — Telephone Encounter (Signed)
Notes Recorded by Laurine Blazer, LPN on [id number] at 075-GRM PM EST Patient notified. Copy to pmd. Appointment scheduled for Friday, 03/06/16 at 4:20 - added per Dr. Harl Bowie. ------  Notes Recorded by Arnoldo Lenis, MD on 03/02/2016 at 12:49 PM EST Stress test suggests some possible new blockages, can he come in Wed at 340pm to discuss in more detail

## 2016-03-03 ENCOUNTER — Ambulatory Visit (INDEPENDENT_AMBULATORY_CARE_PROVIDER_SITE_OTHER): Payer: BLUE CROSS/BLUE SHIELD | Admitting: Cardiology

## 2016-03-03 ENCOUNTER — Encounter: Payer: Self-pay | Admitting: Cardiology

## 2016-03-03 VITALS — BP 106/72 | HR 109 | Ht 71.0 in | Wt 297.2 lb

## 2016-03-03 DIAGNOSIS — I2510 Atherosclerotic heart disease of native coronary artery without angina pectoris: Secondary | ICD-10-CM

## 2016-03-03 DIAGNOSIS — R079 Chest pain, unspecified: Secondary | ICD-10-CM | POA: Diagnosis not present

## 2016-03-03 MED ORDER — ATORVASTATIN CALCIUM 80 MG PO TABS: 80.0000 mg | ORAL_TABLET | Freq: Every day | ORAL | 3 refills | Status: DC

## 2016-03-03 NOTE — Patient Instructions (Signed)
Your physician recommends that you schedule a follow-up appointment [facility] physician has recommended you make the following change in your medication:   INCREASE LIPITOR 68 MG DAILY  Your physician has requested that you have an echocardiogram. Echocardiography is a painless test that uses sound waves to create images of your heart. It provides your doctor with information about the size and shape of your heart and how well your heart's chambers and valves are working. This procedure takes approximately one hour. There are no restrictions for this procedure.  Thank you for choosing [facility]!!

## 2016-03-03 NOTE — Progress Notes (Signed)
Clinical Summary [name] is a 41 y.o.male seen today for focus follow up of the following medical problems. This is a focused visit on previos episode of chest pain.   1. CAD - abnormal exericse stress in 2015. - cath 2015 at [facility] with occluded proximal LAD, LAD filled with left-left and right-left collaterals. Medically managed - reports recent chest pain on Thanksgiving night. Pressure while walkg, midchest. 3/10 in severity. No other associated symptoms. Not positional. Pain lasted for 10 minutes. Multiple episodes prior to that. Pain is less severe from 2015. Overall stable   - since last visit no recurrent symptoms. Stress test with some evidence of ischemia - denies any repeat episodes.   Past Medical History:  Diagnosis Date  . Diabetes mellitus without complication ([place])   . Hyperlipidemia   . Hypertension      No Known Allergies   Current Outpatient Prescriptions  Medication Sig Dispense Refill  . aspirin EC 81 MG tablet Take 81 mg by mouth.    Marland Kitchen atorvastatin (LIPITOR) 80 MG tablet Take 0.5 tablets (40 mg total) by mouth daily at 6 PM. 90 tablet 1  . isosorbide mononitrate (IMDUR) 30 MG 24 hr tablet Take 1 tablet (30 mg total) by mouth daily. 90 tablet 1  . linagliptin (TRADJENTA) 5 MG TABS tablet Take 1 tablet (5 mg total) by mouth daily. 90 tablet 1  . Linagliptin-Metformin HCl ER 06-998 MG TB24 Take 1 tablet by mouth daily. 90 tablet 2  . lisinopril (PRINIVIL,ZESTRIL) 10 MG tablet Take 1 tablet (10 mg total) by mouth daily. 90 tablet 2  . metFORMIN (GLUCOPHAGE) 1000 MG tablet Take 1 tablet (1,000 mg total) by mouth 2 (two) times daily with a meal. (Patient taking differently: Take 1,000 mg by mouth daily with breakfast. ) 180 tablet 1  . metoprolol succinate (TOPROL XL) 25 MG 24 hr tablet Take 1 tablet (25 mg total) by mouth daily. 90 tablet 3  . UNABLE TO FIND Vit C 2000 daily     No current facility-administered medications for this visit.      Past  Surgical History:  Procedure Laterality Date  . SKIN BIOPSY    . TUMOR EXCISION     left knee area     No Known Allergies    Family History  Problem Relation Age of Onset  . Diabetes Mother   . Diabetes Father      Social History Mr. Ebersole reports that he has been smoking Cigarettes.  He started smoking about 23 years ago. He has been smoking about 2.50 packs per day. He has never used smokeless tobacco. Mr. Sculthorpe reports that he drinks alcohol.   Review of Systems CONSTITUTIONAL: No weight loss, fever, chills, weakness or fatigue.  HEENT: Eyes: No visual loss, blurred vision, double vision or yellow sclerae.No hearing loss, sneezing, congestion, runny nose or sore throat.  SKIN: No rash or itching.  CARDIOVASCULAR: per hpi RESPIRATORY: No shortness of breath, cough or sputum.  GASTROINTESTINAL: No anorexia, nausea, vomiting or diarrhea. No abdominal pain or blood.  GENITOURINARY: No burning on urination, no polyuria NEUROLOGICAL: No headache, dizziness, syncope, paralysis, ataxia, numbness or tingling in the extremities. No change in bowel or bladder control.  MUSCULOSKELETAL: No muscle, back pain, joint pain or stiffness.  LYMPHATICS: No enlarged nodes. No history of splenectomy.  PSYCHIATRIC: No history of depression or anxiety.  ENDOCRINOLOGIC: No reports of sweating, cold or heat intolerance. No polyuria or polydipsia.  Marland Kitchen   Physical  Examination Vitals:   03/03/16 1507  BP: 106/72  Pulse: (!) 109   Vitals:   03/03/16 1507  Weight: 297 lb 3.2 oz (134.8 kg)  Height: 5\' 11"  (1.803 m)    Gen: resting comfortably, no acute distress HEENT: no scleral icterus, pupils equal round and reactive, no palptable cervical adenopathy,  CV: per hpi Resp: Clear to auscultation bilaterally GI: abdomen is soft, non-tender, non-distended, normal bowel sounds, no hepatosplenomegaly MSK: extremities are warm, no edema.  Skin: warm, no rash Neuro:  no focal deficits Psych:  appropriate affect   Diagnostic Studies 01/2014 cath [facility] FINDINGS: 1. Left Main: Widely patent. 2. LAD: The LAD is occluded in the proximal region.The mid and distal LAD is supplied by collateral flow from the circumflex and from the right coronary artery 3. Circumflex: Widely patent. There is a small first obtuse marginal artery and a large second obtuse marginal artery. The distal circumflex trifurcates. 4. Right Coronary Artery: There is 20-30% stenosis in the proximal and mid RCA. The RCA is dominant. The PDA and PLV branches are widely patent. 5. Hemodynamics: Aortic pressure is 141/70 mmHg LV pressure is 135/10 mmHg.   LEFT VENTRICULOGRAPHY: The ejection fraction is about 55%. Wall motion is normal.  CONCLUSIONS: 1. Occluded LAD with left to left and right to left collaterals flow. 2. Normal LV function. 3. He will be discharged home on aspirin, metoprolol, Imdur, Plavix, and a statin. He will followup with Dr. Hamilton Capri in 2-4 weeks    Jan 2018 Nuclear stress  Blood pressure demonstrated a normal response to exercise. Nonlimiting exercise induced chest pain.  There was no ST segment deviation noted during stress. Duke treadmill score is 3, moderate risk  Findings consistent with prior apical and distal anterolateralmyocardial infarction with significant peri-infarct ischemia.  The left ventricular ejection fraction is mildly decreased (45-54%).  Overall moderate to high risk findings based on Duke treadmill score, mildly decreased LVEF, and ischemia by imaging.    Assessment and Plan  1. CAD - isolated episode of chest pain, no recurrent symptoms - stress test with evidence of ischemia. Prior cath did show LAD disease with collaterals.  - in absence of recurrent symptoms we will manage medically at this time.  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  - obtain echo given suggestion of low LVEF by nuclear stress      Arnoldo Lenis, M.D.

## 2016-03-06 ENCOUNTER — Ambulatory Visit: Payer: BLUE CROSS/BLUE SHIELD | Admitting: Cardiology

## 2016-03-25 ENCOUNTER — Other Ambulatory Visit: Payer: Self-pay

## 2016-03-25 ENCOUNTER — Ambulatory Visit (INDEPENDENT_AMBULATORY_CARE_PROVIDER_SITE_OTHER): Payer: BLUE CROSS/BLUE SHIELD

## 2016-03-25 DIAGNOSIS — R079 Chest pain, unspecified: Secondary | ICD-10-CM | POA: Diagnosis not present

## 2016-04-24 ENCOUNTER — Ambulatory Visit (INDEPENDENT_AMBULATORY_CARE_PROVIDER_SITE_OTHER): Payer: BLUE CROSS/BLUE SHIELD | Admitting: Cardiology

## 2016-04-24 ENCOUNTER — Encounter: Payer: Self-pay | Admitting: Cardiology

## 2016-04-24 ENCOUNTER — Ambulatory Visit: Payer: BLUE CROSS/BLUE SHIELD | Admitting: Family Medicine

## 2016-04-24 VITALS — BP 129/83 | HR 76 | Ht 71.0 in | Wt 292.0 lb

## 2016-04-24 DIAGNOSIS — R0789 Other chest pain: Secondary | ICD-10-CM

## 2016-04-24 DIAGNOSIS — I2510 Atherosclerotic heart disease of native coronary artery without angina pectoris: Secondary | ICD-10-CM | POA: Diagnosis not present

## 2016-04-24 MED ORDER — ISOSORBIDE MONONITRATE ER 60 MG PO TB24: 60.0000 mg | ORAL_TABLET | Freq: Every day | ORAL | 3 refills | Status: DC

## 2016-04-24 MED ORDER — ISOSORBIDE MONONITRATE ER 60 MG PO TB24: 60.0000 mg | ORAL_TABLET | Freq: Every day | ORAL | 6 refills | Status: DC

## 2016-04-24 NOTE — Progress Notes (Signed)
Clinical Summary Carl [name] is a 41 y.o.male seen today for follow up of the following medical problems. This is a focused visit on his history of CAD and recent chest pain symptoms.   1. CAD - abnormal exericse stress in 2015. - cath 2015 at [facility] with occluded proximal LAD, LAD filled with left-left and right-left collaterals. Medically managed - reports recent chest pain on Thanksgiving night. Pressure while walkg, midchest. 3/10 in severity. No other associated symptoms. Not positional. Pain lasted for 10 minutes. Multiple episodes prior to that. Pain is less severe from 2015. Overall stable - echo Jan 2018 LVEF 55-60%  - has had some recent chest pain symptoms. Sharp or dull pain in midchest, 2/10 in severity. No other assoiciated symptoms. Mainly occurs with activity, example walking. Not positional. Resolves with deep breathing.   Past Medical History:  Diagnosis Date  . Diabetes mellitus without complication ([place])   . Hyperlipidemia   . Hypertension      No Known Allergies   Current Outpatient Prescriptions  Medication Sig Dispense Refill  . aspirin EC 81 MG tablet Take 81 mg by mouth.    Marland Kitchen atorvastatin (LIPITOR) 80 MG tablet Take 1 tablet (80 mg total) by mouth daily. 90 tablet 3  . isosorbide mononitrate (IMDUR) 30 MG 24 hr tablet Take 1 tablet (30 mg total) by mouth daily. 90 tablet 1  . Linagliptin-Metformin HCl ER 06-998 MG TB24 Take 1 tablet by mouth daily. 90 tablet 2  . lisinopril (PRINIVIL,ZESTRIL) 10 MG tablet Take 1 tablet (10 mg total) by mouth daily. 90 tablet 2  . metoprolol succinate (TOPROL XL) 25 MG 24 hr tablet Take 1 tablet (25 mg total) by mouth daily. 90 tablet 3  . UNABLE TO FIND Vit C 2000 daily     No current facility-administered medications for this visit.      Past Surgical History:  Procedure Laterality Date  . SKIN BIOPSY    . TUMOR EXCISION     left knee area     No Known Allergies    Family History  Problem Relation  Age of Onset  . Diabetes Mother   . Diabetes Father      Social History Carl [name] reports that he has been smoking Cigarettes.  He started smoking about 23 years ago. He has been smoking about 2.50 packs per day. He has never used smokeless tobacco. Carl [name] reports that he drinks alcohol.   Review of Systems CONSTITUTIONAL: No weight loss, fever, chills, weakness or fatigue.  HEENT: Eyes: No visual loss, blurred vision, double vision or yellow sclerae.No hearing loss, sneezing, congestion, runny nose or sore throat.  SKIN: No rash or itching.  CARDIOVASCULAR: per hpi RESPIRATORY: No shortness of breath, cough or sputum.  GASTROINTESTINAL: No anorexia, nausea, vomiting or diarrhea. No abdominal pain or blood.  GENITOURINARY: No burning on urination, no polyuria NEUROLOGICAL: No headache, dizziness, syncope, paralysis, ataxia, numbness or tingling in the extremities. No change in bowel or bladder control.  MUSCULOSKELETAL: No muscle, back pain, joint pain or stiffness.  LYMPHATICS: No enlarged nodes. No history of splenectomy.  PSYCHIATRIC: No history of depression or anxiety.  ENDOCRINOLOGIC: No reports of sweating, cold or heat intolerance. No polyuria or polydipsia.  Marland Kitchen   Physical Examination Vitals:   04/24/16 1322  BP: 129/83  Pulse: 76   Vitals:   04/24/16 1322  Weight: 292 lb (132.5 kg)  Height: 5\' 11"  (1.803 m)    Gen: resting comfortably, no  acute distress HEENT: no scleral icterus, pupils equal round and reactive, no palptable cervical adenopathy,  CV: RRR, no m/r/g, no jvd Resp: Clear to auscultation bilaterally GI: abdomen is soft, non-tender, non-distended, normal bowel sounds, no hepatosplenomegaly MSK: extremities are warm, no edema.  Skin: warm, no rash Neuro:  no focal deficits Psych: appropriate affect   Diagnostic Studies 01/2014 cath [facility] FINDINGS: 1. Left Main: Widely patent. 2. LAD: The LAD is occluded in the proximal region.The mid and  distal LAD is supplied by collateral flow from the circumflex and from the right coronary artery 3. Circumflex: Widely patent. There is a small first obtuse marginal artery and a large second obtuse marginal artery. The distal circumflex trifurcates. 4. Right Coronary Artery: There is 20-30% stenosis in the proximal and mid RCA. The RCA is dominant. The PDA and PLV branches are widely patent. 5. Hemodynamics: Aortic pressure is 141/70 mmHg LV pressure is 135/10 mmHg.   LEFT VENTRICULOGRAPHY: The ejection fraction is about 55%. Wall motion is normal.  CONCLUSIONS: 1. Occluded LAD with left to left and right to left collaterals flow. 2. Normal LV function. 3. He will be discharged home on aspirin, metoprolol, Imdur, Plavix, and a statin. He will followup with Dr. Hamilton Capri in 2-4 weeks    Jan 2018 Nuclear stress  Blood pressure demonstrated a normal response to exercise. Nonlimiting exercise induced chest pain.  There was no ST segment deviation noted during stress. Duke treadmill score is 3, moderate risk  Findings consistent with prior apical and distal anterolateralmyocardial infarction with significant peri-infarct ischemia.  The left ventricular ejection fraction is mildly decreased (45-54%).  Overall moderate to high risk findings based on Duke treadmill score, mildly decreased LVEF, and ischemia by imaging.    Jan 2018 echo Study Conclusions  - Left ventricle: The cavity size was normal. Wall thickness was   increased in a pattern of mild LVH. Systolic function was normal.   The estimated ejection fraction was in the range of 55% to 60%.   Wall motion was normal; there were no regional wall motion   abnormalities. Left ventricular diastolic function parameters   were normal. - Aortic valve: Valve area (VTI): 3.41 cm^2. Valve area (Vmax):   2.78 cm^2. Valve area (Vmean): 2.87 cm^2. - Technically adequate study.  Assessment and Plan  1. CAD - mild stable exertional  chest pain symptoms.  - we will continue medical therapy at this time, increase imdur to 60mg  daily. If progression of symptoms or refractory to medical therapy consider cath at that time.   F/u 3 months    Arnoldo Lenis, M.D.

## 2016-04-24 NOTE — Patient Instructions (Signed)
Medication Instructions:  Increase Imdur to 60mg daily  Continue all other medications.     Labwork: none  Testing/Procedures: none  Follow-Up: 3 months   Any Other Special Instructions Will Be Listed Below (If Applicable).   If you need a refill on your cardiac medications before your next appointment, please call your pharmacy.  

## 2016-04-27 ENCOUNTER — Encounter: Payer: Self-pay | Admitting: Family Medicine

## 2016-07-13 ENCOUNTER — Telehealth: Payer: Self-pay | Admitting: Cardiovascular Disease

## 2016-07-13 ENCOUNTER — Encounter: Payer: Self-pay | Admitting: Family Medicine

## 2016-07-13 ENCOUNTER — Ambulatory Visit (INDEPENDENT_AMBULATORY_CARE_PROVIDER_SITE_OTHER): Payer: BLUE CROSS/BLUE SHIELD | Admitting: Family Medicine

## 2016-07-13 VITALS — BP 124/79 | HR 78 | Temp 97.4°F | Ht 71.0 in | Wt 294.0 lb

## 2016-07-13 DIAGNOSIS — R0789 Other chest pain: Secondary | ICD-10-CM | POA: Diagnosis not present

## 2016-07-13 DIAGNOSIS — J301 Allergic rhinitis due to pollen: Secondary | ICD-10-CM | POA: Diagnosis not present

## 2016-07-13 DIAGNOSIS — E1165 Type 2 diabetes mellitus with hyperglycemia: Secondary | ICD-10-CM

## 2016-07-13 DIAGNOSIS — IMO0001 Reserved for inherently not codable concepts without codable children: Secondary | ICD-10-CM

## 2016-07-13 LAB — BAYER DCA HB A1C WAIVED: HB A1C: 6 % (ref <7.0)

## 2016-07-13 MED ORDER — FLUTICASONE PROPIONATE 50 MCG/ACT NA SUSP: 1.0000 | Freq: Two times a day (BID) | NASAL | 6 refills | Status: DC | PRN

## 2016-07-13 NOTE — Progress Notes (Signed)
BP 124/79   Pulse 78   Temp 97.4 F (36.3 C) (Oral)   Ht '5\' 11"'  (1.803 m)   Wt 294 lb (133.4 kg)   BMI 41.00 kg/m    Subjective:    Patient ID: Carl [name], male    DOB: 11-May-1975, 41 y.o.   MRN: [id number]  HPI: Carl [name] is a 41 y.o. male presenting on 07/13/2016 for Sinusitis (x 1 week; sneezing, nasal congestion, sinus drainage, dizziness, cough, right ear feels stopped up phlegm is discolored; has tried OTC Equate Allery med)   HPI Chest pain/pressure Patient comes in today with complaints of chest pain/pressure that he had yesterday and lasted about 10 minutes. He described the sensation as a substernal burning/pressure on like he's ever felt before. He says it was partly like his heartburn that he gets sometimes that he also had more of a pressure and then he felt nauseated and felt like he was going to vomit and actually had a: Work sick because of the way he is feeling. It lasted about 10 minutes and then didn't happen again yesterday. He had one episode where he felt nauseated this morning lasted a few minutes but no further chest pressure. He denies any shortness of breath or wheezing.  Sinus congestion and allergies Patient has been having sinus drainage and congestion and postnasal drainage and irritation in his throat and sneezing and coughing and pressure in his right ear has been going on for the past week. He used an over-the-counter allergy medication but has not used anything else. He says it did help a little bit but not completely. He denies any fevers or chills or shortness of breath or wheezing.  Type 2 diabetes and cholesterol recheck Patient missed his last appointment for type 2 diabetes and cholesterol recheck and is coming in asking if he needs to do that today. We will go ahead and check them and then follow up on them in a few months and see where he is at. He says that he does not know where his control is been as low has not been checking very  often.  Relevant past medical, surgical, family and social history reviewed and updated as indicated. Interim medical history since our last visit reviewed. Allergies and medications reviewed and updated.  Review of Systems  Constitutional: Negative for chills and fever.  HENT: Positive for congestion, postnasal drip, rhinorrhea, sinus pressure, sneezing and sore throat. Negative for ear discharge, ear pain and voice change.   Eyes: Negative for pain, discharge, redness and visual disturbance.  Respiratory: Positive for cough. Negative for chest tightness, shortness of breath and wheezing.   Cardiovascular: Positive for chest pain. Negative for leg swelling.  Gastrointestinal: Positive for nausea. Negative for abdominal pain, anal bleeding and vomiting.  Musculoskeletal: Negative for back pain and gait problem.  Skin: Negative for color change and rash.  All other systems reviewed and are negative.   Per HPI unless specifically indicated above        Objective:    BP 124/79   Pulse 78   Temp 97.4 F (36.3 C) (Oral)   Ht '5\' 11"'  (1.803 m)   Wt 294 lb (133.4 kg)   BMI 41.00 kg/m   Wt Readings from Last 3 Encounters:  07/13/16 294 lb (133.4 kg)  04/24/16 292 lb (132.5 kg)  03/03/16 297 lb 3.2 oz (134.8 kg)    Physical Exam  Constitutional: He is oriented to person, place, and time. He appears  well-developed and well-nourished. No distress.  HENT:  Right Ear: Tympanic membrane, external ear and ear canal normal.  Left Ear: Tympanic membrane, external ear and ear canal normal.  Nose: Mucosal edema and rhinorrhea present. No sinus tenderness. No epistaxis. Right sinus exhibits maxillary sinus tenderness. Right sinus exhibits no frontal sinus tenderness. Left sinus exhibits maxillary sinus tenderness. Left sinus exhibits no frontal sinus tenderness.  Mouth/Throat: Uvula is midline and mucous membranes are normal. Posterior oropharyngeal edema and posterior oropharyngeal erythema  present. No oropharyngeal exudate or tonsillar abscesses.  Eyes: Conjunctivae and EOM are normal. Pupils are equal, round, and reactive to light. Right eye exhibits no discharge. No scleral icterus.  Neck: Neck supple. No thyromegaly present.  Cardiovascular: Normal rate, regular rhythm, normal heart sounds and intact distal pulses.   No murmur heard. Pulmonary/Chest: Effort normal and breath sounds normal. No respiratory distress. He has no wheezes. He has no rales. He exhibits no tenderness (No reproducible chest tenderness or abdominal tenderness).  Abdominal: He exhibits no distension. There is no tenderness. There is no rebound.  Musculoskeletal: Normal range of motion. He exhibits no edema.  Lymphadenopathy:    He has no cervical adenopathy.  Neurological: He is alert and oriented to person, place, and time. Coordination normal.  Skin: Skin is warm and dry. No rash noted. He is not diaphoretic.  Psychiatric: He has a normal mood and affect. His behavior is normal.  Nursing note and vitals reviewed.   EKG: Slight progression of partial bundle blanch block, discussed results with patient's cardiologist and they will get him in to see them soon.    Assessment & Plan:   Problem List Items Addressed This Visit      Endocrine   Uncontrolled type 2 diabetes mellitus without complication, without long-term current use of insulin (HCC)   Relevant Medications   atorvastatin (LIPITOR) 40 MG tablet   Other Relevant Orders   Bayer DCA Hb A1c Waived   CMP14+EGFR   Lipid panel    Other Visit Diagnoses    Other chest pain    -  Primary   Relevant Orders   EKG 12-Lead   CMP14+EGFR   Seasonal allergic rhinitis due to pollen          Discussed chest pain and results of EKG with patient's cardiologist and they will get him into their office within the week.  Follow up plan: Return if symptoms worsen or fail to improve.  Counseling provided for all of the vaccine components Orders  Placed This Encounter  Procedures  . Bayer DCA Hb A1c Waived  . CMP14+EGFR  . Lipid panel  . EKG 12-Lead    Caryl Pina, MD [facility] 07/13/2016, 3:21 PM

## 2016-07-13 NOTE — Telephone Encounter (Signed)
Called by Dr. Warrick Parisian at [facility] about this patient of Dr. Harl Bowie, who has known CAD. Dr. Harl Bowie increased Imdur to 60 mg on 04/24/16. Pt complained of chest pressure yesterday and earlier this morning, but none at present. ECG showed sinus tachycardia with nonspecific IVCD. I have made an appt for him to see Dr. Harl Bowie this week, as he mentioned that if the patient had recurrent symptoms, coronary angiography would be considered.

## 2016-07-14 ENCOUNTER — Encounter: Payer: Self-pay | Admitting: Cardiology

## 2016-07-14 ENCOUNTER — Ambulatory Visit (INDEPENDENT_AMBULATORY_CARE_PROVIDER_SITE_OTHER): Payer: BLUE CROSS/BLUE SHIELD | Admitting: Cardiology

## 2016-07-14 VITALS — BP 102/64 | HR 97 | Ht 70.0 in | Wt 293.0 lb

## 2016-07-14 DIAGNOSIS — R079 Chest pain, unspecified: Secondary | ICD-10-CM | POA: Diagnosis not present

## 2016-07-14 DIAGNOSIS — I2510 Atherosclerotic heart disease of native coronary artery without angina pectoris: Secondary | ICD-10-CM

## 2016-07-14 LAB — LIPID PANEL
Chol/HDL Ratio: 4.8 ratio (ref 0.0–5.0)
Cholesterol, Total: 114 mg/dL (ref 100–199)
HDL: 24 mg/dL — ABNORMAL LOW (ref >39)
LDL Calculated: 56 mg/dL (ref 0–99)
Triglycerides: 170 mg/dL — ABNORMAL HIGH (ref 0–149)
VLDL CHOLESTEROL CAL: 34 mg/dL (ref 5–40)

## 2016-07-14 LAB — CMP14+EGFR
A/G RATIO: 1.4 (ref 1.2–2.2)
ALBUMIN: 4.3 g/dL (ref 3.5–5.5)
ALT: 27 IU/L (ref 0–44)
AST: 22 IU/L (ref 0–40)
Alkaline Phosphatase: 77 IU/L (ref 39–117)
BUN / CREAT RATIO: 10 (ref 9–20)
BUN: 11 mg/dL (ref 6–24)
Bilirubin Total: 0.5 mg/dL (ref 0.0–1.2)
CALCIUM: 9.3 mg/dL (ref 8.7–10.2)
CHLORIDE: 102 mmol/L (ref 96–106)
CO2: 25 mmol/L (ref 18–29)
Creatinine, Ser: 1.07 mg/dL (ref 0.76–1.27)
GFR calc non Af Amer: 85 mL/min/{1.73_m2} (ref >59)
GFR, EST AFRICAN AMERICAN: 98 mL/min/{1.73_m2} (ref >59)
Globulin, Total: 3 g/dL (ref 1.5–4.5)
Glucose: 114 mg/dL — ABNORMAL HIGH (ref 65–99)
Potassium: 4.3 mmol/L (ref 3.5–5.2)
Sodium: 140 mmol/L (ref 134–144)
TOTAL PROTEIN: 7.3 g/dL (ref 6.0–8.5)

## 2016-07-14 NOTE — Patient Instructions (Signed)
Medication Instructions:  Your physician recommends that you continue on your current medications as directed. Please refer to the Current Medication list given to you today.   Labwork: NONE  Testing/Procedures: NONE  Follow-Up: Your physician recommends that you schedule a follow-up appointment in: ALREADY SCHEDULED    Any Other Special Instructions Will Be Listed Below (If Applicable).     If you need a refill on your cardiac medications before your next appointment, please call your pharmacy.

## 2016-07-14 NOTE — Progress Notes (Signed)
Clinical Summary Carl [name] is a 41 y.o.male see ntoday for follow up of the following medical problems. This is a focused visit on history of CAD and recent chest pain.   1. CAD - abnormal exericse stress in 2015. - cath 2015 at [facility] with occluded proximal LAD, LAD filled with left-left and right-left collaterals. Medically managed - reports recent chest pain on Thanksgiving night. Pressure while walkg, midchest. 3/10 in severity. No other associated symptoms. Not positional. Pain lasted for 10 minutes. Multiple episodes prior to that. Pain is less severe from 2015. Overall stable - echo Jan 2018 LVEF 55-60%  - has had some recent chest pain symptoms. Sharp or dull pain in midchest, 2/10 in severity. No other assoiciated symptoms. Mainly occurs with activity, example walking. Not positional. Resolves with deep breathing.  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 Dull pressure midchest into throat, 1/10 in severity. Had some nausea, dizziness. Lasted about 30 minutes. Severe nausea. Pain self resolved. Not like any other previous chest pain symptoms.  - over the last few weeks no other symptoms. Has been dealing with allergies, productive cough.  - stable SOB/DOE.      Past Medical History:  Diagnosis Date  . Diabetes mellitus without complication ([place])   . Hyperlipidemia   . Hypertension      No Known Allergies   Current Outpatient Prescriptions  Medication Sig Dispense Refill  . aspirin EC 81 MG tablet Take 81 mg by mouth.    Marland Kitchen atorvastatin (LIPITOR) 40 MG tablet Take 40 mg by mouth daily.    Marland Kitchen atorvastatin (LIPITOR) 80 MG tablet Take 1 tablet (80 mg total) by mouth daily. 90 tablet 3  . fluticasone (FLONASE) 50 MCG/ACT nasal spray Place 1 spray into both nostrils 2 (two) times daily as needed for allergies or rhinitis. 16 g 6  . isosorbide mononitrate (IMDUR) 60 MG 24 hr tablet Take 1 tablet (60 mg  total) by mouth daily. Dose increased 04/24/2016 90 tablet 3  . Linagliptin-Metformin HCl ER 06-998 MG TB24 Take 1 tablet by mouth daily. 90 tablet 2  . lisinopril (PRINIVIL,ZESTRIL) 10 MG tablet Take 1 tablet (10 mg total) by mouth daily. 90 tablet 2  . metoprolol succinate (TOPROL XL) 25 MG 24 hr tablet Take 1 tablet (25 mg total) by mouth daily. 90 tablet 3  . UNABLE TO FIND Vit C 2000 daily     No current facility-administered medications for this visit.      Past Surgical History:  Procedure Laterality Date  . SKIN BIOPSY    . TUMOR EXCISION     left knee area     No Known Allergies    Family History  Problem Relation Age of Onset  . Diabetes Mother   . Diabetes Father      Social History Carl [name] reports that he has been smoking Cigarettes.  He started smoking about 23 years ago. He has been smoking about 2.50 packs per day. He has never used smokeless tobacco. Carl [name] reports that he drinks alcohol.   Review of Systems CONSTITUTIONAL: No weight loss, fever, chills, weakness or fatigue.  HEENT: Eyes: No visual loss, blurred vision, double vision or yellow sclerae.No hearing loss, sneezing, congestion, runny nose or sore throat.  SKIN: No rash or itching.  CARDIOVASCULAR: per hpi RESPIRATORY: No shortness of breath, cough or sputum.  GASTROINTESTINAL: No anorexia, nausea, vomiting or  diarrhea. No abdominal pain or blood.  GENITOURINARY: No burning on urination, no polyuria NEUROLOGICAL: No headache, dizziness, syncope, paralysis, ataxia, numbness or tingling in the extremities. No change in bowel or bladder control.  MUSCULOSKELETAL: No muscle, back pain, joint pain or stiffness.  LYMPHATICS: No enlarged nodes. No history of splenectomy.  PSYCHIATRIC: No history of depression or anxiety.  ENDOCRINOLOGIC: No reports of sweating, cold or heat intolerance. No polyuria or polydipsia.  Marland Kitchen   Physical Examination Vitals:   07/14/16 1345  BP: 102/64  Pulse: 97    Vitals:   07/14/16 1345  Weight: 293 lb (132.9 kg)  Height: 5\' 10"  (1.778 m)    Gen: resting comfortably, no acute distress HEENT: no scleral icterus, pupils equal round and reactive, no palptable cervical adenopathy,  CV: RRR, no m/r/g, no jvd Resp: Clear to auscultation bilaterally GI: abdomen is soft, non-tender, non-distended, normal bowel sounds, no hepatosplenomegaly MSK: extremities are warm, no edema.  Skin: warm, no rash Neuro:  no focal deficits Psych: appropriate affect   Diagnostic Studies 01/2014 cath [facility] FINDINGS: 1. Left Main: Widely patent. 2. LAD: The LAD is occluded in the proximal region.The mid and distal LAD is supplied by collateral flow from the circumflex and from the right coronary artery 3. Circumflex: Widely patent. There is a small first obtuse marginal artery and a large second obtuse marginal artery. The distal circumflex trifurcates. 4. Right Coronary Artery: There is 20-30% stenosis in the proximal and mid RCA. The RCA is dominant. The PDA and PLV branches are widely patent. 5. Hemodynamics: Aortic pressure is 141/70 mmHg LV pressure is 135/10 mmHg.   LEFT VENTRICULOGRAPHY: The ejection fraction is about 55%. Wall motion is normal.  CONCLUSIONS: 1. Occluded LAD with left to left and right to left collaterals flow. 2. Normal LV function. 3. He will be discharged home on aspirin, metoprolol, Imdur, Plavix, and a statin. He will followup with Dr. Hamilton Capri in 2-4 weeks    Jan 2018 Nuclear stress  Blood pressure demonstrated a normal response to exercise. Nonlimiting exercise induced chest pain.  There was no ST segment deviation noted during stress. Duke treadmill score is 3, moderate risk  Findings consistent with prior apical and distal anterolateralmyocardial infarction with significant peri-infarct ischemia.  The left ventricular ejection fraction is mildly decreased (45-54%).  Overall moderate to high risk findings based on  Duke treadmill score, mildly decreased LVEF, and ischemia by imaging.    Jan 2018 echo Study Conclusions  - Left ventricle: The cavity size was normal. Wall thickness was increased in a pattern of mild LVH. Systolic function was normal. The estimated ejection fraction was in the range of 55% to 60%. Wall motion was normal; there were no regional wall motion abnormalities. Left ventricular diastolic function parameters were normal. - Aortic valve: Valve area (VTI): 3.41 cm^2. Valve area (Vmax): 2.78 cm^2. Valve area (Vmean): 2.87 cm^2. - Technically adequate study.    Assessment and Plan  1. CAD -isolated episode of mild somewhat atypical chest pain.  - recent stress test with some ischemia noted, we have been treating medically initially - conitnue medical therapy at this time, if recurrent or progressing symptoms will consider cath         Arnoldo Lenis, M.D.

## 2016-07-31 ENCOUNTER — Ambulatory Visit (INDEPENDENT_AMBULATORY_CARE_PROVIDER_SITE_OTHER): Payer: BLUE CROSS/BLUE SHIELD | Admitting: Cardiology

## 2016-07-31 ENCOUNTER — Encounter: Payer: Self-pay | Admitting: Cardiology

## 2016-07-31 VITALS — BP 110/71 | HR 95 | Ht 71.0 in | Wt 295.0 lb

## 2016-07-31 DIAGNOSIS — I2510 Atherosclerotic heart disease of native coronary artery without angina pectoris: Secondary | ICD-10-CM

## 2016-07-31 NOTE — Progress Notes (Signed)
Clinical Summary Carl [name] is a 41 y.o.male seen today as a focused visit on his history of CAD and chest pain  1. CAD - abnormal exericse stress in 2015. - cath 2015 at [facility] with occluded proximal LAD, LAD filled with left-left and right-left collaterals. Medically managed - reports recent chest pain on Thanksgiving night. Pressure while walkg, midchest. 3/10 in severity. No other associated symptoms. Not positional. Pain lasted for 10 minutes. Multiple episodes prior to that. Pain is less severe from 2015. Overall stable - echo Jan 2018 LVEF 55-60%  - previous visits was having some chest pain - nuclear stress Jan 2018 showed apical and distal anterolateral infarct with ischemia. Overall intermediate to high risk.  - since increasing his imdur he has not had any recurrent chest pain, and thus we have not pursued invasive testing.  - no recent chest pain, SOB or DOE.         Past Medical History:  Diagnosis Date  . Diabetes mellitus without complication ([place])   . Hyperlipidemia   . Hypertension      No Known Allergies   Current Outpatient Prescriptions  Medication Sig Dispense Refill  . aspirin EC 81 MG tablet Take 81 mg by mouth.    Marland Kitchen atorvastatin (LIPITOR) 80 MG tablet Take 80 mg by mouth daily.    . fluticasone (FLONASE) 50 MCG/ACT nasal spray Place 1 spray into both nostrils 2 (two) times daily as needed for allergies or rhinitis. 16 g 6  . isosorbide mononitrate (IMDUR) 60 MG 24 hr tablet Take 1 tablet (60 mg total) by mouth daily. Dose increased 04/24/2016 90 tablet 3  . Linagliptin-Metformin HCl ER 06-998 MG TB24 Take 1 tablet by mouth daily. 90 tablet 2  . lisinopril (PRINIVIL,ZESTRIL) 10 MG tablet Take 1 tablet (10 mg total) by mouth daily. 90 tablet 2  . metoprolol succinate (TOPROL XL) 25 MG 24 hr tablet Take 1 tablet (25 mg total) by mouth daily. 90 tablet 3  . UNABLE TO FIND Vit C 2000 daily     No current facility-administered medications for this  visit.      Past Surgical History:  Procedure Laterality Date  . SKIN BIOPSY    . TUMOR EXCISION     left knee area     No Known Allergies    Family History  Problem Relation Age of Onset  . Diabetes Mother   . Diabetes Father      Social History Mr. Charter reports that he has been smoking Cigarettes.  He started smoking about 23 years ago. He has been smoking about 2.50 packs per day. He has never used smokeless tobacco. Mr. Bullinger reports that he drinks alcohol.   Review of Systems CONSTITUTIONAL: No weight loss, fever, chills, weakness or fatigue.  HEENT: Eyes: No visual loss, blurred vision, double vision or yellow sclerae.No hearing loss, sneezing, congestion, runny nose or sore throat.  SKIN: No rash or itching.  CARDIOVASCULAR: per hpi RESPIRATORY: No shortness of breath, cough or sputum.  GASTROINTESTINAL: No anorexia, nausea, vomiting or diarrhea. No abdominal pain or blood.  GENITOURINARY: No burning on urination, no polyuria NEUROLOGICAL: No headache, dizziness, syncope, paralysis, ataxia, numbness or tingling in the extremities. No change in bowel or bladder control.  MUSCULOSKELETAL: No muscle, back pain, joint pain or stiffness.  LYMPHATICS: No enlarged nodes. No history of splenectomy.  PSYCHIATRIC: No history of depression or anxiety.  ENDOCRINOLOGIC: No reports of sweating, cold or heat intolerance. No polyuria or  polydipsia.  Marland Kitchen   Physical Examination Vitals:   07/31/16 1301  BP: 110/71  Pulse: 95   Vitals:   07/31/16 1301  Weight: 295 lb (133.8 kg)  Height: 5\' 11"  (1.803 m)    Gen: resting comfortably, no acute distress HEENT: no scleral icterus, pupils equal round and reactive, no palptable cervical adenopathy,  CV: RRR, no m/r/g, no jvd Resp: Clear to auscultation bilaterally GI: abdomen is soft, non-tender, non-distended, normal bowel sounds, no hepatosplenomegaly MSK: extremities are warm, no edema.  Skin: warm, no rash Neuro:  no  focal deficits Psych: appropriate affect   Diagnostic Studies 01/2014 cath [facility] FINDINGS: 1. Left Main: Widely patent. 2. LAD: The LAD is occluded in the proximal region.The mid and distal LAD is supplied by collateral flow from the circumflex and from the right coronary artery 3. Circumflex: Widely patent. There is a small first obtuse marginal artery and a large second obtuse marginal artery. The distal circumflex trifurcates. 4. Right Coronary Artery: There is 20-30% stenosis in the proximal and mid RCA. The RCA is dominant. The PDA and PLV branches are widely patent. 5. Hemodynamics: Aortic pressure is 141/70 mmHg LV pressure is 135/10 mmHg.   LEFT VENTRICULOGRAPHY: The ejection fraction is about 55%. Wall motion is normal.  CONCLUSIONS: 1. Occluded LAD with left to left and right to left collaterals flow. 2. Normal LV function. 3. He will be discharged home on aspirin, metoprolol, Imdur, Plavix, and a statin. He will followup with Dr. Hamilton Capri in 2-4 weeks    Jan 2018 Nuclear stress  Blood pressure demonstrated a normal response to exercise. Nonlimiting exercise induced chest pain.  There was no ST segment deviation noted during stress. Duke treadmill score is 3, moderate risk  Findings consistent with prior apical and distal anterolateralmyocardial infarction with significant peri-infarct ischemia.  The left ventricular ejection fraction is mildly decreased (45-54%).  Overall moderate to high risk findings based on Duke treadmill score, mildly decreased LVEF, and ischemia by imaging.    Jan 2018 echo Study Conclusions  - Left ventricle: The cavity size was normal. Wall thickness was increased in a pattern of mild LVH. Systolic function was normal. The estimated ejection fraction was in the range of 55% to 60%. Wall motion was normal; there were no regional wall motion abnormalities. Left ventricular diastolic function parameters were normal. -  Aortic valve: Valve area (VTI): 3.41 cm^2. Valve area (Vmax): 2.78 cm^2. Valve area (Vmean): 2.87 cm^2. - Technically adequate study.     Assessment and Plan  1. CAD -no recurrent symptoms with alteration of antianginal therapy - we will conitnue current meds, if recurrent or refractory symptoms we will consider cath. Counseled extensively on symptoms to pay attention for and notify our office about including SOB/chest pain   F/u 4 months      Arnoldo Lenis, M.D.

## 2016-07-31 NOTE — Patient Instructions (Signed)
Your physician wants you to follow-up in: 4 MONTHS WITH DR BRANCH You will receive a reminder letter in the mail two months in advance. If you don't receive a letter, please call our office to schedule the follow-up appointment.  Your physician recommends that you continue on your current medications as directed. Please refer to the Current Medication list given to you today.  Thank you for choosing [facility]!!   

## 2016-08-19 ENCOUNTER — Other Ambulatory Visit: Payer: Self-pay | Admitting: *Deleted

## 2016-08-19 MED ORDER — METOPROLOL SUCCINATE ER 25 MG PO TB24: 25.0000 mg | ORAL_TABLET | Freq: Every day | ORAL | 1 refills | Status: DC

## 2016-10-17 ENCOUNTER — Other Ambulatory Visit: Payer: Self-pay | Admitting: Family Medicine

## 2016-10-17 DIAGNOSIS — I10 Essential (primary) hypertension: Secondary | ICD-10-CM

## 2016-10-17 DIAGNOSIS — R809 Proteinuria, unspecified: Secondary | ICD-10-CM

## 2016-10-17 DIAGNOSIS — E1165 Type 2 diabetes mellitus with hyperglycemia: Secondary | ICD-10-CM

## 2016-10-17 DIAGNOSIS — IMO0001 Reserved for inherently not codable concepts without codable children: Secondary | ICD-10-CM

## 2016-10-30 ENCOUNTER — Encounter: Payer: Self-pay | Admitting: Family Medicine

## 2016-10-30 ENCOUNTER — Ambulatory Visit (INDEPENDENT_AMBULATORY_CARE_PROVIDER_SITE_OTHER): Payer: BLUE CROSS/BLUE SHIELD | Admitting: Family Medicine

## 2016-10-30 VITALS — BP 108/60 | HR 85 | Temp 97.0°F | Ht 71.0 in | Wt 290.0 lb

## 2016-10-30 DIAGNOSIS — E1129 Type 2 diabetes mellitus with other diabetic kidney complication: Secondary | ICD-10-CM

## 2016-10-30 DIAGNOSIS — R809 Proteinuria, unspecified: Secondary | ICD-10-CM | POA: Diagnosis not present

## 2016-10-30 DIAGNOSIS — I10 Essential (primary) hypertension: Secondary | ICD-10-CM | POA: Diagnosis not present

## 2016-10-30 DIAGNOSIS — E785 Hyperlipidemia, unspecified: Secondary | ICD-10-CM | POA: Diagnosis not present

## 2016-10-30 LAB — BAYER DCA HB A1C WAIVED: HB A1C (BAYER DCA - WAIVED): 6.2 % (ref <7.0)

## 2016-10-30 MED ORDER — LISINOPRIL 10 MG PO TABS: 10.0000 mg | ORAL_TABLET | Freq: Every day | ORAL | 1 refills | Status: DC

## 2016-10-30 MED ORDER — LINAGLIPTIN-METFORMIN HCL ER 5-1000 MG PO TB24: 1.0000 | ORAL_TABLET | Freq: Every day | ORAL | 1 refills | Status: DC

## 2016-10-30 NOTE — Progress Notes (Signed)
BP 108/60   Pulse 85   Temp (!) 97 F (36.1 C) (Oral)   Ht _0  (1.803 m)   Wt 290 lb (131.5 kg)   BMI 40.45 kg/m    Subjective:    Patient ID: Carl [name], male    DOB: [birth year], 41 y.o.   MRN: [id number]  HPI: Carl [name] is a 41 y.o. male presenting on 10/30/2016 for Diabetes (3 mo); Hyperlipidemia; and Hypertension   HPI Type 2 diabetes mellitus Patient comes in today for recheck of his diabetes. Patient has been currently taking Linagliptin-metformin. Patient is currently on an ACE inhibitor/ARB. Patient has not seen an ophthalmologist this year. Patient denies any issues with their feet. Patient did have some microalbuminuria on last visit and we are keeping an eye on that, he is currently already on an ACE inhibitor  Hypertension Patient is currently on Imdur and metoprolol and lisinopril, and their blood pressure today is 108/60. Patient denies any lightheadedness or dizziness. Patient denies headaches, blurred vision, chest pains, shortness of breath, or weakness. Denies any side effects from medication and is content with current medication.   Hyperlipidemia Patient is coming in for recheck of his hyperlipidemia. The patient is currently taking atorvastatin. They deny any issues with myalgias or history of liver damage from it. They deny any focal numbness or weakness or chest pain.   Relevant past medical, surgical, family and social history reviewed and updated as indicated. Interim medical history since our last visit reviewed. Allergies and medications reviewed and updated.  Review of Systems  Constitutional: Negative for chills and fever.  HENT: Negative for tinnitus.   Eyes: Negative for visual disturbance.  Respiratory: Negative for cough, shortness of breath and wheezing.   Cardiovascular: Negative for chest pain, palpitations and leg swelling.  Genitourinary: Negative for dysuria and hematuria.  Musculoskeletal: Negative for back pain, gait problem and  myalgias.  Skin: Negative for rash.  Neurological: Negative for dizziness, weakness and headaches.  Psychiatric/Behavioral: Negative for suicidal ideas.  All other systems reviewed and are negative.   Per HPI unless specifically indicated above      Objective:    BP 108/60   Pulse 85   Temp (!) 97 F (36.1 C) (Oral)   Ht _1  (1.803 m)   Wt 290 lb (131.5 kg)   BMI 40.45 kg/m   Wt Readings from Last 3 Encounters:  10/30/16 290 lb (131.5 kg)  07/31/16 295 lb (133.8 kg)  07/14/16 293 lb (132.9 kg)    Physical Exam  Constitutional: He is oriented to person, place, and time. He appears well-developed and well-nourished. No distress.  Eyes: Conjunctivae are normal. No scleral icterus.  Neck: Neck supple. No thyromegaly present.  Cardiovascular: Normal rate, regular rhythm, normal heart sounds and intact distal pulses.   No murmur heard. Pulmonary/Chest: Effort normal and breath sounds normal. No respiratory distress. He has no wheezes. He has no rales.  Musculoskeletal: Normal range of motion. He exhibits no edema.  Lymphadenopathy:    He has no cervical adenopathy.  Neurological: He is alert and oriented to person, place, and time. Coordination normal.  Skin: Skin is warm and dry. No rash noted. He is not diaphoretic.  Psychiatric: He has a normal mood and affect. His behavior is normal.  Nursing note and vitals reviewed.  Diabetic Foot Exam - Simple   Simple Foot Form Diabetic Foot exam was performed with the following findings:  Yes 10/30/2016 10:08 AM  Visual Inspection No  deformities, no ulcerations, no other skin breakdown bilaterally:  Yes Sensation Testing Intact to touch and monofilament testing bilaterally:  Yes Pulse Check Posterior Tibialis and Dorsalis pulse intact bilaterally:  Yes Comments Patient does have some tinea pedis between his toes, recommended over-the-counter athlete foot medicine         Assessment & Plan:   Problem List Items Addressed  This Visit      Cardiovascular and Mediastinum   Essential hypertension, benign   Relevant Medications   lisinopril (PRINIVIL,ZESTRIL) 10 MG tablet   Other Relevant Orders   CMP14+EGFR     Endocrine   Controlled diabetes mellitus type 2 with complications ([place]) - Primary   Relevant Medications   lisinopril (PRINIVIL,ZESTRIL) 10 MG tablet   Linagliptin-Metformin HCl ER 06-998 MG TB24     Other   Hyperlipidemia LDL goal <130   Relevant Medications   lisinopril (PRINIVIL,ZESTRIL) 10 MG tablet    Other Visit Diagnoses    Microalbuminuria       Relevant Medications   lisinopril (PRINIVIL,ZESTRIL) 10 MG tablet   Other Relevant Orders   CMP14+EGFR       Follow up plan: Return in about 3 months (around 01/29/2017), or if symptoms worsen or fail to improve, for type 2 dm.  Counseling provided for all of the vaccine components Orders Placed This Encounter  Procedures  . Bayer DCA Hb A1c Waived  . [facility] Dettinger, MD [facility] 10/30/2016, 9:55 AM

## 2016-10-31 LAB — CMP14+EGFR
A/G RATIO: 1.5 (ref 1.2–2.2)
ALT: 18 IU/L (ref 0–44)
AST: 15 IU/L (ref 0–40)
Albumin: 4.2 g/dL (ref 3.5–5.5)
Alkaline Phosphatase: 70 IU/L (ref 39–117)
BILIRUBIN TOTAL: 0.4 mg/dL (ref 0.0–1.2)
BUN / CREAT RATIO: 15 (ref 9–20)
BUN: 16 mg/dL (ref 6–24)
CO2: 21 mmol/L (ref 20–29)
Calcium: 9.1 mg/dL (ref 8.7–10.2)
Chloride: 105 mmol/L (ref 96–106)
Creatinine, Ser: 1.1 mg/dL (ref 0.76–1.27)
GFR calc Af Amer: 95 mL/min/{1.73_m2} (ref >59)
GFR, EST NON AFRICAN AMERICAN: 82 mL/min/{1.73_m2} (ref >59)
GLUCOSE: 102 mg/dL — AB (ref 65–99)
Globulin, Total: 2.8 g/dL (ref 1.5–4.5)
POTASSIUM: 4.9 mmol/L (ref 3.5–5.2)
SODIUM: 142 mmol/L (ref 134–144)
TOTAL PROTEIN: 7 g/dL (ref 6.0–8.5)

## 2016-12-23 ENCOUNTER — Telehealth: Payer: Self-pay | Admitting: Family Medicine

## 2016-12-25 NOTE — Telephone Encounter (Signed)
Faxed a1c

## 2017-01-04 ENCOUNTER — Encounter: Payer: Self-pay | Admitting: *Deleted

## 2017-01-25 NOTE — Progress Notes (Deleted)
Subjective: CC: "3 month check up" PCP: Dettinger, Fransisca Kaufmann, MD HFW:YOVZC R Tarbet is a 41 y.o. male presenting to clinic today for:  1. Type 2 Diabetes:  Patient reports: Glucometer:***, High at home: ***; Low at home: ***, Taking medication(s): Linagliptin/Metformin, Lipitor 80mg , ASA 81mg , Lisinopril 10mg .  Side effects: ***  Last eye exam: *** Last foot exam: 3 months ago Last A1c: 6.2, in September Nephropathy screen indicated?: on ACE-I Last flu, zoster and/or pneumovax: ***  ROS: denies fever, chills, dizziness, LOC, polyuria, polydipsia, unintended weight loss/gain, foot ulcerations, numbness or tingling in extremities or chest pain.   No Known Allergies Past Medical History:  Diagnosis Date  . Diabetes mellitus without complication ([place])   . Hyperlipidemia   . Hypertension    Family History  Problem Relation Age of Onset  . Diabetes Mother   . Diabetes Father     Current Outpatient Medications:  .  aspirin EC 81 MG tablet, Take 81 mg by mouth., Disp: , Rfl:  .  atorvastatin (LIPITOR) 80 MG tablet, Take 80 mg by mouth daily., Disp: , Rfl:  .  isosorbide mononitrate (IMDUR) 60 MG 24 hr tablet, Take 1 tablet (60 mg total) by mouth daily. Dose increased 04/24/2016, Disp: 90 tablet, Rfl: 3 .  Linagliptin-Metformin HCl ER 06-998 MG TB24, Take 1 tablet by mouth daily., Disp: 90 tablet, Rfl: 1 .  lisinopril (PRINIVIL,ZESTRIL) 10 MG tablet, Take 1 tablet (10 mg total) by mouth daily., Disp: 90 tablet, Rfl: 1 .  metoprolol succinate (TOPROL XL) 25 MG 24 hr tablet, Take 1 tablet (25 mg total) by mouth daily., Disp: 90 tablet, Rfl: 1 .  UNABLE TO FIND, Vit C 2000 daily, Disp: , Rfl:   Social Hx: *** smoker.  Health Maintenance: *** Flu Vaccine needed: {YES/NO/WILD CARDS:18581}  Tdap Vaccine needed: {YES/NO/WILD CARDS:18581}  - every 20yrs - (<3 lifetime doses or unknown): all wounds -- look up need for Tetanus IG - (>=3 lifetime doses): clean/minor wound if >61yrs from  previous; all other wounds if >46yrs from previous Zoster Vaccine needed: {YES/NO/WILD CARDS:18581} (those >50yo, once) Pneumonia Vaccine needed: {YES/NO/WILD CARDS:18581} (those w/ risk factors) - (<57yr) Both: Immunocompromised, cochlear implant, CSF leak, asplenic, sickle cell, Chronic Renal Failure - (<3yr) PPSV-23 only: Heart dz, lung disease, DM, tobacco abuse, alcoholism, cirrhosis/liver disease. - (>62yr): PPSV13 then PPSV23 in 6-12mths;  - (>54yr): repeat PPSV23 once if pt received prior to 43yo and 86yrs have passed   ROS: Per HPI  Objective: Office vital signs reviewed. There were no vitals taken for this visit.  Physical Examination:  General: Awake, alert, *** nourished, No acute distress HEENT: Normal    Neck: No masses palpated. No lymphadenopathy    Ears: Tympanic membranes intact, normal light reflex, no erythema, no bulging    Eyes: PERRLA, extraocular membranes intact, sclera ***    Nose: nasal turbinates moist, *** nasal discharge    Throat: moist mucus membranes, no erythema, *** tonsillar exudate.  Airway is patent Cardio: regular rate and rhythm, S1S2 heard, no murmurs appreciated Pulm: clear to auscultation bilaterally, no wheezes, rhonchi or rales; normal work of breathing on room air GI: soft, non-tender, non-distended, bowel sounds present x4, no hepatomegaly, no splenomegaly, no masses GU: external vaginal tissue ***, cervix ***, *** punctate lesions on cervix appreciated, *** discharge from cervical os, *** bleeding, *** cervical motion tenderness, *** abdominal/ adnexal masses Extremities: warm, well perfused, No edema, cyanosis or clubbing; +*** pulses bilaterally MSK: *** gait and *** station  Skin: dry; intact; no rashes or lesions Neuro: *** Strength and light touch sensation grossly intact, *** DTRs ***/4  Assessment/ Plan: 41 y.o. male   ***  No orders of the defined types were placed in this encounter.  No orders of the defined types were  placed in this encounter.    Janora Norlander, DO [place] [phone]

## 2017-01-26 ENCOUNTER — Ambulatory Visit: Payer: BLUE CROSS/BLUE SHIELD | Admitting: Family Medicine

## 2017-01-29 ENCOUNTER — Ambulatory Visit: Payer: BLUE CROSS/BLUE SHIELD | Admitting: Family Medicine

## 2017-02-06 ENCOUNTER — Encounter: Payer: Self-pay | Admitting: Family Medicine

## 2017-02-07 ENCOUNTER — Other Ambulatory Visit: Payer: Self-pay | Admitting: Cardiology

## 2017-02-08 ENCOUNTER — Ambulatory Visit: Payer: BLUE CROSS/BLUE SHIELD | Admitting: Family Medicine

## 2017-02-12 ENCOUNTER — Other Ambulatory Visit: Payer: Self-pay | Admitting: *Deleted

## 2017-02-12 MED ORDER — METOPROLOL SUCCINATE ER 25 MG PO TB24: 25.0000 mg | ORAL_TABLET | Freq: Every day | ORAL | 1 refills | Status: DC

## 2017-02-12 NOTE — Addendum Note (Signed)
Addended by: Merlene Laughter on: 02/12/2017 12:21 PM   Modules accepted: Orders

## 2017-02-27 ENCOUNTER — Other Ambulatory Visit: Payer: Self-pay | Admitting: Cardiology

## 2017-03-11 ENCOUNTER — Ambulatory Visit: Payer: BLUE CROSS/BLUE SHIELD | Admitting: Family Medicine

## 2017-03-11 ENCOUNTER — Ambulatory Visit: Payer: BLUE CROSS/BLUE SHIELD | Admitting: Cardiology

## 2017-03-15 ENCOUNTER — Ambulatory Visit (INDEPENDENT_AMBULATORY_CARE_PROVIDER_SITE_OTHER): Payer: BLUE CROSS/BLUE SHIELD | Admitting: Family Medicine

## 2017-03-15 ENCOUNTER — Encounter: Payer: Self-pay | Admitting: Family Medicine

## 2017-03-15 VITALS — BP 117/71 | HR 77 | Temp 97.0°F | Ht 71.0 in | Wt 289.0 lb

## 2017-03-15 DIAGNOSIS — E1159 Type 2 diabetes mellitus with other circulatory complications: Secondary | ICD-10-CM | POA: Diagnosis not present

## 2017-03-15 DIAGNOSIS — J439 Emphysema, unspecified: Secondary | ICD-10-CM | POA: Diagnosis not present

## 2017-03-15 DIAGNOSIS — E1169 Type 2 diabetes mellitus with other specified complication: Secondary | ICD-10-CM

## 2017-03-15 DIAGNOSIS — I10 Essential (primary) hypertension: Secondary | ICD-10-CM | POA: Diagnosis not present

## 2017-03-15 DIAGNOSIS — E785 Hyperlipidemia, unspecified: Secondary | ICD-10-CM

## 2017-03-15 DIAGNOSIS — E118 Type 2 diabetes mellitus with unspecified complications: Secondary | ICD-10-CM

## 2017-03-15 LAB — BAYER DCA HB A1C WAIVED: HB A1C: 6.1 % (ref <7.0)

## 2017-03-15 MED ORDER — ALBUTEROL SULFATE HFA 108 (90 BASE) MCG/ACT IN AERS: 2.0000 | INHALATION_SPRAY | Freq: Four times a day (QID) | RESPIRATORY_TRACT | 0 refills | Status: DC | PRN

## 2017-03-15 NOTE — Progress Notes (Signed)
BP 117/71   Pulse 77   Temp (!) 97 F (36.1 C) (Oral)   Ht '5\' 11"'$  (1.803 m)   Wt 289 lb (131.1 kg)   BMI 40.31 kg/m    Subjective:    Patient ID: Carl [name], male    DOB: 1975-05-21, 42 y.o.   MRN: [id number]  HPI: Carl [name] is a 42 y.o. male presenting on 03/15/2017 for Diabetes (follow up); Hyperlipidemia; and Hypertension   HPI Type 2 diabetes mellitus Patient comes in today for recheck of his diabetes. Patient has been currently taking Jentadueto. Patient is currently on an ACE inhibitor/ARB. Patient has not seen an ophthalmologist this year. Patient denies any issues with their feet.   Hyperlipidemia Patient is coming in for recheck of his hyperlipidemia. The patient is currently taking Lipitor. They deny any issues with myalgias or history of liver damage from it. They deny any focal numbness or weakness or chest pain.   Hypertension Patient is currently on Imdur and lisinopril and metoprolol, and their blood pressure today is 117/71. Patient denies any lightheadedness or dizziness. Patient denies headaches, blurred vision, chest pains, shortness of breath, or weakness. Denies any side effects from medication and is content with current medication.   COPD Patient gets some chest tightness and breathing difficulties and he gets some chest tightness along with it.  Patient had cardiac stress testing last year and is seen a cardiologist.  Patient says he will especially have more issues in the summertime with the heat and humidity.  He says he also gets some breathing issues and chest tightness on exertion.  He does admit that he is a 2-1/2 pack/day smoker still and has smoked  Relevant past medical, surgical, family and social history reviewed and updated as indicated. Interim medical history since our last visit reviewed. Allergies and medications reviewed and updated.  Review of Systems  Constitutional: Negative for chills and fever.  HENT: Negative for congestion.     Respiratory: Negative for cough, shortness of breath and wheezing.   Cardiovascular: Negative for chest pain and leg swelling.  Musculoskeletal: Negative for back pain and gait problem.  Skin: Negative for rash.  Neurological: Negative for dizziness, weakness, light-headedness and numbness.  All other systems reviewed and are negative.  Per HPI unless specifically indicated above   Allergies as of 03/15/2017   No Known Allergies     Medication List        Accurate as of 03/15/17  9:05 AM. Always use your most recent med list.          aspirin EC 81 MG tablet Take 81 mg by mouth.   atorvastatin 80 MG tablet Commonly known as:  LIPITOR TAKE ONE TABLET BY MOUTH ONCE DAILY   isosorbide mononitrate 60 MG 24 hr tablet Commonly known as:  IMDUR Take 1 tablet (60 mg total) by mouth daily. Dose increased 04/24/2016   Linagliptin-Metformin HCl ER 06-998 MG Tb24 Take 1 tablet by mouth daily.   lisinopril 10 MG tablet Commonly known as:  PRINIVIL,ZESTRIL Take 1 tablet (10 mg total) by mouth daily.   metoprolol succinate 25 MG 24 hr tablet Commonly known as:  TOPROL-XL Take 1 tablet (25 mg total) by mouth daily.   UNABLE TO FIND Vit C 2000 daily          Objective:    BP 117/71   Pulse 77   Temp (!) 97 F (36.1 C) (Oral)   Ht '5\' 11"'$  (1.803 m)  Wt 289 lb (131.1 kg)   BMI 40.31 kg/m   Wt Readings from Last 3 Encounters:  03/15/17 289 lb (131.1 kg)  10/30/16 290 lb (131.5 kg)  07/31/16 295 lb (133.8 kg)    Physical Exam  Constitutional: He is oriented to person, place, and time. He appears well-developed and well-nourished. No distress.  Eyes: Conjunctivae are normal. No scleral icterus.  Neck: Neck supple. No thyromegaly present.  Cardiovascular: Normal rate, regular rhythm, normal heart sounds and intact distal pulses.  No murmur heard. Pulmonary/Chest: Effort normal and breath sounds normal. No respiratory distress. He has no wheezes. He has no rales.   Musculoskeletal: Normal range of motion. He exhibits no edema.  Lymphadenopathy:    He has no cervical adenopathy.  Neurological: He is alert and oriented to person, place, and time. Coordination normal.  Skin: Skin is warm and dry. No rash noted. He is not diaphoretic.  Psychiatric: He has a normal mood and affect. His behavior is normal.  Nursing note and vitals reviewed.   Results for orders placed or performed in visit on 10/30/16  Bayer DCA Hb A1c Waived  Result Value Ref Range   Bayer DCA Hb A1c Waived 6.2 <7.0 %  CMP14+EGFR  Result Value Ref Range   Glucose 102 (H) 65 - 99 mg/dL   BUN 16 6 - 24 mg/dL   Creatinine, Ser 1.10 0.76 - 1.27 mg/dL   GFR calc non Af Amer 82 >59 mL/min/1.73   GFR calc Af Amer 95 >59 mL/min/1.73   BUN/Creatinine Ratio 15 9 - 20   Sodium 142 134 - 144 mmol/L   Potassium 4.9 3.5 - 5.2 mmol/L   Chloride 105 96 - 106 mmol/L   CO2 21 20 - 29 mmol/L   Calcium 9.1 8.7 - 10.2 mg/dL   Total Protein 7.0 6.0 - 8.5 g/dL   Albumin 4.2 3.5 - 5.5 g/dL   Globulin, Total 2.8 1.5 - 4.5 g/dL   Albumin/Globulin Ratio 1.5 1.2 - 2.2   Bilirubin Total 0.4 0.0 - 1.2 mg/dL   Alkaline Phosphatase 70 39 - 117 IU/L   AST 15 0 - 40 IU/L   ALT 18 0 - 44 IU/L      Assessment & Plan:   Problem List Items Addressed This Visit      Cardiovascular and Mediastinum   Hypertension associated with diabetes ([place])   Relevant Orders   CMP14+EGFR     Endocrine   Hyperlipidemia associated with type 2 diabetes mellitus ([place])   Relevant Orders   Lipid panel   Controlled diabetes mellitus type 2 with complications ([place]) - Primary   Relevant Orders   Bayer DCA Hb A1c Waived   CMP14+EGFR     Other   Morbid obesity ([place])    Other Visit Diagnoses    Pulmonary emphysema, unspecified emphysema type ([place])       Relevant Orders   DG Chest 2 View      Will give patient a rescue inhaler and during the summertime may consider Spiriva.  He says he is going to change pharmacies  and will call back with whatever pharmacy he wants to go to and we will send refills. Follow up plan: Return in about 3 months (around 06/13/2017), or if symptoms worsen or fail to improve, for Diabetes and hyperlipidemia.  Counseling provided for all of the vaccine components No orders of the defined types were placed in this encounter.   Caryl Pina, MD [facility] 03/15/2017,  9:05 AM

## 2017-03-16 LAB — CMP14+EGFR
A/G RATIO: 1.6 (ref 1.2–2.2)
ALT: 18 IU/L (ref 0–44)
AST: 14 IU/L (ref 0–40)
Albumin: 4.3 g/dL (ref 3.5–5.5)
Alkaline Phosphatase: 75 IU/L (ref 39–117)
BUN/Creatinine Ratio: 10 (ref 9–20)
BUN: 10 mg/dL (ref 6–24)
Bilirubin Total: 0.5 mg/dL (ref 0.0–1.2)
CALCIUM: 9.2 mg/dL (ref 8.7–10.2)
CO2: 24 mmol/L (ref 20–29)
Chloride: 102 mmol/L (ref 96–106)
Creatinine, Ser: 1.03 mg/dL (ref 0.76–1.27)
GFR, EST AFRICAN AMERICAN: 102 mL/min/{1.73_m2} (ref >59)
GFR, EST NON AFRICAN AMERICAN: 89 mL/min/{1.73_m2} (ref >59)
GLUCOSE: 119 mg/dL — AB (ref 65–99)
Globulin, Total: 2.7 g/dL (ref 1.5–4.5)
Potassium: 4.2 mmol/L (ref 3.5–5.2)
Sodium: 140 mmol/L (ref 134–144)
TOTAL PROTEIN: 7 g/dL (ref 6.0–8.5)

## 2017-03-16 LAB — LIPID PANEL
CHOL/HDL RATIO: 4.2 ratio (ref 0.0–5.0)
Cholesterol, Total: 108 mg/dL (ref 100–199)
HDL: 26 mg/dL — AB (ref >39)
LDL CALC: 55 mg/dL (ref 0–99)
TRIGLYCERIDES: 136 mg/dL (ref 0–149)
VLDL Cholesterol Cal: 27 mg/dL (ref 5–40)

## 2017-03-22 ENCOUNTER — Encounter: Payer: Self-pay | Admitting: Family

## 2017-03-22 ENCOUNTER — Ambulatory Visit (INDEPENDENT_AMBULATORY_CARE_PROVIDER_SITE_OTHER): Payer: BLUE CROSS/BLUE SHIELD | Admitting: Family

## 2017-03-22 VITALS — BP 107/62 | HR 91 | Temp 97.2°F | Ht 71.0 in | Wt 287.0 lb

## 2017-03-22 DIAGNOSIS — Z720 Tobacco use: Secondary | ICD-10-CM | POA: Diagnosis not present

## 2017-03-22 DIAGNOSIS — J019 Acute sinusitis, unspecified: Secondary | ICD-10-CM | POA: Diagnosis not present

## 2017-03-22 MED ORDER — AMOXICILLIN-POT CLAVULANATE 875-125 MG PO TABS
1.0000 | ORAL_TABLET | Freq: Two times a day (BID) | ORAL | 0 refills | Status: DC
Start: 2017-03-22 — End: 2017-04-02

## 2017-03-22 NOTE — Patient Instructions (Signed)

## 2017-03-22 NOTE — Progress Notes (Signed)
   Subjective:    Patient ID: Carl [name], male    DOB: 12-09-1975, 42 y.o.   MRN: [id number]  Sinusitis  This is a new problem. The current episode started in the past 7 days. The problem has been waxing and waning since onset. There has been no fever. His pain is at a severity of 6/10. The pain is moderate. Associated symptoms include chills, congestion, coughing, headaches, a hoarse voice, sinus pressure, sneezing and a sore throat. Pertinent negatives include no ear pain. Past treatments include oral decongestants. The treatment provided mild relief.      Review of Systems  Constitutional: Positive for chills.  HENT: Positive for congestion, hoarse voice, sinus pressure, sneezing and sore throat. Negative for ear pain.   Respiratory: Positive for cough.   Neurological: Positive for headaches.  All other systems reviewed and are negative.      Objective:   Physical Exam  Constitutional: He is oriented to person, place, and time. He appears well-developed and well-nourished. No distress.  HENT:  Head: Normocephalic.  Right Ear: External ear normal.  Left Ear: External ear normal.  Nose: Mucosal edema and rhinorrhea present. Right sinus exhibits maxillary sinus tenderness. Left sinus exhibits maxillary sinus tenderness.  Mouth/Throat: Posterior oropharyngeal erythema present.  Eyes: Pupils are equal, round, and reactive to light. Right eye exhibits no discharge. Left eye exhibits no discharge.  Neck: Normal range of motion. Neck supple. No thyromegaly present.  Cardiovascular: Normal rate, regular rhythm, normal heart sounds and intact distal pulses.  No murmur heard. Pulmonary/Chest: Effort normal. No respiratory distress. He has decreased breath sounds. He has no wheezes.  Abdominal: Soft. Bowel sounds are normal. He exhibits no distension. There is no tenderness.  Musculoskeletal: Normal range of motion. He exhibits no edema or tenderness.  Neurological: He is alert and  oriented to person, place, and time. He has normal reflexes. No cranial nerve deficit.  Skin: Skin is warm and dry. No rash noted. No erythema.  Psychiatric: He has a normal mood and affect. His behavior is normal. Judgment and thought content normal.  Vitals reviewed.     BP 107/62   Pulse 91   Temp (!) 97.2 F (36.2 C) (Oral)   Ht 5\' 11"  (1.803 m)   Wt 287 lb (130.2 kg)   BMI 40.03 kg/m      Assessment & Plan:  1. Acute non-recurrent sinusitis, unspecified location - Take meds as prescribed - Use a cool mist humidifier  -Use saline nose sprays frequently -Force fluids -For any cough or congestion  Use plain Mucinex- regular strength or max strength is fine -For fever or aces or pains- take tylenol or ibuprofen appropriate for age and weight. -Throat lozenges if help - amoxicillin-clavulanate (AUGMENTIN) 875-125 MG tablet; Take 1 tablet by mouth 2 (two) times daily.  Dispense: 14 tablet; Refill: 0  2. Tobacco user Smoking cessation  - amoxicillin-clavulanate (AUGMENTIN) 875-125 MG tablet; Take 1 tablet by mouth 2 (two) times daily.  Dispense: 14 tablet; Refill: 0   Evelina Dun, FNP

## 2017-04-02 ENCOUNTER — Encounter: Payer: Self-pay | Admitting: Cardiology

## 2017-04-02 ENCOUNTER — Other Ambulatory Visit: Payer: Self-pay

## 2017-04-02 ENCOUNTER — Ambulatory Visit (INDEPENDENT_AMBULATORY_CARE_PROVIDER_SITE_OTHER): Payer: BLUE CROSS/BLUE SHIELD | Admitting: Cardiology

## 2017-04-02 VITALS — BP 112/72 | HR 76 | Ht 71.0 in | Wt 285.6 lb

## 2017-04-02 DIAGNOSIS — I25118 Atherosclerotic heart disease of native coronary artery with other forms of angina pectoris: Secondary | ICD-10-CM

## 2017-04-02 MED ORDER — METOPROLOL SUCCINATE ER 25 MG PO TB24: 37.5000 mg | ORAL_TABLET | Freq: Every day | ORAL | 3 refills | Status: DC

## 2017-04-02 NOTE — Patient Instructions (Signed)
Medication Instructions:  Your physician has recommended you make the following change in your medication:   INCREASE Toprol XL to 37.5 mg daily  Please continue all other medications as prescribed  Labwork: NONE  Testing/Procedures: NONE  Follow-Up: Your physician recommends that you schedule a follow-up appointment in: [place] DR. BRANCH   Any Other Special Instructions Will Be Listed Below (If Applicable).  If you need a refill on your cardiac medications before your next appointment, please call your pharmacy.

## 2017-04-02 NOTE — Progress Notes (Signed)
Clinical Summary Carl [name] is a 42 y.o.male seen today as a focused visit on his history of CAD and chest pain  1. CAD - abnormal exericse stress in 2015. - cath 2015 at [facility] with occluded proximal LAD, LAD filled with left-left and right-left collaterals. Medically managed - reports recent chest pain on Thanksgiving night. Pressure while walkg, midchest. 3/10 in severity. No other associated symptoms. Not positional. Pain lasted for 10 minutes. Multiple episodes prior to that. Pain is less severe from 2015. Overall stable - echo Jan 2018 LVEF 55-60%   - nuclear stress Jan 2018 showed apical and distal anterolateral infarct with ischemia. Overall intermediate to high risk.  -fairly stable chest pain symptoms that come on only with high levels of work. 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    2. Hyperlipidemia - he remains compliant with statin.   3. HTN - compliant with meds    Past Medical History:  Diagnosis Date  . Diabetes mellitus without complication ([place])   . Hyperlipidemia   . Hypertension      No Known Allergies   Current Outpatient Medications  Medication Sig Dispense Refill  . albuterol (PROVENTIL HFA;VENTOLIN HFA) 108 (90 Base) MCG/ACT inhaler Inhale 2 puffs into the lungs every 6 (six) hours as needed for wheezing or shortness of breath. 1 Inhaler 0  . amoxicillin-clavulanate (AUGMENTIN) 875-125 MG tablet Take 1 tablet by mouth 2 (two) times daily. 14 tablet 0  . aspirin EC 81 MG tablet Take 81 mg by mouth.    Marland Kitchen atorvastatin (LIPITOR) 80 MG tablet TAKE ONE TABLET BY MOUTH ONCE DAILY 90 tablet 3  . isosorbide mononitrate (IMDUR) 60 MG 24 hr tablet Take 1 tablet (60 mg total) by mouth daily. Dose increased 04/24/2016 90 tablet 3  . Linagliptin-Metformin HCl ER 06-998 MG TB24 Take 1 tablet by mouth daily. 90 tablet 1  . lisinopril (PRINIVIL,ZESTRIL) 10 MG tablet Take 1 tablet (10 mg total) by mouth daily.  90 tablet 1  . metoprolol succinate (TOPROL-XL) 25 MG 24 hr tablet Take 1 tablet (25 mg total) by mouth daily. 90 tablet 1  . UNABLE TO FIND Vit C 2000 daily     No current facility-administered medications for this visit.      Past Surgical History:  Procedure Laterality Date  . SKIN BIOPSY    . TUMOR EXCISION     left knee area     No Known Allergies    Family History  Problem Relation Age of Onset  . Diabetes Mother   . Diabetes Father      Social History Mr. Dangerfield reports that he has been smoking cigarettes.  He started smoking about 24 years ago. He has been smoking about 2.50 packs per day. he has never used smokeless tobacco. Mr. Matlock reports that he drinks alcohol.   Review of Systems CONSTITUTIONAL: No weight loss, fever, chills, weakness or fatigue.  HEENT: Eyes: No visual loss, blurred vision, double vision or yellow sclerae.No hearing loss, sneezing, congestion, runny nose or sore throat.  SKIN: No rash or itching.  CARDIOVASCULAR: per hpi RESPIRATORY: per hpi GASTROINTESTINAL: No anorexia, nausea, vomiting or diarrhea. No abdominal pain or blood.  GENITOURINARY: No burning on urination, no polyuria NEUROLOGICAL: No headache, dizziness, syncope, paralysis, ataxia, numbness or tingling in the extremities. No change in bowel or bladder control.  MUSCULOSKELETAL: No muscle, back pain, joint pain or stiffness.  LYMPHATICS: No enlarged nodes. No  history of splenectomy.  PSYCHIATRIC: No history of depression or anxiety.  ENDOCRINOLOGIC: No reports of sweating, cold or heat intolerance. No polyuria or polydipsia.  Marland Kitchen   Physical Examination Vitals:   04/02/17 0918  BP: 112/72  Pulse: 76   Vitals:   04/02/17 0918  Weight: 285 lb 9.6 oz (129.5 kg)  Height: 5\' 11"  (1.803 m)    Gen: resting comfortably, no acute distress HEENT: no scleral icterus, pupils equal round and reactive, no palptable cervical adenopathy,  CV: RRR, no mrg, no jvd Resp: Clear to  auscultation bilaterally GI: abdomen is soft, non-tender, non-distended, normal bowel sounds, no hepatosplenomegaly MSK: extremities are warm, no edema.  Skin: warm, no rash Neuro:  no focal deficits Psych: appropriate affect   Diagnostic Studies 01/2014 cath [facility] FINDINGS: 1. Left Main: Widely patent. 2. LAD: The LAD is occluded in the proximal region.The mid and distal LAD is supplied by collateral flow from the circumflex and from the right coronary artery 3. Circumflex: Widely patent. There is a small first obtuse marginal artery and a large second obtuse marginal artery. The distal circumflex trifurcates. 4. Right Coronary Artery: There is 20-30% stenosis in the proximal and mid RCA. The RCA is dominant. The PDA and PLV branches are widely patent. 5. Hemodynamics: Aortic pressure is 141/70 mmHg LV pressure is 135/10 mmHg.   LEFT VENTRICULOGRAPHY: The ejection fraction is about 55%. Wall motion is normal.  CONCLUSIONS: 1. Occluded LAD with left to left and right to left collaterals flow. 2. Normal LV function. 3. He will be discharged home on aspirin, metoprolol, Imdur, Plavix, and a statin. He will followup with Dr. Hamilton Capri in 2-4 weeks    Jan 2018 Nuclear stress  Blood pressure demonstrated a normal response to exercise. Nonlimiting exercise induced chest pain.  There was no ST segment deviation noted during stress. Duke treadmill score is 3, moderate risk  Findings consistent with prior apical and distal anterolateralmyocardial infarction with significant peri-infarct ischemia.  The left ventricular ejection fraction is mildly decreased (45-54%).  Overall moderate to high risk findings based on Duke treadmill score, mildly decreased LVEF, and ischemia by imaging.    Jan 2018 echo Study Conclusions  - Left ventricle: The cavity size was normal. Wall thickness was increased in a pattern of mild LVH. Systolic function was normal. The estimated  ejection fraction was in the range of 55% to 60%. Wall motion was normal; there were no regional wall motion abnormalities. Left ventricular diastolic function parameters were normal. - Aortic valve: Valve area (VTI): 3.41 cm^2. Valve area (Vmax): 2.78 cm^2. Valve area (Vmean): 2.87 cm^2. - Technically adequate study.       Assessment and Plan   1. CAD with chronic stable angina.  -anginal symptoms remain stable, we will continue optimizing antianginal therapy. If progression of symptoms would plan for cath at that time. Asked specifically to contact us if change in symptoms. They have essentially been stable over a year.  - increase Toprol XL to 37.5mg  daily.   F/u 4 months       Arnoldo Lenis, M.D.

## 2017-04-05 ENCOUNTER — Encounter: Payer: Self-pay | Admitting: Cardiology

## 2017-04-16 ENCOUNTER — Encounter: Payer: Self-pay | Admitting: Family Medicine

## 2017-04-16 ENCOUNTER — Ambulatory Visit (INDEPENDENT_AMBULATORY_CARE_PROVIDER_SITE_OTHER): Payer: BLUE CROSS/BLUE SHIELD | Admitting: Family Medicine

## 2017-04-16 VITALS — BP 118/83 | HR 81 | Temp 97.0°F | Ht 71.0 in | Wt 285.0 lb

## 2017-04-16 DIAGNOSIS — L600 Ingrowing nail: Secondary | ICD-10-CM

## 2017-04-16 NOTE — Progress Notes (Signed)
Nail removal of left and right great toes, both the medial partial plate: Betadine used for prep. 2% lidocaine with epinephrine was used for digital block, 5 milliliters left and 10 mL right. Rubber band used for tourniquet to remove blood flow and decreased bleeding. Elevator was used to lift the nail bed and then forceps used to remove it from the base. Nail removed easily and probe was used to remove any leftover debris from nail bed.  Silver nitrate was used to achieve hemostasis.  Topical antibiotic was used and then it was covered by 4 x 4 and tape told in place. Procedure was tolerated well with minimal bleeding.  Problem List Items Addressed This Visit    None    Visit Diagnoses    Ingrown nail of great toe of left foot    -  Primary   Ingrown nail of great toe of right foot         Caryl Pina, MD [facility] 04/16/2017, 4:36 PM

## 2017-04-22 ENCOUNTER — Encounter: Payer: Self-pay | Admitting: Family Medicine

## 2017-04-22 ENCOUNTER — Ambulatory Visit: Payer: BLUE CROSS/BLUE SHIELD | Admitting: Family Medicine

## 2017-04-22 VITALS — BP 106/66 | HR 86 | Temp 96.8°F | Ht 71.0 in | Wt 283.0 lb

## 2017-04-22 DIAGNOSIS — L03039 Cellulitis of unspecified toe: Secondary | ICD-10-CM

## 2017-04-22 MED ORDER — SULFAMETHOXAZOLE-TRIMETHOPRIM 800-160 MG PO TABS: 1.0000 | ORAL_TABLET | Freq: Two times a day (BID) | ORAL | 0 refills | Status: DC

## 2017-04-22 NOTE — Progress Notes (Signed)
BP 106/66   Pulse 86   Temp (!) 96.8 F (36 C) (Oral)   Ht 5\' 11"  (1.803 m)   Wt 283 lb (128.4 kg)   BMI 39.47 kg/m    Subjective:    Patient ID: Carl [name], male    DOB: 08/11/1975, 42 y.o.   MRN: [id number]  HPI: Carl [name] is a 42 y.o. male presenting on 04/22/2017 for Follow up toenail removal (seen on 04/17/17 at Urgent Care prescribed Keflex, patient is on 5th day; do not look right to him and wants to make sure everything is okay)   HPI Redness and drainage from the toes after toenail removal Patient has been having redness and drainage from both toes bilaterally after toenail removal bilaterally on the medial plate of both toes.  He was seen in the emergency department given Keflex.  He has continued to have small amount of redness but that has improved and that is continued to have drainage that is been yellowish in nature from both sides.  He denies any fevers or chills or redness or warmth anywhere else  Relevant past medical, surgical, family and social history reviewed and updated as indicated. Interim medical history since our last visit reviewed. Allergies and medications reviewed and updated.  Review of Systems  Constitutional: Negative for chills and fever.  Respiratory: Negative for shortness of breath and wheezing.   Cardiovascular: Negative for chest pain and leg swelling.  Musculoskeletal: Negative for back pain and gait problem.  Skin: Positive for color change and wound. Negative for rash.  All other systems reviewed and are negative.   Per HPI unless specifically indicated above   Allergies as of 04/22/2017   No Known Allergies     Medication List        Accurate as of 04/22/17  9:27 AM. Always use your most recent med list.          albuterol 108 (90 Base) MCG/ACT inhaler Commonly known as:  PROVENTIL HFA;VENTOLIN HFA Inhale 2 puffs into the lungs every 6 (six) hours as needed for wheezing or shortness of breath.   aspirin EC 81 MG  tablet Take 81 mg by mouth.   atorvastatin 80 MG tablet Commonly known as:  LIPITOR TAKE ONE TABLET BY MOUTH ONCE DAILY   cephALEXin 500 MG capsule Commonly known as:  KEFLEX Take 500 mg by mouth 3 (three) times daily. X 10 days   isosorbide mononitrate 60 MG 24 hr tablet Commonly known as:  IMDUR Take 1 tablet (60 mg total) by mouth daily. Dose increased 04/24/2016   Linagliptin-metFORMIN HCl ER 06-998 MG Tb24 Take 1 tablet by mouth daily.   lisinopril 10 MG tablet Commonly known as:  PRINIVIL,ZESTRIL Take 1 tablet (10 mg total) by mouth daily.   metoprolol succinate 25 MG 24 hr tablet Commonly known as:  TOPROL XL Take 1.5 tablets (37.5 mg total) by mouth daily.   sulfamethoxazole-trimethoprim 800-160 MG tablet Commonly known as:  BACTRIM DS,SEPTRA DS Take 1 tablet by mouth 2 (two) times daily.   UNABLE TO FIND Vit C 2000 daily          Objective:    BP 106/66   Pulse 86   Temp (!) 96.8 F (36 C) (Oral)   Ht 5\' 11"  (1.803 m)   Wt 283 lb (128.4 kg)   BMI 39.47 kg/m   Wt Readings from Last 3 Encounters:  04/22/17 283 lb (128.4 kg)  04/16/17 285 lb (129.3 kg)  04/02/17 285 lb 9.6 oz (129.5 kg)    Physical Exam  Constitutional: He is oriented to person, place, and time. He appears well-developed and well-nourished. No distress.  Eyes: Conjunctivae are normal. No scleral icterus.  Musculoskeletal: Normal range of motion. He exhibits no edema.  Neurological: He is alert and oriented to person, place, and time. Coordination normal.  Skin: Skin is warm and dry. Lesion (Black medial nail bed base from silver nitrate, small amount of yellow thin drainage with minimal erythema on paronychia.) noted. No rash noted. He is not diaphoretic.  Psychiatric: He has a normal mood and affect. His behavior is normal.  Nursing note and vitals reviewed.       Assessment & Plan:   Problem List Items Addressed This Visit    None    Visit Diagnoses    Cellulitis of toe,  unspecified laterality    -  Primary   Status post partial nail plate removal bilaterally, small amount of erythema and drainage bilaterally   Relevant Medications   sulfamethoxazole-trimethoprim (BACTRIM DS,SEPTRA DS) 800-160 MG tablet       Follow up plan: Return if symptoms worsen or fail to improve.  Counseling provided for all of the vaccine components No orders of the defined types were placed in this encounter.   Caryl Pina, MD [facility] 04/22/2017, 9:27 AM

## 2017-05-07 ENCOUNTER — Ambulatory Visit: Payer: BLUE CROSS/BLUE SHIELD | Admitting: Family Medicine

## 2017-05-07 ENCOUNTER — Encounter: Payer: Self-pay | Admitting: Family Medicine

## 2017-05-07 VITALS — BP 113/80 | HR 88 | Temp 97.2°F | Ht 71.0 in | Wt 285.0 lb

## 2017-05-07 DIAGNOSIS — L03039 Cellulitis of unspecified toe: Secondary | ICD-10-CM

## 2017-05-07 MED ORDER — LEVOFLOXACIN 500 MG PO TABS: 500.0000 mg | ORAL_TABLET | Freq: Every day | ORAL | 0 refills | Status: DC

## 2017-05-07 NOTE — Progress Notes (Signed)
BP 113/80   Pulse 88   Temp (!) 97.2 F (36.2 C) (Oral)   Ht 5\' 11"  (1.803 m)   Wt 285 lb (129.3 kg)   BMI 39.75 kg/m    Subjective:    Patient ID: Carl [name], male    DOB: [birth year], 42 y.o.   MRN: [id number]  HPI: Carl [name] is a 42 y.o. male presenting on 05/07/2017 for Cellulitis of toe (follow up)   HPI Recheck cellulitis status post toenail removal Patient comes in for recheck of his cellulitis and his toe status post toe nail removal.  He had both partial plates removed and says that they have been improving over the past week but he still has some slight tenderness there is still having some drainage out of the nailbed but is thick in nature.  He denies any fevers or chills or pain radiating anywhere else except for out of that nail bed base.  He has been sick and triple antibiotic ointment on it as well and mostly keeping it open at home and covered during the day  Relevant past medical, surgical, family and social history reviewed and updated as indicated. Interim medical history since our last visit reviewed. Allergies and medications reviewed and updated.  Review of Systems  Constitutional: Negative for chills and fever.  Respiratory: Negative for shortness of breath and wheezing.   Cardiovascular: Negative for chest pain and leg swelling.  Musculoskeletal: Negative for back pain and gait problem.  Skin: Positive for color change and wound. Negative for rash.  All other systems reviewed and are negative.   Per HPI unless specifically indicated above   Allergies as of 05/07/2017   No Known Allergies     Medication List        Accurate as of 05/07/17  9:46 AM. Always use your most recent med list.          albuterol 108 (90 Base) MCG/ACT inhaler Commonly known as:  PROVENTIL HFA;VENTOLIN HFA Inhale 2 puffs into the lungs every 6 (six) hours as needed for wheezing or shortness of breath.   aspirin EC 81 MG tablet Take 81 mg by mouth.     atorvastatin 80 MG tablet Commonly known as:  LIPITOR TAKE ONE TABLET BY MOUTH ONCE DAILY   isosorbide mononitrate 60 MG 24 hr tablet Commonly known as:  IMDUR Take 1 tablet (60 mg total) by mouth daily. Dose increased 04/24/2016   levofloxacin 500 MG tablet Commonly known as:  LEVAQUIN Take 1 tablet (500 mg total) by mouth daily.   Linagliptin-metFORMIN HCl ER 06-998 MG Tb24 Take 1 tablet by mouth daily.   lisinopril 10 MG tablet Commonly known as:  PRINIVIL,ZESTRIL Take 1 tablet (10 mg total) by mouth daily.   metoprolol succinate 25 MG 24 hr tablet Commonly known as:  TOPROL XL Take 1.5 tablets (37.5 mg total) by mouth daily.   UNABLE TO FIND Vit C 2000 daily          Objective:    BP 113/80   Pulse 88   Temp (!) 97.2 F (36.2 C) (Oral)   Ht 5\' 11"  (1.803 m)   Wt 285 lb (129.3 kg)   BMI 39.75 kg/m   Wt Readings from Last 3 Encounters:  05/07/17 285 lb (129.3 kg)  04/22/17 283 lb (128.4 kg)  04/16/17 285 lb (129.3 kg)    Physical Exam  Constitutional: He is oriented to person, place, and time. He appears well-developed and well-nourished. No distress.  Eyes: Conjunctivae are normal. No scleral icterus.  Neurological: He is alert and oriented to person, place, and time. Coordination normal.  Skin: Skin is warm and dry. No rash noted. He is not diaphoretic. There is erythema (Small amount of erythema on paronychia on medial aspect of both great toes, small amount of purulent drainage out of the nailbed, appears to be improved from last time).  Psychiatric: He has a normal mood and affect. His behavior is normal.  Nursing note and vitals reviewed.       Assessment & Plan:   Problem List Items Addressed This Visit    None    Visit Diagnoses    Cellulitis of toe, unspecified laterality    -  Primary   Mild inflammation around the base of nail bed with a small amount of thick yellow discharge, will do another course of antibiotic   Relevant Medications    levofloxacin (LEVAQUIN) 500 MG tablet       Follow up plan: Return in about 2 weeks (around 05/21/2017), or if symptoms worsen or fail to improve, for Recheck toenails.  Counseling provided for all of the vaccine components No orders of the defined types were placed in this encounter.   Caryl Pina, MD [facility] 05/07/2017, 9:46 AM

## 2017-05-11 ENCOUNTER — Other Ambulatory Visit: Payer: Self-pay | Admitting: Family Medicine

## 2017-05-11 MED ORDER — LINAGLIPTIN-METFORMIN HCL ER 5-1000 MG PO TB24: 1.0000 | ORAL_TABLET | Freq: Every day | ORAL | 0 refills | Status: DC

## 2017-05-11 NOTE — Telephone Encounter (Signed)
Pt aware refill sent to pharmacy 

## 2017-05-11 NOTE — Telephone Encounter (Signed)
What is the name of the medication? Linagliptin-Metformin 1000 mg #90 day  Have you contacted your pharmacy to request a refill? YES  Which pharmacy would you like this sent to? [facility] -- Patient has a new pharmacy   Patient notified that their request is being sent to the clinical staff for review and that they should receive a call once it is complete. If they do not receive a call within 24 hours they can check with their pharmacy or our office.

## 2017-05-21 ENCOUNTER — Other Ambulatory Visit: Payer: Self-pay | Admitting: Cardiology

## 2017-05-21 ENCOUNTER — Encounter: Payer: Self-pay | Admitting: Family Medicine

## 2017-05-21 ENCOUNTER — Ambulatory Visit: Payer: BLUE CROSS/BLUE SHIELD | Admitting: Family Medicine

## 2017-05-21 ENCOUNTER — Other Ambulatory Visit: Payer: Self-pay | Admitting: *Deleted

## 2017-05-21 VITALS — BP 106/74 | HR 86 | Temp 97.4°F | Ht 71.0 in | Wt 283.0 lb

## 2017-05-21 DIAGNOSIS — L600 Ingrowing nail: Secondary | ICD-10-CM

## 2017-05-21 MED ORDER — ISOSORBIDE MONONITRATE ER 60 MG PO TB24: 60.0000 mg | ORAL_TABLET | Freq: Every day | ORAL | 3 refills | Status: DC

## 2017-05-21 NOTE — Progress Notes (Signed)
BP 106/74   Pulse 86   Temp (!) 97.4 F (36.3 C) (Oral)   Ht 5\' 11"  (1.803 m)   Wt 283 lb (128.4 kg)   BMI 39.47 kg/m    Subjective:    Patient ID: Carl [name], male    DOB: [birth year], 42 y.o.   MRN: [id number]  HPI: Carl [name] is a 42 y.o. male presenting on 05/21/2017 for Follow up cellulitis of toe   HPI Follow-up cellulitis and ingrown toenails Patient is coming in for follow-up of cellulitis and ingrown toenails.  He says the pain for more remove the toenails and he was having the drainage is gone and the swelling is gone and there is no more drainage.  He really feels like they are doing a lot better today.  He denies any fevers or chills or redness or warmth.  He is starting to little bit of an ingrown toenail on his right great toe on the lateral side now.  Relevant past medical, surgical, family and social history reviewed and updated as indicated. Interim medical history since our last visit reviewed. Allergies and medications reviewed and updated.  Review of Systems  Constitutional: Negative for chills and fever.  Respiratory: Negative for shortness of breath and wheezing.   Cardiovascular: Negative for chest pain and leg swelling.  Musculoskeletal: Negative for back pain and gait problem.  Skin: Negative for color change, rash and wound.  All other systems reviewed and are negative.   Per HPI unless specifically indicated above   Allergies as of 05/21/2017   No Known Allergies     Medication List        Accurate as of 05/21/17  8:38 AM. Always use your most recent med list.          albuterol 108 (90 Base) MCG/ACT inhaler Commonly known as:  PROVENTIL HFA;VENTOLIN HFA Inhale 2 puffs into the lungs every 6 (six) hours as needed for wheezing or shortness of breath.   aspirin EC 81 MG tablet Take 81 mg by mouth.   atorvastatin 80 MG tablet Commonly known as:  LIPITOR TAKE ONE TABLET BY MOUTH ONCE DAILY   isosorbide mononitrate 60 MG 24 hr  tablet Commonly known as:  IMDUR Take 1 tablet (60 mg total) by mouth daily. Dose increased 04/24/2016   Linagliptin-metFORMIN HCl ER 06-998 MG Tb24 Take 1 tablet by mouth daily.   lisinopril 10 MG tablet Commonly known as:  PRINIVIL,ZESTRIL Take 1 tablet (10 mg total) by mouth daily.   metoprolol succinate 25 MG 24 hr tablet Commonly known as:  TOPROL XL Take 1.5 tablets (37.5 mg total) by mouth daily.   UNABLE TO FIND Vit C 2000 daily          Objective:    BP 106/74   Pulse 86   Temp (!) 97.4 F (36.3 C) (Oral)   Ht 5\' 11"  (1.803 m)   Wt 283 lb (128.4 kg)   BMI 39.47 kg/m   Wt Readings from Last 3 Encounters:  05/21/17 283 lb (128.4 kg)  05/07/17 285 lb (129.3 kg)  04/22/17 283 lb (128.4 kg)    Physical Exam  Constitutional: He is oriented to person, place, and time. He appears well-developed and well-nourished. No distress.  Eyes: Conjunctivae are normal. No scleral icterus.  Neurological: He is alert and oriented to person, place, and time. Coordination normal.  Skin: Skin is warm and dry. No rash noted. He is not diaphoretic.  Both nailbeds appear to  be healed well and no drainage or erythema or warmth or tenderness is noted.  The nail plates have not grown back which was planned  Psychiatric: He has a normal mood and affect. His behavior is normal.  Nursing note and vitals reviewed.       Assessment & Plan:   Problem List Items Addressed This Visit    None    Visit Diagnoses    Ingrown nail of great toe of left foot    -  Primary   Follow-up, both appear great, will let him go on his own recognizance and return as needed   Ingrown nail of great toe of right foot           Follow up plan: Return in about 1 month (around 06/18/2017), or if symptoms worsen or fail to improve, for Diabetes recheck.  Counseling provided for all of the vaccine components No orders of the defined types were placed in this encounter.   Caryl Pina, MD [facility] 05/21/2017, 8:38 AM

## 2017-05-21 NOTE — Telephone Encounter (Signed)
Patient walk in    *STAT* If patient is at the pharmacy, call can be transferred to refill team.   1. Which medications need to be refilled? isosorbide mononitrate (IMDUR) 60 MG 24 hr tablet    2. Which pharmacy/location (including street and city if local pharmacy) is medication to be sent to[place] [facility]   3. Do they need a 30 day or 90 day supply? [place]

## 2017-05-21 NOTE — Telephone Encounter (Signed)
Done

## 2017-06-22 ENCOUNTER — Ambulatory Visit: Payer: BLUE CROSS/BLUE SHIELD | Admitting: Family Medicine

## 2017-07-16 ENCOUNTER — Encounter: Payer: Self-pay | Admitting: Physician Assistant

## 2017-07-16 ENCOUNTER — Ambulatory Visit: Payer: BLUE CROSS/BLUE SHIELD | Admitting: Physician Assistant

## 2017-07-16 ENCOUNTER — Ambulatory Visit: Payer: BLUE CROSS/BLUE SHIELD | Admitting: Family Medicine

## 2017-07-16 ENCOUNTER — Encounter: Payer: Self-pay | Admitting: *Deleted

## 2017-07-16 VITALS — BP 106/68 | HR 96 | Ht 71.0 in | Wt 283.8 lb

## 2017-07-16 DIAGNOSIS — I10 Essential (primary) hypertension: Secondary | ICD-10-CM

## 2017-07-16 DIAGNOSIS — Z720 Tobacco use: Secondary | ICD-10-CM

## 2017-07-16 DIAGNOSIS — E1169 Type 2 diabetes mellitus with other specified complication: Secondary | ICD-10-CM

## 2017-07-16 DIAGNOSIS — E118 Type 2 diabetes mellitus with unspecified complications: Secondary | ICD-10-CM | POA: Diagnosis not present

## 2017-07-16 DIAGNOSIS — E785 Hyperlipidemia, unspecified: Secondary | ICD-10-CM

## 2017-07-16 DIAGNOSIS — I25118 Atherosclerotic heart disease of native coronary artery with other forms of angina pectoris: Secondary | ICD-10-CM | POA: Diagnosis not present

## 2017-07-16 DIAGNOSIS — E1159 Type 2 diabetes mellitus with other circulatory complications: Secondary | ICD-10-CM | POA: Diagnosis not present

## 2017-07-16 DIAGNOSIS — H00014 Hordeolum externum left upper eyelid: Secondary | ICD-10-CM

## 2017-07-16 DIAGNOSIS — I152 Hypertension secondary to endocrine disorders: Secondary | ICD-10-CM

## 2017-07-16 DIAGNOSIS — E669 Obesity, unspecified: Secondary | ICD-10-CM

## 2017-07-16 LAB — BAYER DCA HB A1C WAIVED: HB A1C: 6.1 % (ref <7.0)

## 2017-07-16 MED ORDER — NITROGLYCERIN 0.4 MG SL SUBL: 0.4000 mg | SUBLINGUAL_TABLET | SUBLINGUAL | 3 refills | Status: DC | PRN

## 2017-07-16 MED ORDER — POLYMYXIN B-TRIMETHOPRIM 10000-0.1 UNIT/ML-% OP SOLN: 1.0000 [drp] | OPHTHALMIC | 0 refills | Status: DC

## 2017-07-16 MED ORDER — METOPROLOL SUCCINATE ER 50 MG PO TB24: 50.0000 mg | ORAL_TABLET | Freq: Every day | ORAL | 1 refills | Status: DC

## 2017-07-16 NOTE — Progress Notes (Signed)
Cardiology Office Note    Date:  07/16/2017  ID:  FRANKLIN BAUMBACH, DOB [birth year], MRN [id number] PCP:  Dettinger, Fransisca Kaufmann, MD  Cardiologist:  Carlyle Dolly, MD  Chief Complaint: f/u angina  History of Present Illness:  Carl [name] is a 42 y.o. male with history of CAD, HTN, HLD, DM who presents for 4 month follow-up. He has hx of cath in 2015 [facility] showing occluded proximal LAD, LAD filled with left-left and right-left collaterals, otherwise 20-30% prox-mid RCA, LVEF 55%. He has been managed medically since that time. 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 2D echo same time showed EF 92-42%, normal diastolic function. This was managed medically At last OV 03/2017 his Toprol was titrated for chronic stable angina. Per Dr. Harl Bowie, if he has progression of symptoms would plan for cath. Last labs 02/2017 showed LDL 55, Cr 1.03, K 4.2, A1C 6.1; TSH 2017 wnl, no recent CBC on file.  He presents back for follow-up reporting increasing angina. He is a Administrator. 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 More recently it has been approximately 6-10 times a month, occurring primarily with exertion such as lifting loads on his job or self-sexual activity. It is rare that the episodes happen unprovoked. He has not had any syncope, palpitations, vomiting. He reports compliance with meds. He saw PCP today and BP was 99/67. He is very concerned about this because for a long time, his "at the upper limits of normal at 180/120." We did discuss that this was well beyond normal. He has had some dizziness and lightheadedness. Today he feels well and is chest pain free. He says the pain seems to come with "good weeks and bad weeks." He does not currently feel dizzy. He is originally from [place] area.   Past Medical History:  Diagnosis Date  . CAD in native artery    a. cath in 2015  [facility] showing occluded proximal LAD, LAD filled with left-left and right-left collaterals, otherwise 20-30% prox-mid RCA, LVEF 55%. b. Abnormal nuc 02/2016 - [facility] medically.  . Diabetes mellitus type 2 in obese ([place])   . Hyperlipidemia   . Hypertension     Past Surgical History:  Procedure Laterality Date  . SKIN BIOPSY    . TUMOR EXCISION     left knee area    Current Medications: Current Meds  Medication Sig  . albuterol (PROVENTIL HFA;VENTOLIN HFA) 108 (90 Base) MCG/ACT inhaler Inhale 2 puffs into the lungs every 6 (six) hours as needed for wheezing or shortness of breath.  Marland Kitchen aspirin EC 81 MG tablet Take 81 mg by mouth.  Marland Kitchen atorvastatin (LIPITOR) 80 MG tablet TAKE ONE TABLET BY MOUTH ONCE DAILY  . isosorbide mononitrate (IMDUR) 60 MG 24 hr tablet Take 1 tablet (60 mg total) by mouth daily. Dose increased 04/24/2016  . Linagliptin-metFORMIN HCl ER 06-998 MG TB24 Take 1 tablet by mouth daily.  Marland Kitchen lisinopril (PRINIVIL,ZESTRIL) 10 MG tablet Take 1 tablet (10 mg total) by mouth daily.  . metoprolol succinate (TOPROL XL) 25 MG 24 hr tablet Take 1.5 tablets (37.5 mg total) by mouth daily.  Marland Kitchen trimethoprim-polymyxin b (POLYTRIM) ophthalmic solution Place 1 drop into both eyes every 4 (four) hours. Give enough for 7 days  . UNABLE TO FIND Vit C 2000 daily    Allergies:   Patient has no known allergies.   Social History  Socioeconomic History  . Marital status: Divorced    Spouse name: Not on file  . Number of children: Not on file  . Years of education: Not on file  . Highest education level: Not on file  Occupational History  . Not on file  Social Needs  . Financial resource strain: Not on file  . Food insecurity:    Worry: Not on file    Inability: Not on file  . Transportation needs:    Medical: Not on file    Non-medical: Not on file  Tobacco Use  . Smoking status: Current Every Day Smoker    Packs/day: 2.50    Types: Cigarettes    Start date: 01/03/1993  . Smokeless  tobacco: Never Used  Substance and Sexual Activity  . Alcohol use: Yes    Comment: occasionally  . Drug use: No  . Sexual activity: Not on file  Lifestyle  . Physical activity:    Days per week: Not on file    Minutes per session: Not on file  . Stress: Not on file  Relationships  . Social connections:    Talks on phone: Not on file    Gets together: Not on file    Attends religious service: Not on file    Active member of club or organization: Not on file    Attends meetings of clubs or organizations: Not on file    Relationship status: Not on file  Other Topics Concern  . Not on file  Social History Narrative  . Not on file     Family History:  The patient's *family history includes Diabetes in his father and mother.  ROS:   Please see the history of present illness. All other systems are reviewed and otherwise negative.    PHYSICAL EXAM:   VS:  BP 106/68   Pulse 96   Ht 5\' 11"  (1.803 m)   Wt 283 lb 12.8 oz (128.7 kg)   SpO2 95%   BMI 39.58 kg/m   BMI: Body mass index is 39.58 kg/m. GEN: Well nourished, well developed obese WM, in no acute distress HEENT: normocephalic, atraumatic Neck: no JVD, carotid bruits, or masses Cardiac: RRR; no murmurs, rubs, or gallops, no edema  Respiratory:  clear to auscultation bilaterally, normal work of breathing GI: soft, nontender, nondistended, + BS MS: no deformity or atrophy Skin: warm and dry, no rash Neuro:  Alert and Oriented x 3, Strength and sensation are intact, follows commands Psych: euthymic mood, full affect  Wt Readings from Last 3 Encounters:  07/16/17 283 lb 12.8 oz (128.7 kg)  07/16/17 283 lb (128.4 kg)  05/21/17 283 lb (128.4 kg)      Studies/Labs Reviewed:   EKG:  EKG was ordered today and personally reviewed by me and demonstrates sinus tach 104bpm, no acute St-T changes  Recent Labs: 03/15/2017: ALT 18; BUN 10; Creatinine, Ser 1.03; Potassium 4.2; Sodium 140   Lipid Panel    Component Value  Date/Time   CHOL 108 03/15/2017 0939   TRIG 136 03/15/2017 0939   HDL 26 (L) 03/15/2017 0939   CHOLHDL 4.2 03/15/2017 0939   LDLCALC 55 03/15/2017 0939    Additional studies/ records that were reviewed today include: Summarized above.    ASSESSMENT & PLAN:   1. CAD with accelerating angina - he has had slow increase in symptoms over the last few months. Med titration has been limited by his BP. Cath 2015 showed occluded LAD with residual mild RCA disease.  Dr. Harl Bowie suggested in his last note that if symptoms progressed, would consider cath. I think this is indicated at this time. I wonder if he would be a candidate for CTO of the LAD if appropriate. We also need to make sure his right system, which helps fill collaterals, has not been compromised. I reviewed with Dr. Domenic Polite in Dr. Nelly Laurence absence and he agrees. We tried to schedule it with either Dr. Irish Lack and Dr. Martinique but they do not have any available cath days in the upcoming week (already full on Tues). The patient does not want to put the cath off longer than necessary - will arrange with Dr. Tamala Julian next available spot. Risks and benefits of cardiac catheterization have been discussed with the patient.  These include bleeding, infection, kidney damage, stroke, heart attack, death.  The patient understands these risks and is willing to proceed. Warning sx reviewed with patient. He was given instructions on holding metformin prior to test. Will rx SL NTG. Will stop lisinopril given softer BP and titrate Toprol to 50mg  as he would benefit from additional beta blockade. ER precautions reviewed with patient - if any escalation of symptoms between now and cath, he understands to seek medical attention. Given his work I have instructed him that he needs to remain out of work until cleared by cardiologist pending cath results. A work note was given. PCP checked CMET, lipids today - will get CBC and TSH as well given HR upper limits of normal.    2. HTN - see above regarding med changes. 3. Hyperlipidemia - PCP checked lipids today. If LFTs are OK and LDL is >70 would recommend addition of Zetia or referral to pharmD to consider PCSK9. 4. Diabetes in obese - followed by primary care.  Disposition: F/u with Dr. Christella Noa 2 weeks.   Medication Adjustments/Labs and Tests Ordered: Current medicines are reviewed at length with the patient today.  Concerns regarding medicines are outlined above. Medication changes, Labs and Tests ordered today are summarized above and listed in the Patient Instructions accessible in Encounters.   Signed, Carl Pitter, PA-C  07/16/2017 3:56 PM    [facility] Location in [place], [place] [address] Ph: [phone]; Fax [phone]

## 2017-07-16 NOTE — Progress Notes (Signed)
BP 99/67   Pulse 89   Temp (!) 97.3 F (36.3 C) (Oral)   Ht '5\' 11"'  (1.803 m)   Wt 283 lb (128.4 kg)   BMI 39.47 kg/m    Subjective:    Patient ID: Carl [name], male    DOB: 12-Sep-1975, 42 y.o.   MRN: [id number]  HPI: Carl [name] is a 42 y.o. male presenting on 07/16/2017 for Diabetes; Hypertension; and Hyperlipidemia   HPI Hypertension Patient is currently on Imdur and metoprolol and lisinopril, and their blood pressure today is 99/67. Patient has had lightheadedness or dizziness at times. Patient denies headaches, blurred vision, shortness of breath, or weakness. Denies any side effects from medication and is content with current medication.  Patient does get intermittent chest pains that occur at rest and activity.  He denies having any chest pains today.  He does have an appointment with cardiology this afternoon.  Type 2 diabetes mellitus Patient comes in today for recheck of his diabetes. Patient has been currently taking Janumet. Patient is currently on an ACE inhibitor/ARB. Patient has not seen an ophthalmologist this year. Patient denies any issues with their feet.   Hyperlipidemia Patient is coming in for recheck of his hyperlipidemia. The patient is currently taking atorvastatin. They deny any issues with myalgias or history of liver damage from it. They deny any focal numbness or weakness or chest pain.   Relevant past medical, surgical, family and social history reviewed and updated as indicated. Interim medical history since our last visit reviewed. Allergies and medications reviewed and updated.  Review of Systems  Constitutional: Negative for chills and fever.  Respiratory: Negative for shortness of breath and wheezing.   Cardiovascular: Negative for chest pain and leg swelling.  Musculoskeletal: Negative for back pain and gait problem.  Skin: Negative for rash.  Neurological: Negative for dizziness, weakness, light-headedness and numbness.  All other systems  reviewed and are negative.   Per HPI unless specifically indicated above   Allergies as of 07/16/2017   No Known Allergies     Medication List        Accurate as of 07/16/17  1:22 PM. Always use your most recent med list.          albuterol 108 (90 Base) MCG/ACT inhaler Commonly known as:  PROVENTIL HFA;VENTOLIN HFA Inhale 2 puffs into the lungs every 6 (six) hours as needed for wheezing or shortness of breath.   aspirin EC 81 MG tablet Take 81 mg by mouth.   atorvastatin 80 MG tablet Commonly known as:  LIPITOR TAKE ONE TABLET BY MOUTH ONCE DAILY   isosorbide mononitrate 60 MG 24 hr tablet Commonly known as:  IMDUR Take 1 tablet (60 mg total) by mouth daily. Dose increased 04/24/2016   linaGLIPtin-metFORMIN HCl ER 06-998 MG Tb24 Take 1 tablet by mouth daily.   lisinopril 10 MG tablet Commonly known as:  PRINIVIL,ZESTRIL Take 1 tablet (10 mg total) by mouth daily.   metoprolol succinate 25 MG 24 hr tablet Commonly known as:  TOPROL XL Take 1.5 tablets (37.5 mg total) by mouth daily.   UNABLE TO FIND Vit C 2000 daily          Objective:    BP 99/67   Pulse 89   Temp (!) 97.3 F (36.3 C) (Oral)   Ht '5\' 11"'  (1.803 m)   Wt 283 lb (128.4 kg)   BMI 39.47 kg/m   Wt Readings from Last 3 Encounters:  07/16/17 283 lb (  128.4 kg)  05/21/17 283 lb (128.4 kg)  05/07/17 285 lb (129.3 kg)    Physical Exam  Constitutional: He is oriented to person, place, and time. He appears well-developed and well-nourished. No distress.  Eyes: Pupils are equal, round, and reactive to light. Conjunctivae and EOM are normal. Right eye exhibits hordeolum. Left eye exhibits hordeolum. No scleral icterus.  Neck: Neck supple. No thyromegaly present.  Cardiovascular: Normal rate, regular rhythm, normal heart sounds and intact distal pulses.  No murmur heard. Pulmonary/Chest: Effort normal and breath sounds normal. No respiratory distress. He has no wheezes.  Musculoskeletal: Normal  range of motion. He exhibits no edema.  Lymphadenopathy:    He has no cervical adenopathy.  Neurological: He is alert and oriented to person, place, and time. Coordination normal.  Skin: Skin is warm and dry. No rash noted. He is not diaphoretic.  Psychiatric: He has a normal mood and affect. His behavior is normal.  Nursing note and vitals reviewed.       Assessment & Plan:   Problem List Items Addressed This Visit      Cardiovascular and Mediastinum   Hypertension associated with diabetes ([place])   Relevant Orders   CMP14+EGFR     Endocrine   Hyperlipidemia associated with type 2 diabetes mellitus ([place])   Relevant Orders   Lipid panel   Controlled diabetes mellitus type 2 with complications ([place])   Relevant Orders   Bayer DCA Hb A1c Waived     Other   Tobacco abuse   Morbid obesity ([place]) - Primary    Other Visit Diagnoses    Hordeolum externum of left upper eyelid       Patient has a stye left upper eyelid, also concern for starting 1 and other.  Will give drops for antibiotics   Relevant Medications   trimethoprim-polymyxin b (POLYTRIM) ophthalmic solution      Patient still has no desire to quit smoking  Continue medications for diabetes, sounds like is going well  Patient has been having some intermittent chest pains and angina, he sees cardiology this afternoon at 3 and he will discuss this with them.  Cardiology also manage his blood pressure medication and recommended because of his lightheadedness that it may need to be reduced but he will talk about this with them. Follow up plan: Return in about 3 months (around 10/16/2017), or if symptoms worsen or fail to improve, for Diabetes and hypertension recheck.  Counseling provided for all of the vaccine components Orders Placed This Encounter  Procedures  . Bayer DCA Hb A1c Waived  . CMP14+EGFR  . Lipid panel    Caryl Pina, MD [facility] 07/16/2017, 1:22 PM

## 2017-07-16 NOTE — Patient Instructions (Addendum)
Your physician recommends that you schedule a follow-up appointment in: [place] has recommended you make the following change in your medication:   STOP LISINOPRIL   INCREASE TOPROL XL 50 MG DAILY   Your physician recommends that you return for lab work TODAY CBC/TSH  Your physician has requested that you have a cardiac catheterization. Cardiac catheterization is used to diagnose and/or treat various heart conditions. Doctors may recommend this procedure for a number of different reasons. The most common reason is to evaluate chest pain. Chest pain can be a symptom of coronary artery disease (CAD), and cardiac catheterization can show whether plaque is narrowing or blocking your heart's arteries. This procedure is also used to evaluate the valves, as well as measure the blood flow and oxygen levels in different parts of your heart. For further information please visit [email or web address]. Please follow instruction sheet, as given.  Thank you for choosing [facility]!!

## 2017-07-17 LAB — LIPID PANEL
Chol/HDL Ratio: 3.8 ratio (ref 0.0–5.0)
Cholesterol, Total: 109 mg/dL (ref 100–199)
HDL: 29 mg/dL — AB (ref >39)
LDL Calculated: 59 mg/dL (ref 0–99)
Triglycerides: 105 mg/dL (ref 0–149)
VLDL Cholesterol Cal: 21 mg/dL (ref 5–40)

## 2017-07-17 LAB — CMP14+EGFR
ALBUMIN: 4.4 g/dL (ref 3.5–5.5)
ALT: 20 IU/L (ref 0–44)
AST: 19 IU/L (ref 0–40)
Albumin/Globulin Ratio: 1.6 (ref 1.2–2.2)
Alkaline Phosphatase: 73 IU/L (ref 39–117)
BUN/Creatinine Ratio: 11 (ref 9–20)
BUN: 11 mg/dL (ref 6–24)
Bilirubin Total: 0.6 mg/dL (ref 0.0–1.2)
CALCIUM: 9.5 mg/dL (ref 8.7–10.2)
CHLORIDE: 104 mmol/L (ref 96–106)
CO2: 22 mmol/L (ref 20–29)
CREATININE: 1.02 mg/dL (ref 0.76–1.27)
GFR, EST AFRICAN AMERICAN: 104 mL/min/{1.73_m2} (ref >59)
GFR, EST NON AFRICAN AMERICAN: 90 mL/min/{1.73_m2} (ref >59)
GLUCOSE: 104 mg/dL — AB (ref 65–99)
Globulin, Total: 2.7 g/dL (ref 1.5–4.5)
Potassium: 4.8 mmol/L (ref 3.5–5.2)
Sodium: 140 mmol/L (ref 134–144)
TOTAL PROTEIN: 7.1 g/dL (ref 6.0–8.5)

## 2017-07-20 ENCOUNTER — Telehealth: Payer: Self-pay

## 2017-07-20 ENCOUNTER — Other Ambulatory Visit (HOSPITAL_COMMUNITY)
Admission: RE | Admit: 2017-07-20 | Discharge: 2017-07-20 | Disposition: A | Discharge status: Home / Self Care | Payer: BLUE CROSS/BLUE SHIELD | Source: Ambulatory Visit | Attending: Physician Assistant | Admitting: Physician Assistant

## 2017-07-20 DIAGNOSIS — I25118 Atherosclerotic heart disease of native coronary artery with other forms of angina pectoris: Secondary | ICD-10-CM | POA: Insufficient documentation

## 2017-07-20 LAB — CBC
HCT: 42.8 % (ref 39.0–52.0)
Hemoglobin: 14.3 g/dL (ref 13.0–17.0)
MCH: 31.2 pg (ref 26.0–34.0)
MCHC: 33.4 g/dL (ref 30.0–36.0)
MCV: 93.4 fL (ref 78.0–100.0)
PLATELETS: 183 10*3/uL (ref 150–400)
RBC: 4.58 MIL/uL (ref 4.22–5.81)
RDW: 12.7 % (ref 11.5–15.5)
WBC: 9.3 10*3/uL (ref 4.0–10.5)

## 2017-07-20 LAB — TSH: TSH: 2.368 u[IU]/mL (ref 0.350–4.500)

## 2017-07-20 NOTE — Telephone Encounter (Signed)
Patient contacted pre-catheterization at [facility] scheduled for:  07/21/2017 at 1300 Verified arrival time and place:  NT @ 1100 Confirmed AM meds to be taken pre-cath with sip of water: Take ASA Hold linagliptin/metformin Pt getting lab work this am

## 2017-07-21 ENCOUNTER — Telehealth: Payer: Self-pay

## 2017-07-21 ENCOUNTER — Telehealth: Payer: Self-pay | Admitting: *Deleted

## 2017-07-21 ENCOUNTER — Ambulatory Visit (HOSPITAL_COMMUNITY)
Admission: RE | Admit: 2017-07-21 | Discharge: 2017-07-21 | Disposition: A | Discharge status: Home / Self Care | Payer: BLUE CROSS/BLUE SHIELD | Source: Ambulatory Visit | Attending: Interventional Cardiology | Admitting: Interventional Cardiology

## 2017-07-21 ENCOUNTER — Encounter (HOSPITAL_COMMUNITY)
Admission: RE | Disposition: A | Discharge status: Home / Self Care | Payer: Self-pay | Source: Ambulatory Visit | Attending: Interventional Cardiology

## 2017-07-21 DIAGNOSIS — E785 Hyperlipidemia, unspecified: Secondary | ICD-10-CM | POA: Diagnosis not present

## 2017-07-21 DIAGNOSIS — Z6841 Body Mass Index (BMI) 40.0 and over, adult: Secondary | ICD-10-CM | POA: Diagnosis not present

## 2017-07-21 DIAGNOSIS — I2510 Atherosclerotic heart disease of native coronary artery without angina pectoris: Secondary | ICD-10-CM

## 2017-07-21 DIAGNOSIS — I209 Angina pectoris, unspecified: Secondary | ICD-10-CM | POA: Diagnosis present

## 2017-07-21 DIAGNOSIS — E118 Type 2 diabetes mellitus with unspecified complications: Secondary | ICD-10-CM | POA: Diagnosis present

## 2017-07-21 DIAGNOSIS — E119 Type 2 diabetes mellitus without complications: Secondary | ICD-10-CM | POA: Insufficient documentation

## 2017-07-21 DIAGNOSIS — F17210 Nicotine dependence, cigarettes, uncomplicated: Secondary | ICD-10-CM | POA: Insufficient documentation

## 2017-07-21 DIAGNOSIS — I10 Essential (primary) hypertension: Secondary | ICD-10-CM | POA: Diagnosis not present

## 2017-07-21 DIAGNOSIS — I2582 Chronic total occlusion of coronary artery: Secondary | ICD-10-CM | POA: Diagnosis not present

## 2017-07-21 DIAGNOSIS — R0609 Other forms of dyspnea: Secondary | ICD-10-CM | POA: Diagnosis present

## 2017-07-21 DIAGNOSIS — I25119 Atherosclerotic heart disease of native coronary artery with unspecified angina pectoris: Secondary | ICD-10-CM | POA: Diagnosis not present

## 2017-07-21 DIAGNOSIS — I25118 Atherosclerotic heart disease of native coronary artery with other forms of angina pectoris: Secondary | ICD-10-CM | POA: Diagnosis not present

## 2017-07-21 DIAGNOSIS — I25720 Atherosclerosis of autologous artery coronary artery bypass graft(s) with unstable angina pectoris: Secondary | ICD-10-CM

## 2017-07-21 DIAGNOSIS — Z7982 Long term (current) use of aspirin: Secondary | ICD-10-CM | POA: Insufficient documentation

## 2017-07-21 DIAGNOSIS — I152 Hypertension secondary to endocrine disorders: Secondary | ICD-10-CM | POA: Diagnosis present

## 2017-07-21 DIAGNOSIS — G4733 Obstructive sleep apnea (adult) (pediatric): Secondary | ICD-10-CM | POA: Diagnosis present

## 2017-07-21 DIAGNOSIS — E1169 Type 2 diabetes mellitus with other specified complication: Secondary | ICD-10-CM | POA: Diagnosis present

## 2017-07-21 DIAGNOSIS — E1159 Type 2 diabetes mellitus with other circulatory complications: Secondary | ICD-10-CM | POA: Diagnosis present

## 2017-07-21 HISTORY — PX: LEFT HEART CATH AND CORONARY ANGIOGRAPHY: CATH118249

## 2017-07-21 LAB — GLUCOSE, CAPILLARY: Glucose-Capillary: 135 mg/dL — ABNORMAL HIGH (ref 65–99)

## 2017-07-21 SURGERY — LEFT HEART CATH AND CORONARY ANGIOGRAPHY
Anesthesia: LOCAL

## 2017-07-21 MED ORDER — ASPIRIN 81 MG PO CHEW: 81.0000 mg | CHEWABLE_TABLET | ORAL | Status: DC

## 2017-07-21 MED ORDER — SODIUM CHLORIDE 0.9% FLUSH: 3.0000 mL | Freq: Two times a day (BID) | INTRAVENOUS | Status: DC

## 2017-07-21 MED ORDER — MIDAZOLAM HCL 2 MG/2ML IJ SOLN
INTRAMUSCULAR | Status: AC
  Filled 2017-07-21: qty 2

## 2017-07-21 MED ORDER — LIDOCAINE HCL (PF) 1 % IJ SOLN
INTRAMUSCULAR | Status: DC | PRN
  Administered 2017-07-21: 2 mL

## 2017-07-21 MED ORDER — SODIUM CHLORIDE 0.9 % WEIGHT BASED INFUSION: 1.0000 mL/kg/h | INTRAVENOUS | Status: DC

## 2017-07-21 MED ORDER — SODIUM CHLORIDE 0.9 % WEIGHT BASED INFUSION
3.0000 mL/kg/h | INTRAVENOUS | Status: AC
  Administered 2017-07-21: 3 mL/kg/h via INTRAVENOUS

## 2017-07-21 MED ORDER — VERAPAMIL HCL 2.5 MG/ML IV SOLN
INTRAVENOUS | Status: DC | PRN
  Administered 2017-07-21: 14:00:00 via INTRA_ARTERIAL

## 2017-07-21 MED ORDER — HEPARIN SODIUM (PORCINE) 1000 UNIT/ML IJ SOLN
INTRAMUSCULAR | Status: AC
  Filled 2017-07-21: qty 1

## 2017-07-21 MED ORDER — VERAPAMIL HCL 2.5 MG/ML IV SOLN
INTRAVENOUS | Status: AC
  Filled 2017-07-21: qty 2

## 2017-07-21 MED ORDER — MIDAZOLAM HCL 2 MG/2ML IJ SOLN
INTRAMUSCULAR | Status: DC | PRN
  Administered 2017-07-21: 1 mg via INTRAVENOUS

## 2017-07-21 MED ORDER — HEPARIN SODIUM (PORCINE) 1000 UNIT/ML IJ SOLN
INTRAMUSCULAR | Status: DC | PRN
  Administered 2017-07-21: 5000 [IU] via INTRAVENOUS

## 2017-07-21 MED ORDER — SODIUM CHLORIDE 0.9 % IV SOLN: 250.0000 mL | INTRAVENOUS | Status: DC | PRN

## 2017-07-21 MED ORDER — SODIUM CHLORIDE 0.9% FLUSH: 3.0000 mL | INTRAVENOUS | Status: DC | PRN

## 2017-07-21 MED ORDER — HEPARIN (PORCINE) IN NACL 1000-0.9 UT/500ML-% IV SOLN
INTRAVENOUS | Status: AC
  Filled 2017-07-21: qty 1000

## 2017-07-21 MED ORDER — FENTANYL CITRATE (PF) 100 MCG/2ML IJ SOLN
INTRAMUSCULAR | Status: DC | PRN
  Administered 2017-07-21: 50 ug via INTRAVENOUS

## 2017-07-21 MED ORDER — LIDOCAINE HCL (PF) 1 % IJ SOLN
INTRAMUSCULAR | Status: AC
  Filled 2017-07-21: qty 30

## 2017-07-21 MED ORDER — FENTANYL CITRATE (PF) 100 MCG/2ML IJ SOLN
INTRAMUSCULAR | Status: AC
  Filled 2017-07-21: qty 2

## 2017-07-21 MED ORDER — HEPARIN (PORCINE) IN NACL 2-0.9 UNITS/ML
INTRAMUSCULAR | Status: AC | PRN
  Administered 2017-07-21 (×2): 500 mL via INTRA_ARTERIAL

## 2017-07-21 MED ORDER — IOHEXOL 350 MG/ML SOLN
INTRAVENOUS | Status: DC | PRN
  Administered 2017-07-21: 110 mL via INTRA_ARTERIAL

## 2017-07-21 SURGICAL SUPPLY — 13 items
CATH INFINITI 5 FR JL3.5 (CATHETERS) ×2 IMPLANT
CATH INFINITI JR4 5F (CATHETERS) ×2 IMPLANT
CATH LAUNCHER 5F EBU3.5 (CATHETERS) ×2 IMPLANT
COVER PRB 48X5XTLSCP FOLD TPE (BAG) ×1 IMPLANT
COVER PROBE 5X48 (BAG) ×1
DEVICE RAD COMP TR BAND LRG (VASCULAR PRODUCTS) ×2 IMPLANT
GLIDESHEATH SLEND A-KIT 6F 22G (SHEATH) ×2 IMPLANT
GUIDEWIRE INQWIRE 1.5J.035X260 (WIRE) ×1 IMPLANT
INQWIRE 1.5J .035X260CM (WIRE) ×2
KIT HEART LEFT (KITS) ×2 IMPLANT
PACK CARDIAC CATHETERIZATION (CUSTOM PROCEDURE TRAY) ×2 IMPLANT
TRANSDUCER W/STOPCOCK (MISCELLANEOUS) ×2 IMPLANT
TUBING CIL FLEX 10 FLL-RA (TUBING) ×2 IMPLANT

## 2017-07-21 NOTE — Telephone Encounter (Signed)
-----   Message from Charlie Pitter, [place] sent at 07/20/2017  1:01 PM EDT ----- Please let patient know precath CBC and TSH were OK. Dayna Dunn PA-C

## 2017-07-21 NOTE — Telephone Encounter (Signed)
Called patient with test results. No answer. Left message to call back.  

## 2017-07-21 NOTE — Discharge Instructions (Signed)

## 2017-07-21 NOTE — Telephone Encounter (Signed)
Spoke to patient appointment scheduled with Dr.Jordan 08/05/17 at 10:20 am to discuss CTO.Patient was given directions to [facility] office.

## 2017-07-22 ENCOUNTER — Encounter (HOSPITAL_COMMUNITY): Payer: Self-pay | Admitting: Interventional Cardiology

## 2017-07-22 MED FILL — Heparin Sod (Porcine)-NaCl IV Soln 1000 Unit/500ML-0.9%: INTRAVENOUS | Qty: 1000 | Status: AC

## 2017-07-22 NOTE — H&P (Signed)
Cath Lab Visit (complete for each Cath Lab visit)  Clinical Evaluation Leading to the Procedure:   ACS: No.  Non-ACS:    Anginal Classification: CCS III  Anti-ischemic medical therapy: Minimal Therapy (1 class of medications)  Non-Invasive Test Results: No non-invasive testing performed  Prior CABG: No previous CABG       

## 2017-07-23 ENCOUNTER — Ambulatory Visit: Payer: BLUE CROSS/BLUE SHIELD | Admitting: Cardiology

## 2017-08-03 NOTE — Progress Notes (Signed)
Cardiology Office Note    Date:  08/05/2017  ID:  Carl [name], DOB Jul 16, 1975, MRN [id number] PCP:  Dettinger, Fransisca Kaufmann, MD  Cardiologist:  Carlyle Dolly, MD  Chief Complaint: f/u post cardiac cath to consider PCI  History of Present Illness:  Carl [name] is a 42 y.o. male with history of CAD, HTN, HLD, DM who presents for consideration of CTO PCI.  He has hx of cath in 2015 [facility] showing occluded proximal LAD, LAD filled with left-left and right-left collaterals, otherwise 20-30% prox-mid RCA, LVEF 55%. He has been managed medically since that time. 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 2D echo same time showed EF 77-82%, normal diastolic function. This was managed medically At last OV 03/2017 his Toprol was titrated for chronic stable angina.   He was seen 07/16/17  reporting increasing angina. He is a Administrator. 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 More recently it has been approximately 6-10 times a month, occurring primarily with exertion such as lifting loads on his job or self-sexual activity. He also has to use a crack to get trailer attached to his truck and this causes pain.  He has not had any syncope, palpitations, vomiting. His BP has limited titration of medication.  He underwent repeat coronary angiography on 07/21/17 showing Occluded proximal to mid LAD with right to left collaterals. EF mildly impaired.   He continues to smoke 1.5-2 ppd.    Past Medical History:  Diagnosis Date  . CAD in native artery    a. cath in 2015 [facility] showing occluded proximal LAD, LAD filled with left-left and right-left collaterals, otherwise 20-30% prox-mid RCA, LVEF 55%. b. Abnormal nuc 02/2016 - mgmd medically.  . Diabetes mellitus type 2 in obese ([place])   . Hyperlipidemia   . Hypertension     Past Surgical History:  Procedure Laterality Date  . LEFT  HEART CATH AND CORONARY ANGIOGRAPHY N/A 07/21/2017   Procedure: LEFT HEART CATH AND CORONARY ANGIOGRAPHY;  Surgeon: Belva Crome, MD;  Location: [facility];  Service: Cardiovascular;  Laterality: N/A;  . SKIN BIOPSY    . TUMOR EXCISION     left knee area    Current Medications: Current Meds  Medication Sig  . albuterol (PROVENTIL HFA;VENTOLIN HFA) 108 (90 Base) MCG/ACT inhaler Inhale 2 puffs into the lungs every 6 (six) hours as needed for wheezing or shortness of breath.  . Ascorbic Acid (VITAMIN C) 1000 MG tablet Take 2,000 mg by mouth daily.  Marland Kitchen aspirin EC 81 MG tablet Take 81 mg by mouth.  Marland Kitchen atorvastatin (LIPITOR) 80 MG tablet TAKE ONE TABLET BY MOUTH ONCE DAILY  . cetirizine (ZYRTEC) 10 MG tablet Take 10 mg by mouth daily.  . isosorbide mononitrate (IMDUR) 60 MG 24 hr tablet Take 1 tablet (60 mg total) by mouth daily. Dose increased 04/24/2016  . Linagliptin-metFORMIN HCl ER 06-998 MG TB24 Take 1 tablet by mouth daily.  . metoprolol succinate (TOPROL-XL) 50 MG 24 hr tablet Take 1 tablet (50 mg total) by mouth daily. Take with or immediately following a meal.  . nitroGLYCERIN (NITROSTAT) 0.4 MG SL tablet Place 1 tablet (0.4 mg total) under the tongue every 5 (five) minutes as needed for chest pain.    Allergies:   Patient has no known allergies.   Social History   Socioeconomic History  . Marital status: Divorced  Spouse name: Not on file  . Number of children: Not on file  . Years of education: Not on file  . Highest education level: Not on file  Occupational History  . Not on file  Social Needs  . Financial resource strain: Not on file  . Food insecurity:    Worry: Not on file    Inability: Not on file  . Transportation needs:    Medical: Not on file    Non-medical: Not on file  Tobacco Use  . Smoking status: Current Every Day Smoker    Packs/day: 2.50    Types: Cigarettes    Start date: 01/03/1993  . Smokeless tobacco: Never Used  Substance and Sexual  Activity  . Alcohol use: Yes    Comment: occasionally  . Drug use: No  . Sexual activity: Not on file  Lifestyle  . Physical activity:    Days per week: Not on file    Minutes per session: Not on file  . Stress: Not on file  Relationships  . Social connections:    Talks on phone: Not on file    Gets together: Not on file    Attends religious service: Not on file    Active member of club or organization: Not on file    Attends meetings of clubs or organizations: Not on file    Relationship status: Not on file  Other Topics Concern  . Not on file  Social History Narrative  . Not on file     Family History:  The patient'sfamily history includes Diabetes in his father and mother.  ROS:   Please see the history of present illness. All other systems are reviewed and otherwise negative.    PHYSICAL EXAM:   VS:  BP 104/76   Pulse 84   Ht 5\' 11"  (1.803 m)   Wt 291 lb (132 kg)   SpO2 96%   BMI 40.59 kg/m   BMI: Body mass index is 40.59 kg/m. GENERAL:  Well appearing morbidly obese WM in NAD HEENT:  PERRL, EOMI, sclera are clear. Oropharynx is clear. NECK:  No jugular venous distention, carotid upstroke brisk and symmetric, no bruits, no thyromegaly or adenopathy LUNGS:  Clear to auscultation bilaterally CHEST:  Unremarkable HEART:  RRR,  PMI not displaced or sustained,S1 and S2 within normal limits, no S3, no S4: no clicks, no rubs, no murmurs ABD:  Soft, nontender. BS +, no masses or bruits. No hepatomegaly, no splenomegaly EXT:  2 + pulses throughout, no edema, no cyanosis no clubbing SKIN:  Warm and dry.  No rashes NEURO:  Alert and oriented x 3. Cranial nerves II through XII intact. PSYCH:  Cognitively intact '  Wt Readings from Last 3 Encounters:  08/05/17 291 lb (132 kg)  07/21/17 284 lb (128.8 kg)  07/16/17 283 lb 12.8 oz (128.7 kg)      Studies/Labs Reviewed:   EKG:  EKG was not ordered today   Recent Labs: 07/16/2017: ALT 20; BUN 11; Creatinine, Ser  1.02; Potassium 4.8; Sodium 140 07/20/2017: Hemoglobin 14.3; Platelets 183; TSH 2.368   Lipid Panel    Component Value Date/Time   CHOL 109 07/16/2017 1333   TRIG 105 07/16/2017 1333   HDL 29 (L) 07/16/2017 1333   CHOLHDL 3.8 07/16/2017 1333   LDLCALC 59 07/16/2017 1333    Additional studies/ records that were reviewed today include:  Procedures   LEFT HEART CATH AND CORONARY ANGIOGRAPHY  Conclusion    Chronic total occlusion of the  proximal to mid LAD after the first septal perforator.  Left to right and right to left collaterals are noted.  Widely patent left main  Normal circumflex coronary artery  Moderately diseased right coronary with eccentric 50 to 60% proximal to mid lesion 40% eccentric distal lesion and 40% ostial PDA stenosis.  Abnormal left ventricular function with anteroapical hypokinesis.  EF 45 to 50%.  Normal filling pressures.  RECOMMENDATIONS:   Aggressive risk factor modification: Discussed smoking cessation, better control of diabetes, diet, management of sleep apnea.  Consider for CTO LAD.  Will refer the patient to the CTO team.  Treatment considerations include CTO LAD if possible followed by FFR and possibly stenting of the RCA.  If LAD recanalization is not possible, coronary bypass grafting with LIMA to LAD and grafting of diagonal and right coronary will be an alternative consideration given impairment in LV function.      ASSESSMENT & PLAN:   1. CAD with progressive class 2 angina. On optimal medical therapy. Difficult to titrate due to low BP.  CTO of the LAD.  Med titration has been limited by his BP.   I personally reviewed his angiograms with him. He appears to be a suitable candidate for CTO PCI of the LAD. This is appropriate for symptom relief and myocardial preservation. Occlusion is at least 44 years old but is fairly short. Success rate is approximately 80%. Discussed procedure and risk at length with patient. The procedure and  risks were reviewed including but not limited to death, myocardial infarction, stroke, arrythmias, bleeding, transfusion, emergency surgery, dye allergy, or renal dysfunction. The patient voices understanding and is agreeable to proceed. Procedure scheduled for July 3.   2. HTN - well controlled.  3. Hyperlipidemia - LDL at goal on statin therapy.  4. Diabetes in obese - followed by primary care.  5.   Tobacco. Encouraged smoking cessation. He appears poorly motivated to quit.      Medication Adjustments/Labs and Tests Ordered: Current medicines are reviewed at length with the patient today.  Concerns regarding medicines are outlined above. Medication changes, Labs and Tests ordered today are summarized above and listed in the Patient Instructions accessible in Encounters.   Signed, Malikiah Debarr [place], MD  08/05/2017 11:17 AM    [facility] Location in [place]. [place], [place] [address] Ph: [phone]; Fax [phone]

## 2017-08-03 NOTE — H&P (View-Only) (Signed)
Cardiology Office Note    Date:  08/05/2017  ID:  NYGEL PROKOP, DOB 05-01-1975, MRN [id number] PCP:  Dettinger, Fransisca Kaufmann, MD  Cardiologist:  Carlyle Dolly, MD  Chief Complaint: f/u post cardiac cath to consider PCI  History of Present Illness:  Carl [name] is a 42 y.o. male with history of CAD, HTN, HLD, DM who presents for consideration of CTO PCI.  He has hx of cath in 2015 [facility] showing occluded proximal LAD, LAD filled with left-left and right-left collaterals, otherwise 20-30% prox-mid RCA, LVEF 55%. He has been managed medically since that time. 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 2D echo same time showed EF 35-00%, normal diastolic function. This was managed medically At last OV 03/2017 his Toprol was titrated for chronic stable angina.   He was seen 07/16/17  reporting increasing angina. He is a Administrator. 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 More recently it has been approximately 6-10 times a month, occurring primarily with exertion such as lifting loads on his job or self-sexual activity. He also has to use a crack to get trailer attached to his truck and this causes pain.  He has not had any syncope, palpitations, vomiting. His BP has limited titration of medication.  He underwent repeat coronary angiography on 07/21/17 showing Occluded proximal to mid LAD with right to left collaterals. EF mildly impaired.   He continues to smoke 1.5-2 ppd.    Past Medical History:  Diagnosis Date  . CAD in native artery    a. cath in 2015 [facility] showing occluded proximal LAD, LAD filled with left-left and right-left collaterals, otherwise 20-30% prox-mid RCA, LVEF 55%. b. Abnormal nuc 02/2016 - mgmd medically.  . Diabetes mellitus type 2 in obese ([place])   . Hyperlipidemia   . Hypertension     Past Surgical History:  Procedure Laterality Date  . LEFT  HEART CATH AND CORONARY ANGIOGRAPHY N/A 07/21/2017   Procedure: LEFT HEART CATH AND CORONARY ANGIOGRAPHY;  Surgeon: Belva Crome, MD;  Location: [facility];  Service: Cardiovascular;  Laterality: N/A;  . SKIN BIOPSY    . TUMOR EXCISION     left knee area    Current Medications: Current Meds  Medication Sig  . albuterol (PROVENTIL HFA;VENTOLIN HFA) 108 (90 Base) MCG/ACT inhaler Inhale 2 puffs into the lungs every 6 (six) hours as needed for wheezing or shortness of breath.  . Ascorbic Acid (VITAMIN C) 1000 MG tablet Take 2,000 mg by mouth daily.  Marland Kitchen aspirin EC 81 MG tablet Take 81 mg by mouth.  Marland Kitchen atorvastatin (LIPITOR) 80 MG tablet TAKE ONE TABLET BY MOUTH ONCE DAILY  . cetirizine (ZYRTEC) 10 MG tablet Take 10 mg by mouth daily.  . isosorbide mononitrate (IMDUR) 60 MG 24 hr tablet Take 1 tablet (60 mg total) by mouth daily. Dose increased 04/24/2016  . Linagliptin-metFORMIN HCl ER 06-998 MG TB24 Take 1 tablet by mouth daily.  . metoprolol succinate (TOPROL-XL) 50 MG 24 hr tablet Take 1 tablet (50 mg total) by mouth daily. Take with or immediately following a meal.  . nitroGLYCERIN (NITROSTAT) 0.4 MG SL tablet Place 1 tablet (0.4 mg total) under the tongue every 5 (five) minutes as needed for chest pain.    Allergies:   Patient has no known allergies.   Social History   Socioeconomic History  . Marital status: Divorced  Spouse name: Not on file  . Number of children: Not on file  . Years of education: Not on file  . Highest education level: Not on file  Occupational History  . Not on file  Social Needs  . Financial resource strain: Not on file  . Food insecurity:    Worry: Not on file    Inability: Not on file  . Transportation needs:    Medical: Not on file    Non-medical: Not on file  Tobacco Use  . Smoking status: Current Every Day Smoker    Packs/day: 2.50    Types: Cigarettes    Start date: 01/03/1993  . Smokeless tobacco: Never Used  Substance and Sexual  Activity  . Alcohol use: Yes    Comment: occasionally  . Drug use: No  . Sexual activity: Not on file  Lifestyle  . Physical activity:    Days per week: Not on file    Minutes per session: Not on file  . Stress: Not on file  Relationships  . Social connections:    Talks on phone: Not on file    Gets together: Not on file    Attends religious service: Not on file    Active member of club or organization: Not on file    Attends meetings of clubs or organizations: Not on file    Relationship status: Not on file  Other Topics Concern  . Not on file  Social History Narrative  . Not on file     Family History:  The patient'sfamily history includes Diabetes in his father and mother.  ROS:   Please see the history of present illness. All other systems are reviewed and otherwise negative.    PHYSICAL EXAM:   VS:  BP 104/76   Pulse 84   Ht 5\' 11"  (1.803 m)   Wt 291 lb (132 kg)   SpO2 96%   BMI 40.59 kg/m   BMI: Body mass index is 40.59 kg/m. GENERAL:  Well appearing morbidly obese WM in NAD HEENT:  PERRL, EOMI, sclera are clear. Oropharynx is clear. NECK:  No jugular venous distention, carotid upstroke brisk and symmetric, no bruits, no thyromegaly or adenopathy LUNGS:  Clear to auscultation bilaterally CHEST:  Unremarkable HEART:  RRR,  PMI not displaced or sustained,S1 and S2 within normal limits, no S3, no S4: no clicks, no rubs, no murmurs ABD:  Soft, nontender. BS +, no masses or bruits. No hepatomegaly, no splenomegaly EXT:  2 + pulses throughout, no edema, no cyanosis no clubbing SKIN:  Warm and dry.  No rashes NEURO:  Alert and oriented x 3. Cranial nerves II through XII intact. PSYCH:  Cognitively intact '  Wt Readings from Last 3 Encounters:  08/05/17 291 lb (132 kg)  07/21/17 284 lb (128.8 kg)  07/16/17 283 lb 12.8 oz (128.7 kg)      Studies/Labs Reviewed:   EKG:  EKG was not ordered today   Recent Labs: 07/16/2017: ALT 20; BUN 11; Creatinine, Ser  1.02; Potassium 4.8; Sodium 140 07/20/2017: Hemoglobin 14.3; Platelets 183; TSH 2.368   Lipid Panel    Component Value Date/Time   CHOL 109 07/16/2017 1333   TRIG 105 07/16/2017 1333   HDL 29 (L) 07/16/2017 1333   CHOLHDL 3.8 07/16/2017 1333   LDLCALC 59 07/16/2017 1333    Additional studies/ records that were reviewed today include:  Procedures   LEFT HEART CATH AND CORONARY ANGIOGRAPHY  Conclusion    Chronic total occlusion of the  proximal to mid LAD after the first septal perforator.  Left to right and right to left collaterals are noted.  Widely patent left main  Normal circumflex coronary artery  Moderately diseased right coronary with eccentric 50 to 60% proximal to mid lesion 40% eccentric distal lesion and 40% ostial PDA stenosis.  Abnormal left ventricular function with anteroapical hypokinesis.  EF 45 to 50%.  Normal filling pressures.  RECOMMENDATIONS:   Aggressive risk factor modification: Discussed smoking cessation, better control of diabetes, diet, management of sleep apnea.  Consider for CTO LAD.  Will refer the patient to the CTO team.  Treatment considerations include CTO LAD if possible followed by FFR and possibly stenting of the RCA.  If LAD recanalization is not possible, coronary bypass grafting with LIMA to LAD and grafting of diagonal and right coronary will be an alternative consideration given impairment in LV function.      ASSESSMENT & PLAN:   1. CAD with progressive class 2 angina. On optimal medical therapy. Difficult to titrate due to low BP.  CTO of the LAD.  Med titration has been limited by his BP.   I personally reviewed his angiograms with him. He appears to be a suitable candidate for CTO PCI of the LAD. This is appropriate for symptom relief and myocardial preservation. Occlusion is at least 44 years old but is fairly short. Success rate is approximately 80%. Discussed procedure and risk at length with patient. The procedure and  risks were reviewed including but not limited to death, myocardial infarction, stroke, arrythmias, bleeding, transfusion, emergency surgery, dye allergy, or renal dysfunction. The patient voices understanding and is agreeable to proceed. Procedure scheduled for July 3.   2. HTN - well controlled.  3. Hyperlipidemia - LDL at goal on statin therapy.  4. Diabetes in obese - followed by primary care.  5.   Tobacco. Encouraged smoking cessation. He appears poorly motivated to quit.      Medication Adjustments/Labs and Tests Ordered: Current medicines are reviewed at length with the patient today.  Concerns regarding medicines are outlined above. Medication changes, Labs and Tests ordered today are summarized above and listed in the Patient Instructions accessible in Encounters.   Signed, Peter Martinique, MD  08/05/2017 11:17 AM    [facility] Location in [place]. [place], [place] [address] Ph: [phone]; Fax [phone]

## 2017-08-04 ENCOUNTER — Ambulatory Visit: Payer: BLUE CROSS/BLUE SHIELD | Admitting: Cardiology

## 2017-08-05 ENCOUNTER — Other Ambulatory Visit: Payer: Self-pay | Admitting: Cardiology

## 2017-08-05 ENCOUNTER — Encounter: Payer: Self-pay | Admitting: Cardiology

## 2017-08-05 ENCOUNTER — Ambulatory Visit (INDEPENDENT_AMBULATORY_CARE_PROVIDER_SITE_OTHER): Payer: BLUE CROSS/BLUE SHIELD | Admitting: Cardiology

## 2017-08-05 ENCOUNTER — Other Ambulatory Visit: Payer: Self-pay

## 2017-08-05 VITALS — BP 104/76 | HR 84 | Ht 71.0 in | Wt 291.0 lb

## 2017-08-05 DIAGNOSIS — Z720 Tobacco use: Secondary | ICD-10-CM | POA: Diagnosis not present

## 2017-08-05 DIAGNOSIS — E785 Hyperlipidemia, unspecified: Secondary | ICD-10-CM | POA: Diagnosis not present

## 2017-08-05 DIAGNOSIS — I10 Essential (primary) hypertension: Secondary | ICD-10-CM | POA: Diagnosis not present

## 2017-08-05 DIAGNOSIS — I209 Angina pectoris, unspecified: Secondary | ICD-10-CM

## 2017-08-05 DIAGNOSIS — I25118 Atherosclerotic heart disease of native coronary artery with other forms of angina pectoris: Secondary | ICD-10-CM | POA: Diagnosis not present

## 2017-08-05 MED ORDER — ATORVASTATIN CALCIUM 80 MG PO TABS: 80.0000 mg | ORAL_TABLET | Freq: Every day | ORAL | 3 refills | Status: DC

## 2017-08-05 MED ORDER — METOPROLOL SUCCINATE ER 50 MG PO TB24: 50.0000 mg | ORAL_TABLET | Freq: Every day | ORAL | 3 refills | Status: DC

## 2017-08-05 MED ORDER — CLOPIDOGREL BISULFATE 75 MG PO TABS: 75.0000 mg | ORAL_TABLET | Freq: Every day | ORAL | 3 refills | Status: DC

## 2017-08-05 MED ORDER — ISOSORBIDE MONONITRATE ER 60 MG PO TB24: 60.0000 mg | ORAL_TABLET | Freq: Every day | ORAL | 3 refills | Status: DC

## 2017-08-05 NOTE — Patient Instructions (Addendum)
Continue your current therapy  Quit smoking  We will add Plavix 75 mg daily      [employer] [address] [place] [address] Dept: [phone] Loc: [phone]  Carl [name]  08/05/2017  You are scheduled for a Cardiac cath CTO Wednesday 08/25/17 on  with Dr.Anaiza Behrens.  1. Please arrive at the [facility] (Main Entrance A) at [facility]: [address] [place], [place] [address] at 6:30 am (two hours before your procedure to ensure your preparation). Free valet parking service is available.   Special note: Every effort is made to have your procedure done on time. Please understand that emergencies sometimes delay scheduled procedures.  2. Diet: Nothing to eat or drink after midnight Tuesday 08/24/17.  3. Labs: Wednesday 08/18/17 ( bmet,cbc )  4. Medication instructions in preparation for your procedure:        Hold Linagliptin/Metformin morning of procedure and 2 days after procedure.     On the morning of your procedure, take your Aspirin and Plavix and any morning medicines NOT listed above.  You may use sips of water.  5. Plan for one night stay--bring personal belongings. 6. Bring a current list of your medications and current insurance cards. 7. You MUST have a responsible person to drive you home. 8. Someone MUST be with you the first 24 hours after you arrive home or your discharge will be delayed. 9. Please wear clothes that are easy to get on and off and wear slip-on shoes.  Thank you for allowing [place] to care for you!   -- [facility]

## 2017-08-11 IMAGING — NM NM MYOCAR MULTI W/SPECT W/WALL MOTION & EF
2 series · 12 of 12 positions shown · non-contrast
Comparison: none

[Series 1: rest · 8.28mm/px · 6 of 64 frames shown]
[frame 6/64]
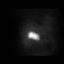
[frame 16/64]
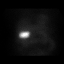
[frame 27/64]
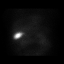
[frame 38/64]
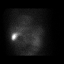
[frame 48/64]
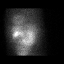
[frame 59/64]
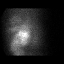

[Series 2: stress gated · 8.28mm/px · 6 of 64 frames shown]
[frame 6/64]
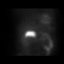
[frame 16/64]
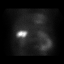
[frame 27/64]
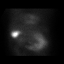
[frame 38/64]
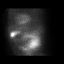
[frame 48/64]
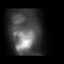
[frame 59/64]
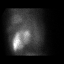

[12 of 12 positions shown; findings below may reference images not displayed]

Canned report from images found in remote index.

Refer to host system for actual result text.

## 2017-08-13 ENCOUNTER — Ambulatory Visit: Payer: BLUE CROSS/BLUE SHIELD | Admitting: Cardiology

## 2017-08-18 ENCOUNTER — Other Ambulatory Visit (HOSPITAL_COMMUNITY)
Admission: RE | Admit: 2017-08-18 | Discharge: 2017-08-18 | Disposition: A | Discharge status: Home / Self Care | Payer: BLUE CROSS/BLUE SHIELD | Source: Ambulatory Visit | Attending: Cardiology | Admitting: Cardiology

## 2017-08-18 DIAGNOSIS — E785 Hyperlipidemia, unspecified: Secondary | ICD-10-CM | POA: Diagnosis present

## 2017-08-18 DIAGNOSIS — I10 Essential (primary) hypertension: Secondary | ICD-10-CM | POA: Diagnosis present

## 2017-08-18 DIAGNOSIS — I25118 Atherosclerotic heart disease of native coronary artery with other forms of angina pectoris: Secondary | ICD-10-CM | POA: Insufficient documentation

## 2017-08-18 DIAGNOSIS — Z720 Tobacco use: Secondary | ICD-10-CM | POA: Diagnosis present

## 2017-08-18 LAB — BASIC METABOLIC PANEL
Anion gap: 9 (ref 5–15)
BUN: 16 mg/dL (ref 6–20)
CHLORIDE: 104 mmol/L (ref 98–111)
CO2: 25 mmol/L (ref 22–32)
Calcium: 8.9 mg/dL (ref 8.9–10.3)
Creatinine, Ser: 1.05 mg/dL (ref 0.61–1.24)
GFR calc Af Amer: >60 mL/min (ref >60)
GFR calc non Af Amer: >60 mL/min (ref >60)
Glucose, Bld: 126 mg/dL — ABNORMAL HIGH (ref 70–99)
Potassium: 4 mmol/L (ref 3.5–5.1)
SODIUM: 138 mmol/L (ref 135–145)

## 2017-08-18 LAB — CBC WITH DIFFERENTIAL/PLATELET
Basophils Absolute: 0 10*3/uL (ref 0.0–0.1)
Basophils Relative: 0 %
EOS PCT: 2 %
Eosinophils Absolute: 0.2 10*3/uL (ref 0.0–0.7)
HCT: 43.2 % (ref 39.0–52.0)
Hemoglobin: 14.8 g/dL (ref 13.0–17.0)
LYMPHS ABS: 2.7 10*3/uL (ref 0.7–4.0)
Lymphocytes Relative: 28 %
MCH: 32.4 pg (ref 26.0–34.0)
MCHC: 34.3 g/dL (ref 30.0–36.0)
MCV: 94.5 fL (ref 78.0–100.0)
MONOS PCT: 13 %
Monocytes Absolute: 1.3 10*3/uL — ABNORMAL HIGH (ref 0.1–1.0)
Neutro Abs: 5.4 10*3/uL (ref 1.7–7.7)
Neutrophils Relative %: 57 %
PLATELETS: 169 10*3/uL (ref 150–400)
RBC: 4.57 MIL/uL (ref 4.22–5.81)
RDW: 12.9 % (ref 11.5–15.5)
WBC: 9.6 10*3/uL (ref 4.0–10.5)

## 2017-08-19 ENCOUNTER — Telehealth: Payer: Self-pay | Admitting: Family Medicine

## 2017-08-19 MED ORDER — LINAGLIPTIN-METFORMIN HCL ER 5-1000 MG PO TB24: 1.0000 | ORAL_TABLET | Freq: Every day | ORAL | 0 refills | Status: DC

## 2017-08-19 NOTE — Telephone Encounter (Signed)
done

## 2017-08-23 ENCOUNTER — Telehealth: Payer: Self-pay | Admitting: *Deleted

## 2017-08-23 NOTE — Telephone Encounter (Addendum)
Pt contacted pre-catheterization scheduled at [facility] for: Wednesday August 25, 2017 8:30 AM Verified arrival time and place: [facility] Main Entrance A at:6:30 AM  No solid food after midnight prior to cath, clear liquids until 5 AM day of procedure. Verify allergies in Epic.  Hold: linagliptin-metformin day of procedure and 48 hours post procedure.  Except hold medications AM meds can be  taken pre-cath with sip of water including: ASA 81 mg Clopidogrel 75 mg  Confirm patient has responsible person to drive home post procedure and observe patient for 24 hours  Discussed instructions with patient, he verbalized understanding, thanked me for call.

## 2017-08-24 NOTE — Telephone Encounter (Signed)
LMTCB to discuss instructions with patient 

## 2017-08-25 ENCOUNTER — Other Ambulatory Visit: Payer: Self-pay

## 2017-08-25 ENCOUNTER — Encounter (HOSPITAL_COMMUNITY): Payer: Self-pay | Admitting: Cardiology

## 2017-08-25 ENCOUNTER — Encounter (HOSPITAL_COMMUNITY)
Admission: RE | Disposition: A | Discharge status: Home / Self Care | Payer: Self-pay | Source: Ambulatory Visit | Attending: Cardiology

## 2017-08-25 ENCOUNTER — Ambulatory Visit (HOSPITAL_COMMUNITY)
Admission: RE | Admit: 2017-08-25 | Discharge: 2017-08-26 | Disposition: A | Discharge status: Home / Self Care | Payer: BLUE CROSS/BLUE SHIELD | Source: Ambulatory Visit | Attending: Cardiology | Admitting: Cardiology

## 2017-08-25 DIAGNOSIS — Z833 Family history of diabetes mellitus: Secondary | ICD-10-CM | POA: Diagnosis not present

## 2017-08-25 DIAGNOSIS — Z9889 Other specified postprocedural states: Secondary | ICD-10-CM | POA: Insufficient documentation

## 2017-08-25 DIAGNOSIS — I10 Essential (primary) hypertension: Secondary | ICD-10-CM

## 2017-08-25 DIAGNOSIS — E1169 Type 2 diabetes mellitus with other specified complication: Secondary | ICD-10-CM | POA: Diagnosis present

## 2017-08-25 DIAGNOSIS — E118 Type 2 diabetes mellitus with unspecified complications: Secondary | ICD-10-CM | POA: Diagnosis present

## 2017-08-25 DIAGNOSIS — Z7982 Long term (current) use of aspirin: Secondary | ICD-10-CM | POA: Diagnosis not present

## 2017-08-25 DIAGNOSIS — I25119 Atherosclerotic heart disease of native coronary artery with unspecified angina pectoris: Secondary | ICD-10-CM | POA: Insufficient documentation

## 2017-08-25 DIAGNOSIS — I2582 Chronic total occlusion of coronary artery: Secondary | ICD-10-CM | POA: Diagnosis not present

## 2017-08-25 DIAGNOSIS — E1159 Type 2 diabetes mellitus with other circulatory complications: Secondary | ICD-10-CM | POA: Diagnosis not present

## 2017-08-25 DIAGNOSIS — I209 Angina pectoris, unspecified: Secondary | ICD-10-CM | POA: Diagnosis present

## 2017-08-25 DIAGNOSIS — G4733 Obstructive sleep apnea (adult) (pediatric): Secondary | ICD-10-CM | POA: Diagnosis not present

## 2017-08-25 DIAGNOSIS — Z7984 Long term (current) use of oral hypoglycemic drugs: Secondary | ICD-10-CM | POA: Insufficient documentation

## 2017-08-25 DIAGNOSIS — Z6841 Body Mass Index (BMI) 40.0 and over, adult: Secondary | ICD-10-CM | POA: Insufficient documentation

## 2017-08-25 DIAGNOSIS — I25720 Atherosclerosis of autologous artery coronary artery bypass graft(s) with unstable angina pectoris: Secondary | ICD-10-CM | POA: Diagnosis present

## 2017-08-25 DIAGNOSIS — F17210 Nicotine dependence, cigarettes, uncomplicated: Secondary | ICD-10-CM | POA: Diagnosis not present

## 2017-08-25 DIAGNOSIS — Z955 Presence of coronary angioplasty implant and graft: Secondary | ICD-10-CM | POA: Diagnosis not present

## 2017-08-25 DIAGNOSIS — E662 Morbid (severe) obesity with alveolar hypoventilation: Secondary | ICD-10-CM | POA: Diagnosis present

## 2017-08-25 DIAGNOSIS — I152 Hypertension secondary to endocrine disorders: Secondary | ICD-10-CM | POA: Insufficient documentation

## 2017-08-25 DIAGNOSIS — I2510 Atherosclerotic heart disease of native coronary artery without angina pectoris: Secondary | ICD-10-CM | POA: Diagnosis present

## 2017-08-25 DIAGNOSIS — Z79899 Other long term (current) drug therapy: Secondary | ICD-10-CM | POA: Insufficient documentation

## 2017-08-25 DIAGNOSIS — E785 Hyperlipidemia, unspecified: Secondary | ICD-10-CM | POA: Diagnosis not present

## 2017-08-25 DIAGNOSIS — Z7902 Long term (current) use of antithrombotics/antiplatelets: Secondary | ICD-10-CM | POA: Diagnosis not present

## 2017-08-25 HISTORY — PX: CORONARY CTO INTERVENTION: CATH118236

## 2017-08-25 HISTORY — DX: Major depressive disorder, single episode, unspecified: F32.9

## 2017-08-25 HISTORY — DX: Headache, unspecified: R51.9

## 2017-08-25 HISTORY — DX: Depression, unspecified: F32.A

## 2017-08-25 HISTORY — DX: Headache: R51

## 2017-08-25 HISTORY — DX: Anxiety disorder, unspecified: F41.9

## 2017-08-25 HISTORY — DX: Dependence on other enabling machines and devices: Z99.89

## 2017-08-25 HISTORY — DX: Obstructive sleep apnea (adult) (pediatric): G47.33

## 2017-08-25 LAB — POCT ACTIVATED CLOTTING TIME
ACTIVATED CLOTTING TIME: 263 s
ACTIVATED CLOTTING TIME: 296 s
ACTIVATED CLOTTING TIME: 257 s
ACTIVATED CLOTTING TIME: 395 s
Activated Clotting Time: 197 seconds
Activated Clotting Time: 153 seconds

## 2017-08-25 LAB — GLUCOSE, CAPILLARY
GLUCOSE-CAPILLARY: 123 mg/dL — AB (ref 70–99)
GLUCOSE-CAPILLARY: 196 mg/dL — AB (ref 70–99)
Glucose-Capillary: 115 mg/dL — ABNORMAL HIGH (ref 70–99)
Glucose-Capillary: 149 mg/dL — ABNORMAL HIGH (ref 70–99)

## 2017-08-25 SURGERY — CORONARY CTO INTERVENTION
Anesthesia: LOCAL

## 2017-08-25 MED ORDER — SODIUM CHLORIDE 0.9 % IV SOLN
INTRAVENOUS | Status: AC
  Administered 2017-08-25: 11:00:00 via INTRAVENOUS

## 2017-08-25 MED ORDER — HEPARIN (PORCINE) IN NACL 1000-0.9 UT/500ML-% IV SOLN
INTRAVENOUS | Status: AC
  Filled 2017-08-25: qty 2000

## 2017-08-25 MED ORDER — LABETALOL HCL 5 MG/ML IV SOLN: 10.0000 mg | INTRAVENOUS | Status: AC | PRN

## 2017-08-25 MED ORDER — SODIUM CHLORIDE 0.9% FLUSH: 3.0000 mL | INTRAVENOUS | Status: DC | PRN

## 2017-08-25 MED ORDER — CLOPIDOGREL BISULFATE 75 MG PO TABS: 75.0000 mg | ORAL_TABLET | Freq: Every day | ORAL | Status: DC

## 2017-08-25 MED ORDER — IOPAMIDOL (ISOVUE-370) INJECTION 76%
INTRAVENOUS | Status: AC
  Filled 2017-08-25: qty 100

## 2017-08-25 MED ORDER — HEPARIN (PORCINE) IN NACL 2-0.9 UNITS/ML
INTRAMUSCULAR | Status: AC | PRN
  Administered 2017-08-25 (×4): 500 mL

## 2017-08-25 MED ORDER — LINAGLIPTIN 5 MG PO TABS
5.0000 mg | ORAL_TABLET | Freq: Every day | ORAL | Status: DC
  Administered 2017-08-26: 09:00:00 5 mg via ORAL
  Filled 2017-08-25 (×2): qty 1

## 2017-08-25 MED ORDER — NITROGLYCERIN 0.4 MG SL SUBL
0.4000 mg | SUBLINGUAL_TABLET | SUBLINGUAL | Status: DC | PRN
Start: 2017-08-25 — End: 2017-08-26

## 2017-08-25 MED ORDER — NITROGLYCERIN 1 MG/10 ML FOR IR/CATH LAB
INTRA_ARTERIAL | Status: AC
  Filled 2017-08-25: qty 10

## 2017-08-25 MED ORDER — SODIUM CHLORIDE 0.9 % IV SOLN
INTRAVENOUS | Status: DC
  Administered 2017-08-25: 07:00:00 via INTRAVENOUS

## 2017-08-25 MED ORDER — SODIUM CHLORIDE 0.9% FLUSH
3.0000 mL | Freq: Two times a day (BID) | INTRAVENOUS | Status: DC
  Administered 2017-08-25: 22:00:00 3 mL via INTRAVENOUS

## 2017-08-25 MED ORDER — MIDAZOLAM HCL 2 MG/2ML IJ SOLN
INTRAMUSCULAR | Status: AC
  Filled 2017-08-25: qty 2

## 2017-08-25 MED ORDER — IOPAMIDOL (ISOVUE-370) INJECTION 76%
INTRAVENOUS | Status: DC | PRN
  Administered 2017-08-25: 195 mL via INTRA_ARTERIAL

## 2017-08-25 MED ORDER — SODIUM CHLORIDE 0.9 % IV SOLN: 250.0000 mL | INTRAVENOUS | Status: DC | PRN

## 2017-08-25 MED ORDER — LIDOCAINE HCL (PF) 1 % IJ SOLN
INTRAMUSCULAR | Status: AC
  Filled 2017-08-25: qty 60

## 2017-08-25 MED ORDER — MIDAZOLAM HCL 2 MG/2ML IJ SOLN
INTRAMUSCULAR | Status: DC | PRN
  Administered 2017-08-25 (×6): 1 mg via INTRAVENOUS
  Administered 2017-08-25: 2 mg via INTRAVENOUS

## 2017-08-25 MED ORDER — ACETAMINOPHEN 325 MG PO TABS: 650.0000 mg | ORAL_TABLET | ORAL | Status: DC | PRN

## 2017-08-25 MED ORDER — ASPIRIN EC 81 MG PO TBEC: 81.0000 mg | DELAYED_RELEASE_TABLET | Freq: Every day | ORAL | Status: DC

## 2017-08-25 MED ORDER — HEPARIN SODIUM (PORCINE) 1000 UNIT/ML IJ SOLN
INTRAMUSCULAR | Status: AC
  Filled 2017-08-25: qty 1

## 2017-08-25 MED ORDER — FENTANYL CITRATE (PF) 100 MCG/2ML IJ SOLN
INTRAMUSCULAR | Status: DC | PRN
  Administered 2017-08-25 (×3): 25 ug via INTRAVENOUS
  Administered 2017-08-25: 50 ug via INTRAVENOUS
  Administered 2017-08-25 (×3): 25 ug via INTRAVENOUS

## 2017-08-25 MED ORDER — METOPROLOL SUCCINATE ER 50 MG PO TB24
50.0000 mg | ORAL_TABLET | Freq: Every day | ORAL | Status: DC
  Administered 2017-08-26: 09:00:00 50 mg via ORAL
  Filled 2017-08-25: qty 1

## 2017-08-25 MED ORDER — ASPIRIN 81 MG PO CHEW
81.0000 mg | CHEWABLE_TABLET | Freq: Every day | ORAL | Status: DC
  Administered 2017-08-26: 81 mg via ORAL
  Filled 2017-08-25: qty 1

## 2017-08-25 MED ORDER — ONDANSETRON HCL 4 MG/2ML IJ SOLN: 4.0000 mg | Freq: Four times a day (QID) | INTRAMUSCULAR | Status: DC | PRN

## 2017-08-25 MED ORDER — ATORVASTATIN CALCIUM 80 MG PO TABS
80.0000 mg | ORAL_TABLET | Freq: Every day | ORAL | Status: DC
  Administered 2017-08-26: 80 mg via ORAL
  Filled 2017-08-25: qty 1

## 2017-08-25 MED ORDER — IOPAMIDOL (ISOVUE-370) INJECTION 76%
INTRAVENOUS | Status: AC
  Filled 2017-08-25: qty 125

## 2017-08-25 MED ORDER — LIDOCAINE HCL (PF) 1 % IJ SOLN
INTRAMUSCULAR | Status: DC | PRN
  Administered 2017-08-25: 16 mL via INTRADERMAL
  Administered 2017-08-25: 14 mL via INTRADERMAL

## 2017-08-25 MED ORDER — ASPIRIN 81 MG PO CHEW: 81.0000 mg | CHEWABLE_TABLET | ORAL | Status: DC

## 2017-08-25 MED ORDER — FENTANYL CITRATE (PF) 100 MCG/2ML IJ SOLN
INTRAMUSCULAR | Status: AC
  Filled 2017-08-25: qty 2

## 2017-08-25 MED ORDER — SODIUM CHLORIDE 0.9% FLUSH: 3.0000 mL | Freq: Two times a day (BID) | INTRAVENOUS | Status: DC

## 2017-08-25 MED ORDER — CLOPIDOGREL BISULFATE 75 MG PO TABS
75.0000 mg | ORAL_TABLET | Freq: Every day | ORAL | Status: DC
  Administered 2017-08-26: 75 mg via ORAL
  Filled 2017-08-25: qty 1

## 2017-08-25 MED ORDER — NITROGLYCERIN 1 MG/10 ML FOR IR/CATH LAB
INTRA_ARTERIAL | Status: DC | PRN
  Administered 2017-08-25 (×3): 200 ug via INTRACORONARY

## 2017-08-25 MED ORDER — HEPARIN SODIUM (PORCINE) 1000 UNIT/ML IJ SOLN
INTRAMUSCULAR | Status: DC | PRN
  Administered 2017-08-25: 3000 [IU] via INTRAVENOUS
  Administered 2017-08-25: 11000 [IU] via INTRAVENOUS
  Administered 2017-08-25: 4000 [IU] via INTRAVENOUS

## 2017-08-25 MED ORDER — ANGIOPLASTY BOOK
Freq: Once | Status: AC
  Administered 2017-08-25: 1
  Filled 2017-08-25: qty 1

## 2017-08-25 MED ORDER — SODIUM CHLORIDE 0.9 % IV SOLN
INTRAVENOUS | Status: AC | PRN
  Administered 2017-08-25: 250 mL via INTRAVENOUS

## 2017-08-25 MED ORDER — HYDRALAZINE HCL 20 MG/ML IJ SOLN: 5.0000 mg | INTRAMUSCULAR | Status: AC | PRN

## 2017-08-25 SURGICAL SUPPLY — 25 items
BALLN SAPPHIRE 2.0X20 (BALLOONS) ×2
BALLN ~~LOC~~ EMERGE MR 3.0X20 (BALLOONS) ×2
BALLOON SAPPHIRE 2.0X20 (BALLOONS) ×1 IMPLANT
BALLOON ~~LOC~~ EMERGE MR 3.0X20 (BALLOONS) ×1 IMPLANT
CATH INFINITI 5FR AL1 (CATHETERS) ×2 IMPLANT
CATH MACH1 8FR CLS3.5 (CATHETERS) ×2 IMPLANT
CATH TRAPPER 6-8F (CATHETERS) ×2 IMPLANT
CATH TURNPIKE 135CM (CATHETERS) ×2 IMPLANT
COVER PRB 48X5XTLSCP FOLD TPE (BAG) ×1 IMPLANT
COVER PROBE 5X48 (BAG) ×1
ELECT DEFIB PAD ADLT CADENCE (PAD) ×2 IMPLANT
KIT ENCORE 26 ADVANTAGE (KITS) ×2 IMPLANT
KIT HEART LEFT (KITS) ×2 IMPLANT
KIT HEMO VALVE WATCHDOG (MISCELLANEOUS) ×2 IMPLANT
PACK CARDIAC CATHETERIZATION (CUSTOM PROCEDURE TRAY) ×2 IMPLANT
SHEATH BRITE TIP 8FR 35CM (SHEATH) ×2 IMPLANT
SHEATH PINNACLE 5F 10CM (SHEATH) ×2 IMPLANT
STENT SYNERGY DES 2.5X16 (Permanent Stent) ×2 IMPLANT
STENT SYNERGY DES 2.5X38 (Permanent Stent) ×2 IMPLANT
TRANSDUCER W/STOPCOCK (MISCELLANEOUS) ×2 IMPLANT
TUBING CIL FLEX 10 FLL-RA (TUBING) ×2 IMPLANT
WIRE ASAHI FIELDER FC 180CM (WIRE) ×2 IMPLANT
WIRE ASAHI PROWATER 180CM (WIRE) ×2 IMPLANT
WIRE FIGHTER CROSSING 190CM (WIRE) ×4 IMPLANT
WIRE HI TORQ PILOT-200 300CM (WIRE) ×2 IMPLANT

## 2017-08-25 NOTE — Interval H&P Note (Signed)
History and Physical Interval Note:  08/25/2017 7:56 AM  Carl [name]  has presented today for surgery, with the diagnosis of cad  The various methods of treatment have been discussed with the patient and family. After consideration of risks, benefits and other options for treatment, the patient has consented to  Procedure(s): CORONARY CTO INTERVENTION (N/A) as a surgical intervention .  The patient's history has been reviewed, patient examined, no change in status, stable for surgery.  I have reviewed the patient's chart and labs.  Questions were answered to the patient's satisfaction.   Cath Lab Visit (complete for each Cath Lab visit)  Clinical Evaluation Leading to the Procedure:   ACS: No.  Non-ACS:    Anginal Classification: CCS II  Anti-ischemic medical therapy: Maximal Therapy (2 or more classes of medications)  Non-Invasive Test Results: High-risk stress test findings: cardiac mortality >3%/year  Prior CABG: No previous CABG        Carl [name] 08/25/2017 7:56 AM

## 2017-08-25 NOTE — Progress Notes (Signed)
Site area: RFA  LFA Site Prior to Removal:  Level 0/0 Pressure Applied For:20 min each side Manual:   yes Patient Status During Pull:  stable Post Pull Site:  Level 0/0 Post Pull Instructions Given:  yes Post Pull Pulses Present: palpable  Dressing Applied:  clear Bedrest begins @ 1520 Comments:

## 2017-08-25 NOTE — Progress Notes (Signed)
[id number] Brief ed done. Pt stated he would like sheath out so he can gave coffee. Also stated he wants to go outside and smoke. Discussed with pt that his RN could call and get nicotine patch but he stated that would not help. Pt did not want to discuss not smoking so I left handout. Reviewed importance of plavix with stent. Reviewed NTG use, ex ed and left diabetic and heart healthy diets. 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 Graylon Good RN BSN 08/25/2017 1:57 PM

## 2017-08-26 DIAGNOSIS — E1169 Type 2 diabetes mellitus with other specified complication: Secondary | ICD-10-CM | POA: Diagnosis not present

## 2017-08-26 DIAGNOSIS — E118 Type 2 diabetes mellitus with unspecified complications: Secondary | ICD-10-CM | POA: Diagnosis not present

## 2017-08-26 DIAGNOSIS — I152 Hypertension secondary to endocrine disorders: Secondary | ICD-10-CM | POA: Diagnosis not present

## 2017-08-26 DIAGNOSIS — I25119 Atherosclerotic heart disease of native coronary artery with unspecified angina pectoris: Secondary | ICD-10-CM | POA: Diagnosis not present

## 2017-08-26 DIAGNOSIS — E1159 Type 2 diabetes mellitus with other circulatory complications: Secondary | ICD-10-CM | POA: Diagnosis not present

## 2017-08-26 DIAGNOSIS — I10 Essential (primary) hypertension: Secondary | ICD-10-CM

## 2017-08-26 DIAGNOSIS — E785 Hyperlipidemia, unspecified: Secondary | ICD-10-CM

## 2017-08-26 LAB — BASIC METABOLIC PANEL
Anion gap: 4 — ABNORMAL LOW (ref 5–15)
BUN: 13 mg/dL (ref 6–20)
CALCIUM: 8.5 mg/dL — AB (ref 8.9–10.3)
CO2: 27 mmol/L (ref 22–32)
CREATININE: 0.97 mg/dL (ref 0.61–1.24)
Chloride: 106 mmol/L (ref 98–111)
GFR calc Af Amer: >60 mL/min (ref >60)
GFR calc non Af Amer: >60 mL/min (ref >60)
GLUCOSE: 105 mg/dL — AB (ref 70–99)
Potassium: 4.2 mmol/L (ref 3.5–5.1)
Sodium: 137 mmol/L (ref 135–145)

## 2017-08-26 LAB — CBC
HCT: 42.3 % (ref 39.0–52.0)
Hemoglobin: 14.3 g/dL (ref 13.0–17.0)
MCH: 31.8 pg (ref 26.0–34.0)
MCHC: 33.8 g/dL (ref 30.0–36.0)
MCV: 94 fL (ref 78.0–100.0)
PLATELETS: 158 10*3/uL (ref 150–400)
RBC: 4.5 MIL/uL (ref 4.22–5.81)
RDW: 12.2 % (ref 11.5–15.5)
WBC: 10.1 10*3/uL (ref 4.0–10.5)

## 2017-08-26 LAB — GLUCOSE, CAPILLARY: GLUCOSE-CAPILLARY: 110 mg/dL — AB (ref 70–99)

## 2017-08-26 NOTE — Care Management Note (Signed)
Case Management Note  Patient Details  Name: Carl [name] MRN: [id number] Date of Birth: 1975-10-31  Subjective/Objective:                    Action/Plan: Pt discharging home with self care. PCP: Dr Dettinger Insurance: BC[employer] Pt has transportation home.   Expected Discharge Date:  08/26/17               Expected Discharge Plan:  Home/Self Care  In-House Referral:     Discharge planning Services     Post Acute Care Choice:    Choice offered to:     DME Arranged:    DME Agency:     HH Arranged:    HH Agency:     Status of Service:  Completed, signed off  If discussed at [employer] of Stay Meetings, dates discussed:    Additional Comments:  Pollie Friar, RN 08/26/2017, 9:20 AM

## 2017-08-26 NOTE — Discharge Summary (Signed)
Discharge Summary    Patient ID: Carl [name],  MRN: [id number], DOB/AGE: [birth year] 42 y.o.  Admit date: 08/25/2017 Discharge date: 08/26/2017  Primary Care Provider: Dettinger, Vonna Kotyk A Primary Cardiologist: Dr. Harl Bowie  Discharge Diagnoses    Principal Problem:   Angina pectoris [facility]) Active Problems:   Hyperlipidemia associated with type 2 diabetes mellitus ([place])   Hypertension associated with diabetes ([place])   Controlled diabetes mellitus type 2 with complications ([place])   OSA (obstructive sleep apnea)   Morbid obesity ([place])   Obesity hypoventilation syndrome ([place])   Coronary artery disease   Allergies No Known Allergies  Diagnostic Studies/Procedures    Cath: 08/25/17  Conclusion     Prox LAD lesion is 100% stenosed.  A drug-eluting stent was successfully placed using a STENT SYNERGY DES 2.5X38.  A drug-eluting stent was successfully placed using a STENT SYNERGY DES 2.5X16.  Post intervention, there is a 0% residual stenosis.  Ost 1st Diag to 1st Diag lesion is 100% stenosed.   1. Successful CTO PCI of the LAD with DES x 2.  Recommend uninterrupted dual antiplatelet therapy with Aspirin 81mg  daily and Clopidogrel 75mg  daily for a minimum of 12 months (ACS - Class I recommendation).  Anticipate DC in am.  _____________   History of Present Illness     42 y.o. male with history of CAD, HTN, HLD, DM who presented for consideration of CTO PCI.  He has hx of cath in 2015 [facility] showing occluded proximal LAD, LAD filled with left-left and right-left collaterals, otherwise 20-30% prox-mid RCA, LVEF 55%. He has been managed medically since that time. 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 2D echo same time showed EF 12-45%, normal diastolic function. This was managed medically At last OV 03/2017 his Toprol was titrated for chronic stable angina.   He was seen 07/16/17 reporting  increasing angina. He is a Administrator. 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 More recently it has been approximately 6-10 times a month, occurring primarily with exertion such as lifting loads on his job or self-sexual activity. He also has to use a crack to get trailer attached to his truck and this causes pain.  He has not had any syncope, palpitations, vomiting. His BP has limited titration of medication.  He underwent repeat coronary angiography on 07/21/17 showing Occluded proximal to mid LAD with right to left collaterals. EF mildly impaired.   He continued to smoke 1.5-2 ppd. It had been difficult to titrate his medical therapy due to low blood pressure.  He was referred to Dr. Martinique for consideration of CTO.  It was felt at this last appointment that he would be a suitable candidate.  He was set up for outpatient cardiac cath.  Hospital Course     Underwent successful CTO PCI of the LAD with DES x2.  His diagonal remains occluded but with collaterals.  No complications noted post cath.  He will be continued on DAPT with aspirin and Plavix for at least one year.  We will plan to stop his him door as he has had headaches secondary to this.  Continued on metoprolol.  He ambulated in the hallway without any difficulty.  Phase 2 cardiac rehab will be ordered at the time of discharge.  He was also counseled on smoking cessation.  Carl [name] was seen by Dr. Martinique and determined stable for discharge  home. Follow up in the office has been arranged. Medications are listed below.   _____________  Discharge Vitals Blood pressure (!) 151/75, pulse 72, temperature 98 F (36.7 C), temperature source Oral, resp. rate (!) 24, height 5\' 11"  (1.803 m), weight 288 lb 11.2 oz (131 kg), SpO2 95 %.  Filed Weights   08/25/17 0645 08/26/17 0434  Weight: 290 lb (131.5 kg) 288 lb 11.2 oz (131 kg)    Labs & Radiologic Studies    CBC Recent Labs     08/26/17 0337  WBC 10.1  HGB 14.3  HCT 42.3  MCV 94.0  PLT 301   Basic Metabolic Panel Recent Labs    08/26/17 0337  NA 137  K 4.2  CL 106  CO2 27  GLUCOSE 105*  BUN 13  CREATININE 0.97  CALCIUM 8.5*   Liver Function Tests No results for input(s): AST, ALT, ALKPHOS, BILITOT, PROT, ALBUMIN in the last 72 hours. No results for input(s): LIPASE, AMYLASE in the last 72 hours. Cardiac Enzymes No results for input(s): CKTOTAL, CKMB, CKMBINDEX, TROPONINI in the last 72 hours. BNP Invalid input(s): POCBNP D-Dimer No results for input(s): DDIMER in the last 72 hours. Hemoglobin A1C No results for input(s): HGBA1C in the last 72 hours. Fasting Lipid Panel No results for input(s): CHOL, HDL, LDLCALC, TRIG, CHOLHDL, LDLDIRECT in the last 72 hours. Thyroid Function Tests No results for input(s): TSH, T4TOTAL, T3FREE, THYROIDAB in the last 72 hours.  Invalid input(s): FREET3 _____________  No results found. Disposition   Pt is being discharged home today in good condition.  Follow-up Plans & Appointments    Follow-up Information    Arnoldo Lenis, MD Follow up on 09/02/2017.   Specialty:  Cardiology Why:  at 2pm for your follow up appt.  Contact information: [address] [place] [address] [phone]        Martinique, Peter M, MD Follow up on 09/09/2017.   Specialty:  Cardiology Why:  at 10:20am for your follow up appt.  Contact information: [place] [address] [phone]          Discharge Instructions    AMB Referral to Cardiac Rehabilitation - Phase II   Complete by:  As directed    Diagnosis:  Coronary Stents   Amb Referral to Cardiac Rehabilitation   Complete by:  As directed    Diagnosis:  Coronary Stents   Amb Referral to Cardiac Rehabilitation   Complete by:  As directed    Diagnosis:  Coronary Stents   Call MD for:  redness, tenderness, or signs of infection (pain, swelling, redness, odor or green/yellow  discharge around incision site)   Complete by:  As directed    Diet - low sodium heart healthy   Complete by:  As directed    Discharge instructions   Complete by:  As directed    Groin Site Care Refer to this sheet in the next few weeks. These instructions provide you with information on caring for yourself after your procedure. Your caregiver may also give you more specific instructions. Your treatment has been planned according to current medical practices, but problems sometimes occur. Call your caregiver if you have any problems or questions after your procedure. HOME CARE INSTRUCTIONS You may shower 24 hours after the procedure. Remove the bandage (dressing) and gently wash the site with plain soap and water. Gently pat the site dry.  Do not apply powder or lotion to the site.  Do  not sit in a bathtub, swimming pool, or whirlpool for 5 to 7 days.  No bending, squatting, or lifting anything over 10 pounds (4.5 kg) as directed by your caregiver.  Inspect the site at least twice daily.  Do not drive home if you are discharged the same day of the procedure. Have someone else drive you.  You may drive 24 hours after the procedure unless otherwise instructed by your caregiver.  What to expect: Any bruising will usually fade within 1 to 2 weeks.  Blood that collects in the tissue (hematoma) may be painful to the touch. It should usually decrease in size and tenderness within 1 to 2 weeks.  SEEK IMMEDIATE MEDICAL CARE IF: You have unusual pain at the groin site or down the affected leg.  You have redness, warmth, swelling, or pain at the groin site.  You have drainage (other than a small amount of blood on the dressing).  You have chills.  You have a fever or persistent symptoms for more than 72 hours.  You have a fever and your symptoms suddenly get worse.  Your leg becomes pale, cool, tingly, or numb.  You have heavy bleeding from the site. Hold pressure on the site. Marland Kitchen  PLEASE DO NOT  MISS ANY DOSES OF YOUR PLAVIX!!!!! Also keep a log of you blood pressures and bring back to your follow up appt. Please call the office with any questions.   Patients taking blood thinners should generally stay away from medicines like ibuprofen, Advil, Motrin, naproxen, and Aleve due to risk of stomach bleeding. You may take Tylenol as directed or talk to your primary doctor about alternatives.   Increase activity slowly   Complete by:  As directed        Discharge Medications     Medication List    STOP taking these medications   isosorbide mononitrate 60 MG 24 hr tablet Commonly known as:  IMDUR     TAKE these medications   albuterol 108 (90 Base) MCG/ACT inhaler Commonly known as:  PROVENTIL HFA;VENTOLIN HFA Inhale 2 puffs into the lungs every 6 (six) hours as needed for wheezing or shortness of breath. What changed:  how much to take   aspirin EC 81 MG tablet Take 81 mg by mouth daily.   atorvastatin 80 MG tablet Commonly known as:  LIPITOR Take 1 tablet (80 mg total) by mouth daily.   clopidogrel 75 MG tablet Commonly known as:  PLAVIX Take 1 tablet (75 mg total) by mouth daily.   linaGLIPtin-metFORMIN HCl ER 06-998 MG Tb24 Take 1 tablet by mouth daily.   metoprolol succinate 50 MG 24 hr tablet Commonly known as:  TOPROL-XL Take 1 tablet (50 mg total) by mouth daily. Take with or immediately following a meal.   nitroGLYCERIN 0.4 MG SL tablet Commonly known as:  NITROSTAT Place 1 tablet (0.4 mg total) under the tongue every 5 (five) minutes as needed for chest pain.   vitamin C 1000 MG tablet Take 2,000 mg by mouth daily.        Acute coronary syndrome (MI, NSTEMI, STEMI, etc) this admission?: No.     Outstanding Labs/Studies   N/a  Duration of Discharge Encounter   Greater than 30 minutes including physician time.  Signed, Reino Bellis NP-C 08/26/2017, 8:44 AM

## 2017-08-26 NOTE — Progress Notes (Signed)
Progress Note  Patient Name: Carl [name] Date of Encounter: 08/26/2017  Primary Cardiologist: Carlyle Dolly, MD   Subjective   Feels well this am. No chest pain or dyspnea. Just didn't sleep well.   Inpatient Medications    Scheduled Meds: . aspirin  81 mg Oral Daily  . atorvastatin  80 mg Oral Daily  . clopidogrel  75 mg Oral Daily  . linagliptin  5 mg Oral Daily  . metoprolol succinate  50 mg Oral Daily  . sodium chloride flush  3 mL Intravenous Q12H   Continuous Infusions: . sodium chloride     PRN Meds: sodium chloride, acetaminophen, nitroGLYCERIN, ondansetron (ZOFRAN) IV, sodium chloride flush   Vital Signs    Vitals:   08/25/17 1930 08/25/17 2050 08/26/17 0434 08/26/17 0700  BP:  (!) 185/93 (!) 153/90 (!) 151/75  Pulse: 93 91 68 72  Resp: (!) 23 (!) 21 19 (!) 24  Temp:  98.4 F (36.9 C) 98.1 F (36.7 C) 98 F (36.7 C)  TempSrc:  Oral Oral Oral  SpO2: 97% 97% 95% 95%  Weight:   288 lb 11.2 oz (131 kg)   Height:        Intake/Output Summary (Last 24 hours) at 08/26/2017 0728 Last data filed at 08/25/2017 2217 Gross per 24 hour  Intake 240 ml  Output 400 ml  Net -160 ml   Filed Weights   08/25/17 0645 08/26/17 0434  Weight: 290 lb (131.5 kg) 288 lb 11.2 oz (131 kg)    Telemetry    NSR - Personally Reviewed  ECG    NSR with normal Ecg.  - Personally Reviewed  Physical Exam   GEN: obese WM in NAD.   Neck: No JVD Cardiac: RRR, no murmurs, rubs, or gallops.  Respiratory: Clear to auscultation bilaterally. GI: Soft, nontender, non-distended  Ext: right groin without hematoma. Left groin with modest bruise but no hematoma. Pulses 2+. MS: No edema; No deformity. Neuro:  Nonfocal  Psych: Normal affect   Labs    Chemistry Recent Labs  Lab 08/26/17 0337  NA 137  K 4.2  CL 106  CO2 27  GLUCOSE 105*  BUN 13  CREATININE 0.97  CALCIUM 8.5*  GFRNONAA >60  GFRAA >60  ANIONGAP 4*     Hematology Recent Labs  Lab 08/26/17 0337    WBC 10.1  RBC 4.50  HGB 14.3  HCT 42.3  MCV 94.0  MCH 31.8  MCHC 33.8  RDW 12.2  PLT 158    Cardiac EnzymesNo results for input(s): TROPONINI in the last 168 hours. No results for input(s): TROPIPOC in the last 168 hours.   BNPNo results for input(s): BNP, PROBNP in the last 168 hours.   DDimer No results for input(s): DDIMER in the last 168 hours.   Radiology    No results found.  Cardiac Studies   Procedures   CORONARY CTO INTERVENTION  Conclusion     Prox LAD lesion is 100% stenosed.  A drug-eluting stent was successfully placed using a STENT SYNERGY DES 2.5X38.  A drug-eluting stent was successfully placed using a STENT SYNERGY DES 2.5X16.  Post intervention, there is a 0% residual stenosis.  Ost 1st Diag to 1st Diag lesion is 100% stenosed.   1. Successful CTO PCI of the LAD with DES x 2.  Recommend uninterrupted dual antiplatelet therapy with Aspirin 81mg  daily and Clopidogrel 75mg  daily for a minimum of 12 months (ACS - Class I recommendation).  Anticipate DC in  am.      Patient Profile     42 y.o. male with history of HTN, HLD, DM and obesity. Seen for increasing angina. Has CTO of LAD.   Assessment & Plan    1. CAD. S/p CTO PCI of the LAD with DES x 2. Diagonal remains occluded with collaterals. No complications. Will continue DAPT for 1 year with ASA and Plavix. Will stop Imdur due to bad HAs. Continue Toprol. He is stable for DC today. 2. DM. Hold metformin for 48 hours then resume 3. HTN. BP elevated this am but over past 6 months has been in excellent control. Will give morning meds and monitor. 4. HLD on high dose statin. 5. Tobacco abuse. Counsel smoking cessation.  For questions or updates, please contact [place] Please consult [email or web address] for contact info under Cardiology/STEMI.      Signed, Peter Martinique, MD  08/26/2017, 7:28 AM

## 2017-09-02 ENCOUNTER — Ambulatory Visit (INDEPENDENT_AMBULATORY_CARE_PROVIDER_SITE_OTHER): Payer: BLUE CROSS/BLUE SHIELD | Admitting: Cardiology

## 2017-09-02 ENCOUNTER — Encounter: Payer: Self-pay | Admitting: Cardiology

## 2017-09-02 VITALS — BP 150/98 | HR 83 | Ht 71.0 in | Wt 288.0 lb

## 2017-09-02 DIAGNOSIS — I10 Essential (primary) hypertension: Secondary | ICD-10-CM

## 2017-09-02 DIAGNOSIS — I2510 Atherosclerotic heart disease of native coronary artery without angina pectoris: Secondary | ICD-10-CM

## 2017-09-02 DIAGNOSIS — E782 Mixed hyperlipidemia: Secondary | ICD-10-CM

## 2017-09-02 NOTE — Progress Notes (Signed)
Clinical Summary Carl [name] is a 42 y.o.male seen today for follow up of the following medical problem.s   1. CAD - abnormal exericse stress in 2015. - cath 2015 at [facility] with occluded proximal LAD, LAD filled with left-left and right-left collaterals. Medically managed - echo Jan 2018 LVEF 55-60%   - nuclear stress Jan 2018 showed apical and distal anterolateral infarct with ischemia. Overall intermediate to high risk.    06/2017 cath with prox LAD 100%, RCA prox 50%, RPDA 40%, norma LCX - he was referred for evaluation for CTO intervention on his LAD - 08/2017 succesful CTO PCI of LAD with DES x 2. Imdur stopped that admission due to headaches.   - no recent chest pain - compliant with meds.   2. Hyperlipidemia - he remains compliant with statin.   - 06/2017 TC 109 TG 105 HDL 29 LDL 59  3. HTN  - compliant with meds.    Past Medical History:  Diagnosis Date  . Anxiety   . CAD in native artery    a. cath in 2015 [facility] showing occluded proximal LAD, LAD filled with left-left and right-left collaterals, otherwise 20-30% prox-mid RCA, LVEF 55%. b. Abnormal nuc 02/2016 - [facility] medically.  . Daily headache    "since I started Plavix" (08/25/2017)  . Depression   . Diabetes mellitus type 2 in obese ([place])   . Hyperlipidemia   . Hypertension   . OSA on CPAP      No Known Allergies   Current Outpatient Medications  Medication Sig Dispense Refill  . albuterol (PROVENTIL HFA;VENTOLIN HFA) 108 (90 Base) MCG/ACT inhaler Inhale 2 puffs into the lungs every 6 (six) hours as needed for wheezing or shortness of breath. (Patient taking differently: Inhale 1-2 puffs into the lungs every 6 (six) hours as needed for wheezing or shortness of breath. ) 1 Inhaler 0  . Ascorbic Acid (VITAMIN C) 1000 MG tablet Take 2,000 mg by mouth daily.    Marland Kitchen aspirin EC 81 MG tablet Take 81 mg by mouth daily.     Marland Kitchen atorvastatin (LIPITOR) 80 MG tablet Take 1 tablet (80 mg total) by mouth daily.  90 tablet 3  . clopidogrel (PLAVIX) 75 MG tablet Take 1 tablet (75 mg total) by mouth daily. 90 tablet 3  . linaGLIPtin-metFORMIN HCl ER 06-998 MG TB24 Take 1 tablet by mouth daily. 90 tablet 0  . metoprolol succinate (TOPROL-XL) 50 MG 24 hr tablet Take 1 tablet (50 mg total) by mouth daily. Take with or immediately following a meal. 90 tablet 3  . nitroGLYCERIN (NITROSTAT) 0.4 MG SL tablet Place 1 tablet (0.4 mg total) under the tongue every 5 (five) minutes as needed for chest pain. 25 tablet 3   No current facility-administered medications for this visit.      Past Surgical History:  Procedure Laterality Date  . CORONARY CTO INTERVENTION N/A 08/25/2017   Procedure: CORONARY CTO INTERVENTION;  Surgeon: Martinique, Peter M, MD;  Location: [facility];  Service: Cardiovascular;  Laterality: N/A;  . LEFT HEART CATH AND CORONARY ANGIOGRAPHY N/A 07/21/2017   Procedure: LEFT HEART CATH AND CORONARY ANGIOGRAPHY;  Surgeon: Belva Crome, MD;  Location: [facility];  Service: Cardiovascular;  Laterality: N/A;  . MOLE REMOVAL Right    "chin"  . TUMOR EXCISION Left 1991   knee area     No Known Allergies    Family History  Problem Relation Age of Onset  . Diabetes  Mother   . Diabetes Father      Social History Carl [name] reports that he has been smoking cigarettes.  He started smoking about 24 years ago. He has a 48.00 pack-year smoking history. He has never used smokeless tobacco. Carl [name] reports that he drinks alcohol.   Review of Systems CONSTITUTIONAL: No weight loss, fever, chills, weakness or fatigue.  HEENT: Eyes: No visual loss, blurred vision, double vision or yellow sclerae.No hearing loss, sneezing, congestion, runny nose or sore throat.  SKIN: No rash or itching.  CARDIOVASCULAR: per hpi RESPIRATORY: No shortness of breath, cough or sputum.  GASTROINTESTINAL: No anorexia, nausea, vomiting or diarrhea. No abdominal pain or blood.  GENITOURINARY: No burning on  urination, no polyuria NEUROLOGICAL: No headache, dizziness, syncope, paralysis, ataxia, numbness or tingling in the extremities. No change in bowel or bladder control.  MUSCULOSKELETAL: No muscle, back pain, joint pain or stiffness.  LYMPHATICS: No enlarged nodes. No history of splenectomy.  PSYCHIATRIC: No history of depression or anxiety.  ENDOCRINOLOGIC: No reports of sweating, cold or heat intolerance. No polyuria or polydipsia.  Marland Kitchen   Physical Examination Vitals:   09/02/17 1353  BP: (!) 150/98  Pulse: 83  SpO2: 98%   Vitals:   09/02/17 1353  Weight: 288 lb (130.6 kg)  Height: 5\' 11"  (1.803 m)    Gen: resting comfortably, no acute distress HEENT: no scleral icterus, pupils equal round and reactive, no palptable cervical adenopathy,  CV: RRR, no m/r/g, no jvd Resp: Clear to auscultation bilaterally GI: abdomen is soft, non-tender, non-distended, normal bowel sounds, no hepatosplenomegaly MSK: extremities are warm, no edema.  Skin: warm, no rash Neuro:  no focal deficits Psych: appropriate affect   Diagnostic Studies 01/2014 cath [facility] FINDINGS: 1. Left Main: Widely patent. 2. LAD: The LAD is occluded in the proximal region.The mid and distal LAD is supplied by collateral flow from the circumflex and from the right coronary artery 3. Circumflex: Widely patent. There is a small first obtuse marginal artery and a large second obtuse marginal artery. The distal circumflex trifurcates. 4. Right Coronary Artery: There is 20-30% stenosis in the proximal and mid RCA. The RCA is dominant. The PDA and PLV branches are widely patent. 5. Hemodynamics: Aortic pressure is 141/70 mmHg LV pressure is 135/10 mmHg.   LEFT VENTRICULOGRAPHY: The ejection fraction is about 55%. Wall motion is normal.  CONCLUSIONS: 1. Occluded LAD with left to left and right to left collaterals flow. 2. Normal LV function. 3. He will be discharged home on aspirin, metoprolol, Imdur, Plavix, and a  statin. He will followup with Dr. Hamilton Capri in 2-4 weeks    Jan 2018 Nuclear stress  Blood pressure demonstrated a normal response to exercise. Nonlimiting exercise induced chest pain.  There was no ST segment deviation noted during stress. Duke treadmill score is 3, moderate risk  Findings consistent with prior apical and distal anterolateralmyocardial infarction with significant peri-infarct ischemia.  The left ventricular ejection fraction is mildly decreased (45-54%).  Overall moderate to high risk findings based on Duke treadmill score, mildly decreased LVEF, and ischemia by imaging.    Jan 2018 echo Study Conclusions  - Left ventricle: The cavity size was normal. Wall thickness was increased in a pattern of mild LVH. Systolic function was normal. The estimated ejection fraction was in the range of 55% to 60%. Wall motion was normal; there were no regional wall motion abnormalities. Left ventricular diastolic function parameters were normal. - Aortic valve: Valve area (VTI): 3.41 cm^2.  Valve area (Vmax): 2.78 cm^2. Valve area (Vmean): 2.87 cm^2. - Technically adequate study.  06/2017 cath  Left Anterior Descending  Prox LAD lesion 100% stenosed  Prox LAD lesion is 100% stenosed.  Second Septal Carl [name]  Collaterals  2nd Sept filled by collaterals from Inf Sept.    Right Coronary Artery  Prox RCA lesion 50% stenosed  Prox RCA lesion is 50% stenosed.  Dist RCA lesion 40% stenosed  Dist RCA lesion is 40% stenosed.  Right Posterior Descending Artery  Ost RPDA lesion 40% stenosed  Ost RPDA lesion is 40% stenosed.     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  Left to right and right to left collaterals are noted.  Widely patent left main  Normal circumflex coronary artery  Moderately diseased right coronary with eccentric 50 to 60% proximal to mid lesion 40% eccentric distal lesion and 40% ostial PDA  stenosis.  Abnormal left ventricular function with anteroapical hypokinesis.  EF 45 to 50%.  Normal filling pressures.  RECOMMENDATIONS:   Aggressive risk factor modification: Discussed smoking cessation, better control of diabetes, diet, management of sleep apnea.  Consider for CTO LAD.  Will refer the patient to the CTO team.  Treatment considerations include CTO LAD if possible followed by FFR and possibly stenting of the RCA.  If LAD recanalization is not possible, coronary bypass grafting with LIMA to LAD and grafting of diagonal and right coronary will be an alternative consideration given impairment in LV function.  08/2017 CTO PCI  Prox LAD lesion is 100% stenosed.  A drug-eluting stent was successfully placed using a STENT SYNERGY DES 2.5X38.  A drug-eluting stent was successfully placed using a STENT SYNERGY DES 2.5X16.  Post intervention, there is a 0% residual stenosis.  Ost 1st Diag to 1st Diag lesion is 100% stenosed.   1. Successful CTO PCI of the LAD with DES x 2.  Post cath Recommend uninterrupted dual antiplatelet therapy with Aspirin 81mg  daily and Clopidogrel 75mg  daily for a minimum of 12 months (ACS - Class I recommendation).    Assessment and Plan  1. CAD with chronic stable angina.  -doing well after recent intervetion on LAD CTO, continue current meds  2. Hyperlipidema - conitnue statin  3. HTN - manual check 130/80, continue current meds      Arnoldo Lenis, M.D.,

## 2017-09-02 NOTE — Patient Instructions (Signed)
Your physician recommends that you schedule a follow-up appointment in: [place] physician recommends that you continue on your current medications as directed. Please refer to the Current Medication list given to you today.  Thank you for choosing [facility]!!

## 2017-09-06 NOTE — Progress Notes (Signed)
Cardiology Office Note    Date:  09/09/2017  ID:  ZAILYN ROWSER, DOB 1976-02-23, MRN [id number] PCP:  Dettinger, Fransisca Kaufmann, MD  Cardiologist:  Carlyle Dolly, MD  Chief Complaint: f/u post PCI  History of Present Illness:  DICK HARK is a 42 y.o. male with history of CAD, HTN, HLD, DM who presents for follow up s/p CTO PCI.  He has hx of cath in 2015 [facility] showing occluded proximal LAD, LAD filled with left-left and right-left collaterals, otherwise 20-30% prox-mid RCA, LVEF 55%. He has been managed medically since that time. 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 2D echo same time showed EF 40-98%, normal diastolic function. This was managed medically At last OV 03/2017 his Toprol was titrated for chronic stable angina.   He was seen 07/16/17  reporting increasing angina. He is a Administrator. 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 More recently it has been approximately 6-10 times a month, occurring primarily with exertion such as lifting loads on his job or self-sexual activity. He also has to use a crank to get trailer attached to his truck and this causes pain.  He has not had any syncope, palpitations, vomiting. His BP has limited titration of medication.  He underwent repeat coronary angiography on 07/21/17 showing Occluded proximal to mid LAD with right to left collaterals. EF mildly impaired. He subsequently underwent successful CTO PCI of the LAD on 08/25/17 with DES x 2.   He continues to smoke 1.5-2 ppd.   On follow up today he has no complications from his procedure. Groins are ok. No angina but has not pushed activity. Plans to return to work on July 25. He is compliant with medication.    Past Medical History:  Diagnosis Date  . Anxiety   . CAD in native artery    a. cath in 2015 [facility] showing occluded proximal LAD, LAD filled with left-left and  right-left collaterals, otherwise 20-30% prox-mid RCA, LVEF 55%. b. Abnormal nuc 02/2016 - mgmd medically.  . Daily headache    "since I started Plavix" (08/25/2017)  . Depression   . Diabetes mellitus type 2 in obese ([place])   . Hyperlipidemia   . Hypertension   . OSA on CPAP     Past Surgical History:  Procedure Laterality Date  . CORONARY CTO INTERVENTION N/A 08/25/2017   Procedure: CORONARY CTO INTERVENTION;  Surgeon: Martinique, Ocia Simek M, MD;  Location: [facility];  Service: Cardiovascular;  Laterality: N/A;  . LEFT HEART CATH AND CORONARY ANGIOGRAPHY N/A 07/21/2017   Procedure: LEFT HEART CATH AND CORONARY ANGIOGRAPHY;  Surgeon: Belva Crome, MD;  Location: [facility];  Service: Cardiovascular;  Laterality: N/A;  . MOLE REMOVAL Right    "chin"  . TUMOR EXCISION Left 1991   knee area    Current Medications: Current Meds  Medication Sig  . albuterol (PROVENTIL HFA;VENTOLIN HFA) 108 (90 Base) MCG/ACT inhaler Inhale 2 puffs into the lungs every 6 (six) hours as needed for wheezing or shortness of breath. (Patient taking differently: Inhale 1-2 puffs into the lungs every 6 (six) hours as needed for wheezing or shortness of breath. )  . Ascorbic Acid (VITAMIN C) 1000 MG tablet Take 2,000 mg by mouth daily.  Marland Kitchen aspirin EC 81 MG tablet Take 81 mg by mouth daily.   Marland Kitchen atorvastatin (LIPITOR) 80 MG tablet Take 1 tablet (  80 mg total) by mouth daily.  . clopidogrel (PLAVIX) 75 MG tablet Take 1 tablet (75 mg total) by mouth daily.  Marland Kitchen linaGLIPtin-metFORMIN HCl ER 06-998 MG TB24 Take 1 tablet by mouth daily.  . metoprolol succinate (TOPROL-XL) 50 MG 24 hr tablet Take 1 tablet (50 mg total) by mouth daily. Take with or immediately following a meal.  . nitroGLYCERIN (NITROSTAT) 0.4 MG SL tablet Place 1 tablet (0.4 mg total) under the tongue every 5 (five) minutes as needed for chest pain.    Allergies:   Patient has no known allergies.   Social History   Socioeconomic History  .  Marital status: Divorced    Spouse name: Not on file  . Number of children: Not on file  . Years of education: Not on file  . Highest education level: Not on file  Occupational History  . Not on file  Social Needs  . Financial resource strain: Not on file  . Food insecurity:    Worry: Not on file    Inability: Not on file  . Transportation needs:    Medical: Not on file    Non-medical: Not on file  Tobacco Use  . Smoking status: Current Every Day Smoker    Packs/day: 2.00    Years: 24.00    Pack years: 48.00    Types: Cigarettes    Start date: 01/03/1993  . Smokeless tobacco: Never Used  Substance and Sexual Activity  . Alcohol use: Yes    Comment: 08/25/2017 "0-a few drinks/year"  . Drug use: Never  . Sexual activity: Yes  Lifestyle  . Physical activity:    Days per week: Not on file    Minutes per session: Not on file  . Stress: Not on file  Relationships  . Social connections:    Talks on phone: Not on file    Gets together: Not on file    Attends religious service: Not on file    Active member of club or organization: Not on file    Attends meetings of clubs or organizations: Not on file    Relationship status: Not on file  Other Topics Concern  . Not on file  Social History Narrative  . Not on file     Family History:  The patient'sfamily history includes Diabetes in his father and mother.  ROS:   Please see the history of present illness. All other systems are reviewed and otherwise negative.    PHYSICAL EXAM:   VS:  BP 106/64   Pulse 86   Ht 5\' 11"  (1.803 m)   Wt 288 lb (130.6 kg)   SpO2 96%   BMI 40.17 kg/m   BMI: Body mass index is 40.17 kg/m. GENERAL:  Well appearing, obese WM in NAD HEENT:  Normal NECK:  No jugular venous distention, carotid upstroke brisk and symmetric, no bruits, no thyromegaly or adenopathy LUNGS:  Clear to auscultation bilaterally CHEST:  Unremarkable HEART:  RRR,  PMI not displaced or sustained,S1 and S2 within normal  limits, no S3, no S4: no clicks, no rubs, no murmurs EXT:  2 + pulses throughout, no edema, no cyanosis no clubbing SKIN:  Warm and dry.  No rashes NEURO:  Alert and oriented x 3. Cranial nerves II through XII intact. PSYCH:  Cognitively intact   '  Wt Readings from Last 3 Encounters:  09/09/17 288 lb (130.6 kg)  09/02/17 288 lb (130.6 kg)  08/26/17 288 lb 11.2 oz (131 kg)  Studies/Labs Reviewed:   EKG:  EKG was not ordered today   Recent Labs: 07/16/2017: ALT 20 07/20/2017: TSH 2.368 08/26/2017: BUN 13; Creatinine, Ser 0.97; Hemoglobin 14.3; Platelets 158; Potassium 4.2; Sodium 137   Lipid Panel    Component Value Date/Time   CHOL 109 07/16/2017 1333   TRIG 105 07/16/2017 1333   HDL 29 (L) 07/16/2017 1333   CHOLHDL 3.8 07/16/2017 1333   LDLCALC 59 07/16/2017 1333    Additional studies/ records that were reviewed today include:  Procedures   LEFT HEART CATH AND CORONARY ANGIOGRAPHY  Conclusion    Chronic total occlusion of the proximal to mid LAD after the first septal perforator.  Left to right and right to left collaterals are noted.  Widely patent left main  Normal circumflex coronary artery  Moderately diseased right coronary with eccentric 50 to 60% proximal to mid lesion 40% eccentric distal lesion and 40% ostial PDA stenosis.  Abnormal left ventricular function with anteroapical hypokinesis.  EF 45 to 50%.  Normal filling pressures.  RECOMMENDATIONS:   Aggressive risk factor modification: Discussed smoking cessation, better control of diabetes, diet, management of sleep apnea.  Consider for CTO LAD.  Will refer the patient to the CTO team.  Treatment considerations include CTO LAD if possible followed by FFR and possibly stenting of the RCA.  If LAD recanalization is not possible, coronary bypass grafting with LIMA to LAD and grafting of diagonal and right coronary will be an alternative consideration given impairment in LV function.       ASSESSMENT & PLAN:   1. CAD. Now S/p CTO PCI of the LAD with DES x 2. On DAPT for at least one year. On optimal medical therapy. No current angina. I would expect symptoms to improve with activity. OK to return to work July 25. Focus on risk factor modification and lifestyle changes.   2. HTN - elevated today but has been well controlled.  3. Hyperlipidemia - LDL at goal on statin therapy.  4. Diabetes in obese - followed by primary care.  5.   Tobacco. Encouraged smoking cessation. He states he is NOT going to quit smoking.    Patient will follow up with Dr. Harl Bowie as scheduled   Medication Adjustments/Labs and Tests Ordered: Current medicines are reviewed at length with the patient today.  Concerns regarding medicines are outlined above. Medication changes, Labs and Tests ordered today are summarized above and listed in the Patient Instructions accessible in Encounters.   Signed, Korbin Notaro Martinique, MD  09/09/2017 10:29 AM

## 2017-09-09 ENCOUNTER — Encounter: Payer: Self-pay | Admitting: Cardiology

## 2017-09-09 ENCOUNTER — Ambulatory Visit (INDEPENDENT_AMBULATORY_CARE_PROVIDER_SITE_OTHER): Payer: BLUE CROSS/BLUE SHIELD | Admitting: Cardiology

## 2017-09-09 VITALS — BP 106/64 | HR 86 | Ht 71.0 in | Wt 288.0 lb

## 2017-09-09 DIAGNOSIS — Z720 Tobacco use: Secondary | ICD-10-CM | POA: Diagnosis not present

## 2017-09-09 DIAGNOSIS — I25118 Atherosclerotic heart disease of native coronary artery with other forms of angina pectoris: Secondary | ICD-10-CM | POA: Diagnosis not present

## 2017-09-09 DIAGNOSIS — E785 Hyperlipidemia, unspecified: Secondary | ICD-10-CM | POA: Diagnosis not present

## 2017-09-09 DIAGNOSIS — I10 Essential (primary) hypertension: Secondary | ICD-10-CM | POA: Diagnosis not present

## 2017-09-09 DIAGNOSIS — I209 Angina pectoris, unspecified: Secondary | ICD-10-CM

## 2017-09-09 NOTE — Patient Instructions (Signed)
Follow up with Dr. Harl Bowie as directed

## 2017-09-19 ENCOUNTER — Encounter: Payer: Self-pay | Admitting: Cardiology

## 2017-10-22 ENCOUNTER — Ambulatory Visit: Payer: BLUE CROSS/BLUE SHIELD | Admitting: Family Medicine

## 2017-10-26 ENCOUNTER — Encounter: Payer: Self-pay | Admitting: Family Medicine

## 2017-11-19 ENCOUNTER — Other Ambulatory Visit: Payer: Self-pay | Admitting: Family Medicine

## 2017-11-19 NOTE — Telephone Encounter (Signed)
Will do a month's refill for patient but he missed his last appointment, patient needs to be seen ASAP

## 2017-11-19 NOTE — Telephone Encounter (Signed)
Last seen 07/16/17

## 2017-11-26 ENCOUNTER — Ambulatory Visit: Payer: BLUE CROSS/BLUE SHIELD | Admitting: Family Medicine

## 2017-11-30 ENCOUNTER — Encounter: Payer: Self-pay | Admitting: Family Medicine

## 2017-12-10 ENCOUNTER — Ambulatory Visit: Payer: BLUE CROSS/BLUE SHIELD | Admitting: Family Medicine

## 2017-12-17 ENCOUNTER — Encounter: Payer: Self-pay | Admitting: Cardiology

## 2017-12-17 ENCOUNTER — Ambulatory Visit: Payer: BLUE CROSS/BLUE SHIELD | Admitting: Cardiology

## 2017-12-17 VITALS — BP 140/88 | HR 87 | Ht 71.0 in | Wt 294.8 lb

## 2017-12-17 DIAGNOSIS — E782 Mixed hyperlipidemia: Secondary | ICD-10-CM | POA: Diagnosis not present

## 2017-12-17 DIAGNOSIS — I2510 Atherosclerotic heart disease of native coronary artery without angina pectoris: Secondary | ICD-10-CM

## 2017-12-17 DIAGNOSIS — I10 Essential (primary) hypertension: Secondary | ICD-10-CM | POA: Diagnosis not present

## 2017-12-17 MED ORDER — LISINOPRIL 5 MG PO TABS: 5.0000 mg | ORAL_TABLET | Freq: Every day | ORAL | 1 refills | Status: DC

## 2017-12-17 NOTE — Progress Notes (Signed)
Clinical Summary Carl [name] is a 42 y.o.male seen today for follow up of the following medical problem.s   1. CAD - abnormal exericse stress in 2015. - cath 2015 at [facility] with occluded proximal LAD, LAD filled with left-left and right-left collaterals. Medically managed - echo Jan 2018 LVEF 55-60%   - nuclear stress Jan 2018 showed apical and distal anterolateral infarct with ischemia. Overall intermediate to high risk.   06/2017 cath with prox LAD 100%, RCA prox 50%, RPDA 40%, norma LCX - he was referred for evaluation for CTO intervention on his LAD - 08/2017 succesful CTO PCI of LAD with DES x 2. Imdur stopped that admission due to headaches.     - no recent chest pain. No SOB or DOE - compliant with meds. Does treadmill x 10-15 minutes brisk walk without troubles. - lisionpril previously stopped to allow room to tirate antianginals prior to his most recent cath.   2. Hyperlipidemia - 06/2017 TC 109 TG 105 HDL 29 LDL 59 - compliant with statin  3. HTN - compliant with meds   Past Medical History:  Diagnosis Date  . Anxiety   . CAD in native artery    a. cath in 2015 [facility] showing occluded proximal LAD, LAD filled with left-left and right-left collaterals, otherwise 20-30% prox-mid RCA, LVEF 55%. b. Abnormal nuc 02/2016 - mgmd medically.  . Daily headache    "since I started Plavix" (08/25/2017)  . Depression   . Diabetes mellitus type 2 in obese ([place])   . Hyperlipidemia   . Hypertension   . OSA on CPAP      No Known Allergies   Current Outpatient Medications  Medication Sig Dispense Refill  . albuterol (PROVENTIL HFA;VENTOLIN HFA) 108 (90 Base) MCG/ACT inhaler Inhale 2 puffs into the lungs every 6 (six) hours as needed for wheezing or shortness of breath. (Patient taking differently: Inhale 1-2 puffs into the lungs every 6 (six) hours as needed for wheezing or shortness of breath. ) 1 Inhaler 0  . Ascorbic Acid (VITAMIN C) 1000 MG tablet Take 2,000  mg by mouth daily.    Marland Kitchen aspirin EC 81 MG tablet Take 81 mg by mouth daily.     Marland Kitchen atorvastatin (LIPITOR) 80 MG tablet Take 1 tablet (80 mg total) by mouth daily. 90 tablet 3  . clopidogrel (PLAVIX) 75 MG tablet Take 1 tablet (75 mg total) by mouth daily. 90 tablet 3  . JENTADUETO XR 06-998 MG TB24 TAKE 1 TABLET BY MOUTH ONCE DAILY 90 tablet 0  . metoprolol succinate (TOPROL-XL) 50 MG 24 hr tablet Take 1 tablet (50 mg total) by mouth daily. Take with or immediately following a meal. 90 tablet 3  . nitroGLYCERIN (NITROSTAT) 0.4 MG SL tablet Place 1 tablet (0.4 mg total) under the tongue every 5 (five) minutes as needed for chest pain. 25 tablet 3   No current facility-administered medications for this visit.      Past Surgical History:  Procedure Laterality Date  . CORONARY CTO INTERVENTION N/A 08/25/2017   Procedure: CORONARY CTO INTERVENTION;  Surgeon: Martinique, Peter M, MD;  Location: [facility];  Service: Cardiovascular;  Laterality: N/A;  . LEFT HEART CATH AND CORONARY ANGIOGRAPHY N/A 07/21/2017   Procedure: LEFT HEART CATH AND CORONARY ANGIOGRAPHY;  Surgeon: Belva Crome, MD;  Location: [facility];  Service: Cardiovascular;  Laterality: N/A;  . MOLE REMOVAL Right    "chin"  . TUMOR EXCISION Left 1991  knee area     No Known Allergies    Family History  Problem Relation Age of Onset  . Diabetes Mother   . Diabetes Father      Social History Carl [name] reports that he has been smoking cigarettes. He started smoking about 24 years ago. He has a 48.00 pack-year smoking history. He has never used smokeless tobacco. Carl [name] reports that he drinks alcohol.   Review of Systems CONSTITUTIONAL: No weight loss, fever, chills, weakness or fatigue.  HEENT: Eyes: No visual loss, blurred vision, double vision or yellow sclerae.No hearing loss, sneezing, congestion, runny nose or sore throat.  SKIN: No rash or itching.  CARDIOVASCULAR: per hpi RESPIRATORY: No shortness  of breath, cough or sputum.  GASTROINTESTINAL: No anorexia, nausea, vomiting or diarrhea. No abdominal pain or blood.  GENITOURINARY: No burning on urination, no polyuria NEUROLOGICAL: No headache, dizziness, syncope, paralysis, ataxia, numbness or tingling in the extremities. No change in bowel or bladder control.  MUSCULOSKELETAL: No muscle, back pain, joint pain or stiffness.  LYMPHATICS: No enlarged nodes. No history of splenectomy.  PSYCHIATRIC: No history of depression or anxiety.  ENDOCRINOLOGIC: No reports of sweating, cold or heat intolerance. No polyuria or polydipsia.  Marland Kitchen   Physical Examination Vitals:   12/17/17 0917  BP: 140/88  Pulse: 87  SpO2: 96%   Vitals:   12/17/17 0917  Weight: 294 lb 12.8 oz (133.7 kg)  Height: 5\' 11"  (1.803 m)    Gen: resting comfortably, no acute distress HEENT: no scleral icterus, pupils equal round and reactive, no palptable cervical adenopathy,  CV: RRR, no m/r/g, no jvd Resp: Clear to auscultation bilaterally GI: abdomen is soft, non-tender, non-distended, normal bowel sounds, no hepatosplenomegaly MSK: extremities are warm, no edema.  Skin: warm, no rash Neuro:  no focal deficits Psych: appropriate affect   Diagnostic Studies  01/2014 cath [facility] FINDINGS: 1. Left Main: Widely patent. 2. LAD: The LAD is occluded in the proximal region.The mid and distal LAD is supplied by collateral flow from the circumflex and from the right coronary artery 3. Circumflex: Widely patent. There is a small first obtuse marginal artery and a large second obtuse marginal artery. The distal circumflex trifurcates. 4. Right Coronary Artery: There is 20-30% stenosis in the proximal and mid RCA. The RCA is dominant. The PDA and PLV branches are widely patent. 5. Hemodynamics: Aortic pressure is 141/70 mmHg LV pressure is 135/10 mmHg.   LEFT VENTRICULOGRAPHY: The ejection fraction is about 55%. Wall motion is normal.  CONCLUSIONS: 1. Occluded LAD  with left to left and right to left collaterals flow. 2. Normal LV function. 3. He will be discharged home on aspirin, metoprolol, Imdur, Plavix, and a statin. He will followup with Dr. Hamilton Capri in 2-4 weeks    Jan 2018 Nuclear stress  Blood pressure demonstrated a normal response to exercise. Nonlimiting exercise induced chest pain.  There was no ST segment deviation noted during stress. Duke treadmill score is 3, moderate risk  Findings consistent with prior apical and distal anterolateralmyocardial infarction with significant peri-infarct ischemia.  The left ventricular ejection fraction is mildly decreased (45-54%).  Overall moderate to high risk findings based on Duke treadmill score, mildly decreased LVEF, and ischemia by imaging.    Jan 2018 echo Study Conclusions  - Left ventricle: The cavity size was normal. Wall thickness was increased in a pattern of mild LVH. Systolic function was normal. The estimated ejection fraction was in the range of 55% to 60%. Wall motion  was normal; there were no regional wall motion abnormalities. Left ventricular diastolic function parameters were normal. - Aortic valve: Valve area (VTI): 3.41 cm^2. Valve area (Vmax): 2.78 cm^2. Valve area (Vmean): 2.87 cm^2. - Technically adequate study.  06/2017 cath  Left Anterior Descending  Prox LAD lesion 100% stenosed  Prox LAD lesion is 100% stenosed.  Second Septal Terrilynn Postell  Collaterals  2nd Sept filled by collaterals from Inf Sept.    Right Coronary Artery  Prox RCA lesion 50% stenosed  Prox RCA lesion is 50% stenosed.  Dist RCA lesion 40% stenosed  Dist RCA lesion is 40% stenosed.  Right Posterior Descending Artery  Ost RPDA lesion 40% stenosed  Ost RPDA lesion is 40% stenosed.     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 Left to right and right to left collaterals are noted.  Widely patent left main  Normal  circumflex coronary artery  Moderately diseased right coronary with eccentric 50 to 60% proximal to mid lesion 40% eccentric distal lesion and 40% ostial PDA stenosis.  Abnormal left ventricular function with anteroapical hypokinesis. EF 45 to 50%. Normal filling pressures.  RECOMMENDATIONS:   Aggressive risk factor modification: Discussed smoking cessation, better control of diabetes, diet, management of sleep apnea.  Consider for CTO LAD. Will refer the patient to the CTO team.  Treatment considerations include CTO LAD if possible followed by FFR and possibly stenting of the RCA. If LAD recanalization is not possible, coronary bypass grafting with LIMA to LAD and grafting of diagonal and right coronary will be an alternative consideration given impairment in LV function.  08/2017 CTO PCI  Prox LAD lesion is 100% stenosed.  A drug-eluting stent was successfully placed using a STENT SYNERGY DES 2.5X38.  A drug-eluting stent was successfully placed using a STENT SYNERGY DES 2.5X16.  Post intervention, there is a 0% residual stenosis.  Ost 1st Diag to 1st Diag lesion is 100% stenosed.  1. Successful CTO PCI of the LAD with DES x 2.  Post cath Recommend uninterrupted dual antiplatelet therapy with Aspirin 81mg  daily and Clopidogrel 75mg  dailyfor a minimum of 12 months (ACS - Class I recommendation).     Assessment and Plan  1. CAD - no recent symptoms. Continue DAPT at least until 08/2017  2. Hyperlipidema - LDL at goal, continue statin  3. HTN - above goal. Restart lisinopril 5mg  daily, had been stopped prior to his last PCI when needed to titrate antianginals and was having soft bp's/    F/u 6 months   Arnoldo Lenis, M.D.

## 2017-12-17 NOTE — Patient Instructions (Signed)
Your physician wants you to follow-up in: [place] will receive a reminder letter in the mail two months in advance. If you don't receive a letter, please call our office to schedule the follow-up appointment.  Your physician has recommended you make the following change in your medication:   START LISINOPRIL 5 MG DAILY   Thank you for choosing [facility]!!

## 2017-12-24 ENCOUNTER — Ambulatory Visit (INDEPENDENT_AMBULATORY_CARE_PROVIDER_SITE_OTHER): Payer: BLUE CROSS/BLUE SHIELD | Admitting: Family Medicine

## 2017-12-24 ENCOUNTER — Encounter: Payer: Self-pay | Admitting: Family Medicine

## 2017-12-24 VITALS — BP 125/74 | HR 80 | Temp 97.3°F | Ht 71.0 in | Wt 292.6 lb

## 2017-12-24 DIAGNOSIS — I10 Essential (primary) hypertension: Secondary | ICD-10-CM

## 2017-12-24 DIAGNOSIS — E1159 Type 2 diabetes mellitus with other circulatory complications: Secondary | ICD-10-CM | POA: Diagnosis not present

## 2017-12-24 DIAGNOSIS — E1169 Type 2 diabetes mellitus with other specified complication: Secondary | ICD-10-CM | POA: Diagnosis not present

## 2017-12-24 DIAGNOSIS — E118 Type 2 diabetes mellitus with unspecified complications: Secondary | ICD-10-CM

## 2017-12-24 DIAGNOSIS — E785 Hyperlipidemia, unspecified: Secondary | ICD-10-CM

## 2017-12-24 LAB — BAYER DCA HB A1C WAIVED: HB A1C (BAYER DCA - WAIVED): 6.6 % (ref <7.0)

## 2017-12-24 MED ORDER — LINAGLIPTIN-METFORMIN HCL ER 5-1000 MG PO TB24: 1.0000 | ORAL_TABLET | Freq: Every day | ORAL | 3 refills | Status: DC

## 2017-12-24 NOTE — Progress Notes (Signed)
BP 125/74   Pulse 80   Temp (!) 97.3 F (36.3 C) (Oral)   Ht _0  (1.803 m)   Wt 292 lb 9.6 oz (132.7 kg)   BMI 40.81 kg/m    Subjective:    Patient ID: Carl [name], male    DOB: 1975-05-03, 42 y.o.   MRN: [id number]  HPI: Carl [name] is a 42 y.o. male presenting on 12/24/2017 for Diabetes (check up on chronic medical conditions) and Hypertension   HPI Type 2 diabetes mellitus Patient comes in today for recheck of his diabetes. Patient has been currently taking Jentadueto. Patient is currently on an ACE inhibitor/ARB. Patient has not seen an ophthalmologist this year. Patient denies any issues with their feet.   Hyperlipidemia Patient is coming in for recheck of his hyperlipidemia. The patient is currently taking atorvastatin. They deny any issues with myalgias or history of liver damage from it. They deny any focal numbness or weakness or chest pain.  Patient has CAD and recently had a stent and sees cardiology for this  Hypertension Patient is currently on lisinopril and metoprolol, and their blood pressure today is 125/74. Patient denies any lightheadedness or dizziness. Patient denies headaches, blurred vision, chest pains, shortness of breath, or weakness. Denies any side effects from medication and is content with current medication.  Patient has CAD and recently had a stent  Relevant past medical, surgical, family and social history reviewed and updated as indicated. Interim medical history since our last visit reviewed. Allergies and medications reviewed and updated.  Review of Systems  Constitutional: Negative for chills and fever.  Eyes: Negative for visual disturbance.  Respiratory: Negative for shortness of breath and wheezing.   Cardiovascular: Negative for chest pain and leg swelling.  Musculoskeletal: Negative for back pain and gait problem.  Skin: Negative for rash.  Neurological: Negative for dizziness, weakness, light-headedness and numbness.  All other  systems reviewed and are negative.   Per HPI unless specifically indicated above   Allergies as of 12/24/2017   No Known Allergies     Medication List        Accurate as of 12/24/17  2:36 PM. Always use your most recent med list.          albuterol 108 (90 Base) MCG/ACT inhaler Commonly known as:  PROVENTIL HFA;VENTOLIN HFA Inhale 2 puffs into the lungs every 6 (six) hours as needed for wheezing or shortness of breath.   aspirin EC 81 MG tablet Take 81 mg by mouth daily.   atorvastatin 80 MG tablet Commonly known as:  LIPITOR Take 1 tablet (80 mg total) by mouth daily.   clopidogrel 75 MG tablet Commonly known as:  PLAVIX Take 1 tablet (75 mg total) by mouth daily.   linaGLIPtin-metFORMIN HCl ER 06-998 MG Tb24 Take 1 tablet by mouth daily.   lisinopril 5 MG tablet Commonly known as:  PRINIVIL,ZESTRIL Take 1 tablet (5 mg total) by mouth daily.   metoprolol succinate 50 MG 24 hr tablet Commonly known as:  TOPROL-XL Take 1 tablet (50 mg total) by mouth daily. Take with or immediately following a meal.   nitroGLYCERIN 0.4 MG SL tablet Commonly known as:  NITROSTAT Place 1 tablet (0.4 mg total) under the tongue every 5 (five) minutes as needed for chest pain.   vitamin C 1000 MG tablet Take 2,000 mg by mouth daily.          Objective:    BP 125/74   Pulse 80  Temp (!) 97.3 F (36.3 C) (Oral)   Ht _0  (1.803 m)   Wt 292 lb 9.6 oz (132.7 kg)   BMI 40.81 kg/m   Wt Readings from Last 3 Encounters:  12/24/17 292 lb 9.6 oz (132.7 kg)  12/17/17 294 lb 12.8 oz (133.7 kg)  09/09/17 288 lb (130.6 kg)    Physical Exam  Constitutional: He is oriented to person, place, and time. He appears well-developed and well-nourished. No distress.  Eyes: Conjunctivae are normal. No scleral icterus.  Neck: Neck supple. No thyromegaly present.  Cardiovascular: Normal rate, regular rhythm, normal heart sounds and intact distal pulses.  No murmur heard. Pulmonary/Chest:  Effort normal and breath sounds normal. No respiratory distress. He has no wheezes.  Musculoskeletal: Normal range of motion. He exhibits no edema.  Lymphadenopathy:    He has no cervical adenopathy.  Neurological: He is alert and oriented to person, place, and time. Coordination normal.  Skin: Skin is warm and dry. No rash noted. He is not diaphoretic.  Psychiatric: He has a normal mood and affect. His behavior is normal.  Nursing note and vitals reviewed.     Assessment & Plan:   Problem List Items Addressed This Visit      Cardiovascular and Mediastinum   Hypertension associated with diabetes ([place])   Relevant Medications   linaGLIPtin-metFORMIN HCl ER (JENTADUETO XR) 06-998 MG TB24   Other Relevant Orders   CMP14+EGFR     Endocrine   Hyperlipidemia associated with type 2 diabetes mellitus (HCC)   Relevant Medications   linaGLIPtin-metFORMIN HCl ER (JENTADUETO XR) 06-998 MG TB24   Other Relevant Orders   Lipid panel   Controlled diabetes mellitus type 2 with complications ([place]) - Primary   Relevant Medications   linaGLIPtin-metFORMIN HCl ER (JENTADUETO XR) 06-998 MG TB24   Other Relevant Orders   Bayer DCA Hb A1c Waived   CBC with Differential/Platelet   CMP14+EGFR     Other   Morbid obesity (HCC)   Relevant Medications   linaGLIPtin-metFORMIN HCl ER (JENTADUETO XR) 06-998 MG TB24       Follow up plan: Return in about 3 months (around 03/26/2018), or if symptoms worsen or fail to improve, for Diabetes hypertension.  Counseling provided for all of the vaccine components Orders Placed This Encounter  Procedures  . Bayer DCA Hb A1c Waived  . CBC with Differential/Platelet  . CMP14+EGFR  . Lipid panel    Caryl Pina, MD [facility] 12/24/2017, 2:36 PM

## 2017-12-25 LAB — CBC WITH DIFFERENTIAL/PLATELET
BASOS ABS: 0 10*3/uL (ref 0.0–0.2)
Basos: 0 %
EOS (ABSOLUTE): 0.2 10*3/uL (ref 0.0–0.4)
Eos: 2 %
HEMOGLOBIN: 15.4 g/dL (ref 13.0–17.7)
Hematocrit: 45 % (ref 37.5–51.0)
IMMATURE GRANS (ABS): 0 10*3/uL (ref 0.0–0.1)
IMMATURE GRANULOCYTES: 0 %
LYMPHS: 34 %
Lymphocytes Absolute: 3.1 10*3/uL (ref 0.7–3.1)
MCH: 31.6 pg (ref 26.6–33.0)
MCHC: 34.2 g/dL (ref 31.5–35.7)
MCV: 92 fL (ref 79–97)
MONOCYTES: 10 %
Monocytes Absolute: 0.9 10*3/uL (ref 0.1–0.9)
NEUTROS PCT: 54 %
Neutrophils Absolute: 4.7 10*3/uL (ref 1.4–7.0)
PLATELETS: 194 10*3/uL (ref 150–450)
RBC: 4.87 x10E6/uL (ref 4.14–5.80)
RDW: 12.6 % (ref 12.3–15.4)
WBC: 8.9 10*3/uL (ref 3.4–10.8)

## 2017-12-25 LAB — LIPID PANEL
Chol/HDL Ratio: 4.2 ratio (ref 0.0–5.0)
Cholesterol, Total: 108 mg/dL (ref 100–199)
HDL: 26 mg/dL — AB (ref >39)
LDL Calculated: 60 mg/dL (ref 0–99)
Triglycerides: 112 mg/dL (ref 0–149)
VLDL CHOLESTEROL CAL: 22 mg/dL (ref 5–40)

## 2017-12-25 LAB — CMP14+EGFR
ALT: 24 IU/L (ref 0–44)
AST: 16 IU/L (ref 0–40)
Albumin/Globulin Ratio: 1.6 (ref 1.2–2.2)
Albumin: 4.6 g/dL (ref 3.5–5.5)
Alkaline Phosphatase: 78 IU/L (ref 39–117)
BILIRUBIN TOTAL: 0.4 mg/dL (ref 0.0–1.2)
BUN/Creatinine Ratio: 16 (ref 9–20)
BUN: 15 mg/dL (ref 6–24)
CALCIUM: 9.3 mg/dL (ref 8.7–10.2)
CHLORIDE: 106 mmol/L (ref 96–106)
CO2: 20 mmol/L (ref 20–29)
Creatinine, Ser: 0.96 mg/dL (ref 0.76–1.27)
GFR, EST AFRICAN AMERICAN: 111 mL/min/{1.73_m2} (ref >59)
GFR, EST NON AFRICAN AMERICAN: 96 mL/min/{1.73_m2} (ref >59)
Globulin, Total: 2.8 g/dL (ref 1.5–4.5)
Glucose: 99 mg/dL (ref 65–99)
Potassium: 4.4 mmol/L (ref 3.5–5.2)
Sodium: 139 mmol/L (ref 134–144)
TOTAL PROTEIN: 7.4 g/dL (ref 6.0–8.5)

## 2018-02-09 ENCOUNTER — Telehealth: Payer: Self-pay | Admitting: Family Medicine

## 2018-02-09 ENCOUNTER — Inpatient Hospital Stay
Admit: 2018-02-09 | Discharge: 2018-02-09 | Disposition: A | Discharge status: Home / Self Care | Payer: BLUE CROSS/BLUE SHIELD | Attending: Emergency Medicine

## 2018-02-09 DIAGNOSIS — M549 Dorsalgia, unspecified: Secondary | ICD-10-CM

## 2018-02-09 MED ORDER — NAPROXEN 500 MG TAB
500 mg | ORAL_TABLET | Freq: Two times a day (BID) | ORAL | 0 refills | Status: AC
Start: 2018-02-09 — End: 2018-02-19

## 2018-02-09 MED ORDER — KETOROLAC TROMETHAMINE 30 MG/ML INJECTION
30 mg/mL (1 mL) | INTRAMUSCULAR | Status: AC
Start: 2018-02-09 — End: 2018-02-09
  Administered 2018-02-09: 15:00:00 via INTRAMUSCULAR

## 2018-02-09 MED ORDER — METHOCARBAMOL 500 MG TAB
500 mg | ORAL_TABLET | Freq: Four times a day (QID) | ORAL | 0 refills | Status: AC
Start: 2018-02-09 — End: ?

## 2018-02-09 MED FILL — KETOROLAC TROMETHAMINE 30 MG/ML INJECTION: 30 mg/mL (1 mL) | INTRAMUSCULAR | Qty: 1

## 2018-02-09 NOTE — ED Notes (Signed)
NP at bedside.

## 2018-02-09 NOTE — ED Provider Notes (Signed)
ED Provider Notes by Marilynne Halsted, NP at 02/09/18 0930                Author: Marilynne Halsted, NP  Service: --  Author Type: Nurse Practitioner       Filed: 02/09/18 1008  Date of Service: 02/09/18 0930  Status: Attested           Editor: Marilynne Halsted, NP (Nurse Practitioner)  Cosigner: Robert Bellow, MD at 02/09/18 1545          Attestation signed by Robert Bellow, MD at 02/09/18 1545          I was personally available for consultation in the emergency department.  I have reviewed the chart and agree with the documentation recorded by the mid level  provider, including the assessment, treatment plan, and disposition.   Robert Bellow, MD                                 EMERGENCY DEPARTMENT HISTORY AND PHYSICAL EXAM           Date: 02/09/2018   Patient Name: Marco Valdez        History of Presenting Illness          Chief Complaint       Patient presents with        ?  Shoulder Pain             c/o left shoulder/neck pain x 4 days; denies injury           History Provided By: Patient      HPI: Marco Valdez,  42 y.o. male presents by POV to the ED with cc of complains of left scapular pain for 4  days.  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  Patient states he has not had a pain like this and he also denies fall, injury or direct blow to this area.  Patient denies chest pain, shortness  of breath, nausea and vomiting or any other symptoms at this time.      There are no other complaints, changes, or physical findings at this time.      PCP: Other, Phys, MD        No current facility-administered medications on file prior to encounter.           No current outpatient medications on file prior to encounter.             Past History        Past Medical History:     Past Medical History:        Diagnosis  Date         ?  Asthma       ?  Diabetes (HCC)           ?  Hypertension             Past Surgical History:     Past  Surgical History:         Procedure  Laterality  Date          ?  CARDIAC SURG PROCEDURE UNLIST         ?  HX CORONARY STENT PLACEMENT              ?  HX ORTHOPAEDIC  Family History:   History reviewed. No pertinent family history.      Social History:     Social History          Tobacco Use         ?  Smoking status:  Current Every Day Smoker              Packs/day:  2.50         ?  Smokeless tobacco:  Never Used       Substance Use Topics         ?  Alcohol use:  Not Currently         ?  Drug use:  Not Currently           Allergies:   No Known Allergies           Review of Systems     Review of Systems    Constitutional: Negative for activity change, chills and fever.    HENT: Negative for congestion, rhinorrhea and sore throat.     Respiratory: Negative for cough and shortness of breath.     Cardiovascular: Negative for chest pain.    Gastrointestinal: Negative for abdominal pain, constipation, diarrhea, nausea and vomiting.    Endocrine: Negative for polyuria.    Genitourinary: Negative for dysuria and frequency.    Musculoskeletal: Positive for arthralgias, back pain  and myalgias.    Neurological: Negative for dizziness and headaches.    All other systems reviewed and are negative.           Physical Exam     Physical Exam   Vitals signs and nursing note reviewed.   Constitutional:        General: He is not in acute distress.     Appearance: He is well-developed. He is not diaphoretic.      Comments: 42 y.o. male    HENT :       Head: Normocephalic and atraumatic.      Nose: Nose normal.      Mouth/Throat:      Mouth: Mucous membranes are moist.      Pharynx: Oropharynx is clear.   Eyes:       Conjunctiva/sclera: Conjunctivae normal.      Pupils: Pupils are equal, round, and reactive to light.    Neck:       Musculoskeletal: Normal range of motion.   Cardiovascular :       Rate and Rhythm: Normal rate and regular rhythm.      Heart sounds: Normal heart sounds. No murmur.    Pulmonary:        Effort: Pulmonary effort is normal. No respiratory distress.      Breath sounds: Normal breath sounds. No wheezing.    Musculoskeletal: Normal range of motion.          General: Tenderness present. No swelling, deformity or signs of injury.      Thoracic back: He exhibits  tenderness, pain and spasm . He exhibits no swelling, no edema, no deformity and no laceration.      Right lower leg: No edema.      Left lower leg: No edema.    Skin:      General: Skin is warm and dry.      Capillary Refill: Capillary refill takes less than 2 seconds.    Neurological:       General: No focal deficit present.      Mental Status: He is  alert and oriented to person, place, and time. Mental status is at baseline.   Psychiatric:         Mood and Affect: Mood normal.         Behavior: Behavior normal.               Diagnostic Study Results        Labs -    No results found for this or any previous visit (from the past 12 hour(s)).      Radiologic Studies -         Medical Decision Making     I am the first provider for this patient.      I reviewed the vital signs, available nursing notes, past medical history, past surgical history, family history and social history.      Vital Signs-Reviewed the patient's vital signs.   Patient Vitals for the past 12 hrs:            Temp  Pulse  Resp  BP  SpO2            02/09/18 0943  97.9 ??F (36.6 ??C)  83  18  147/88  98 %           Records Reviewed: Nursing Notes, Old Medical Records, Previous Radiology Studies and  Previous Laboratory Studies      Provider Notes (Medical Decision Making):    Differential diagnoses include hematoma, muscle strain, muscle      ED Course:    Initial assessment performed. The patients presenting problems have been discussed, and they are in agreement with the care plan formulated and outlined with them.  I have encouraged them to ask questions as they arise throughout their visit.      Discussed with patient taking anti-inflammatory medication and muscle relaxant to  help reduce symptoms of musculoskeletal pain.  Advised patient do not take muscle relaxant while driving,  operating heavy machinery, may cause drowsiness.  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  Otherwise, if new symptoms occur or  worsen, proceed to the nearest emergency department for further evaluation.      Critical Care Time: None      Disposition:   DISCHARGE NOTE:   10:05 AM   The pt is ready for discharge. The pt's signs, symptoms, diagnosis, and discharge instructions have been discussed and pt has conveyed their understanding. The pt is to follow up  as recommended or return to ER should their symptoms worsen. Plan has been discussed and pt is in agreement.   Marilynne Halstedarshawa L McGuire, NP   02/09/2018         PLAN:   1.      Current Discharge Medication List              START taking these medications          Details        naproxen (NAPROSYN) 500 mg tablet  Take 1 Tab by mouth two (2) times daily (with meals) for 10 days.   Qty: 20 Tab, Refills:  0               methocarbamol (ROBAXIN) 500 mg tablet  Take 1 Tab by mouth four (4) times daily.   Qty: 30 Tab, Refills:  0  2.      Follow-up Information               Follow up With  Specialties  Details  Why  Contact Info              [facility]  Emergency Medicine  Go in 1 week  As needed, If symptoms worsen  [address]   [place] [address]   [phone]              [facility]  Internal Medicine  Schedule an appointment as soon as possible for a visit in 3 days  As needed, If symptoms worsen  [address]   [place] [address]   [phone]             Return to ED if worse         Diagnosis        Clinical Impression:       1.  Musculoskeletal back pain                 Please note that this dictation was completed with Dragon, the  computer voice recognition software. Quite often unanticipated grammatical, syntax, homophones, and other interpretive errors are inadvertently  transcribed by the computer software. Please disregards these errors. Please excuse any errors that have escaped final proofreading.       This note will not be viewable in MyChart.

## 2018-02-09 NOTE — ED Notes (Signed)
Pt discharged in stable condition with family

## 2018-02-09 NOTE — ED Provider Notes (Signed)
EMERGENCY DEPARTMENT HISTORY AND PHYSICAL EXAM      Date: 02/09/2018  Patient Name: Marco Valdez    History of Presenting Illness     Chief Complaint   Patient presents with   ??? Shoulder Pain     c/o left shoulder/neck pain x 4 days; denies injury       History Provided By: Patient    HPI: Marco GeroldJames Hatfield, 42 y.o. male presents by POV to the ED with cc of complains of left scapular pain for 4 days.  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  Patient states he has not had a pain like this and he also denies fall, injury or direct blow to this area.  Patient denies chest pain, shortness of breath, nausea and vomiting or any other symptoms at this time.    There are no other complaints, changes, or physical findings at this time.    PCP: Other, Phys, MD    No current facility-administered medications on file prior to encounter.      No current outpatient medications on file prior to encounter.       Past History     Past Medical History:  Past Medical History:   Diagnosis Date   ??? Asthma    ??? Diabetes (HCC)    ??? Hypertension        Past Surgical History:  Past Surgical History:   Procedure Laterality Date   ??? CARDIAC SURG PROCEDURE UNLIST     ??? HX CORONARY STENT PLACEMENT     ??? HX ORTHOPAEDIC         Family History:  History reviewed. No pertinent family history.    Social History:  Social History     Tobacco Use   ??? Smoking status: Current Every Day Smoker     Packs/day: 2.50   ??? Smokeless tobacco: Never Used   Substance Use Topics   ??? Alcohol use: Not Currently   ??? Drug use: Not Currently       Allergies:  No Known Allergies      Review of Systems   Review of Systems   Constitutional: Negative for activity change, chills and fever.   HENT: Negative for congestion, rhinorrhea and sore throat.    Respiratory: Negative for cough and shortness of breath.    Cardiovascular: Negative for chest pain.    Gastrointestinal: Negative for abdominal pain, constipation, diarrhea, nausea and vomiting.   Endocrine: Negative for polyuria.   Genitourinary: Negative for dysuria and frequency.   Musculoskeletal: Positive for arthralgias, back pain and myalgias.   Neurological: Negative for dizziness and headaches.   All other systems reviewed and are negative.      Physical Exam   Physical Exam  Vitals signs and nursing note reviewed.   Constitutional:       General: He is not in acute distress.     Appearance: He is well-developed. He is not diaphoretic.      Comments: 42 y.o. male   HENT:      Head: Normocephalic and atraumatic.      Nose: Nose normal.      Mouth/Throat:      Mouth: Mucous membranes are moist.      Pharynx: Oropharynx is clear.   Eyes:      Conjunctiva/sclera: Conjunctivae normal.      Pupils: Pupils are equal, round, and reactive to light.   Neck:  Musculoskeletal: Normal range of motion.   Cardiovascular:      Rate and Rhythm: Normal rate and regular rhythm.      Heart sounds: Normal heart sounds. No murmur.   Pulmonary:      Effort: Pulmonary effort is normal. No respiratory distress.      Breath sounds: Normal breath sounds. No wheezing.   Musculoskeletal: Normal range of motion.         General: Tenderness present. No swelling, deformity or signs of injury.      Thoracic back: He exhibits tenderness, pain and spasm. He exhibits no swelling, no edema, no deformity and no laceration.      Right lower leg: No edema.      Left lower leg: No edema.   Skin:     General: Skin is warm and dry.      Capillary Refill: Capillary refill takes less than 2 seconds.   Neurological:      General: No focal deficit present.      Mental Status: He is alert and oriented to person, place, and time. Mental status is at baseline.   Psychiatric:         Mood and Affect: Mood normal.         Behavior: Behavior normal.         Diagnostic Study Results     Labs -    No results found for this or any previous visit (from the past 12 hour(s)).    Radiologic Studies -     Medical Decision Making   I am the first provider for this patient.    I reviewed the vital signs, available nursing notes, past medical history, past surgical history, family history and social history.    Vital Signs-Reviewed the patient's vital signs.  Patient Vitals for the past 12 hrs:   Temp Pulse Resp BP SpO2   02/09/18 0943 97.9 ??F (36.6 ??C) 83 18 147/88 98 %       Records Reviewed: Nursing Notes, Old Medical Records, Previous Radiology Studies and Previous Laboratory Studies    Provider Notes (Medical Decision Making):   Differential diagnoses include hematoma, muscle strain, muscle    ED Course:   Initial assessment performed. The patients presenting problems have been discussed, and they are in agreement with the care plan formulated and outlined with them.  I have encouraged them to ask questions as they arise throughout their visit.    Discussed with patient taking anti-inflammatory medication and muscle relaxant to help reduce symptoms of musculoskeletal pain.  Advised patient do not take muscle relaxant while driving, operating heavy machinery, may cause drowsiness.  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  Otherwise, if new symptoms occur or worsen, proceed to the nearest emergency department for further evaluation.    Critical Care Time: None    Disposition:  DISCHARGE NOTE:  10:05 AM  The pt is ready for discharge. The pt's signs, symptoms, diagnosis, and discharge instructions have been discussed and pt has conveyed their understanding. The pt is to follow up as recommended or return to ER should their symptoms worsen. Plan has been discussed and pt is in agreement.  Marilynne Halsted, NP  02/09/2018      PLAN:  1.   Current Discharge Medication List       START taking these medications    Details  naproxen (NAPROSYN) 500 mg tablet Take 1 Tab by mouth two (2) times daily (with meals) for 10 days.  Qty: 20 Tab, Refills: 0      methocarbamol (ROBAXIN) 500 mg tablet Take 1 Tab by mouth four (4) times daily.  Qty: 30 Tab, Refills: 0           2.   Follow-up Information     Follow up With Specialties Details Why Contact Info    [facility] Emergency Medicine Go in 1 week As needed, If symptoms worsen [address]  [place] [address]  [phone]    [facility] Internal Medicine Schedule an appointment as soon as possible for a visit in 3 days As needed, If symptoms worsen [address]  [place] [address]  [phone]        Return to ED if worse     Diagnosis     Clinical Impression:   1. Musculoskeletal back pain          Please note that this dictation was completed with Dragon, the computer voice recognition software. Quite often unanticipated grammatical, syntax, homophones, and other interpretive errors are inadvertently transcribed by the computer software. Please disregards these errors. Please excuse any errors that have escaped final proofreading.     This note will not be viewable in MyChart.

## 2018-02-09 NOTE — ED Notes (Signed)
NP at bedside

## 2018-02-09 NOTE — Telephone Encounter (Signed)
Pt seen in ER for L shoulder pain Was given Naproxen Pt states he can not take this RX appt scheduled for new RX Pt verbalizes understanding

## 2018-02-10 ENCOUNTER — Ambulatory Visit: Payer: BLUE CROSS/BLUE SHIELD | Admitting: Family Medicine

## 2018-02-10 ENCOUNTER — Other Ambulatory Visit: Payer: Self-pay | Admitting: Family Medicine

## 2018-02-10 ENCOUNTER — Encounter: Payer: Self-pay | Admitting: Family Medicine

## 2018-02-10 VITALS — BP 139/82 | HR 81 | Temp 96.2°F | Ht 71.0 in | Wt 285.0 lb

## 2018-02-10 DIAGNOSIS — M25512 Pain in left shoulder: Secondary | ICD-10-CM

## 2018-02-10 MED ORDER — METHYLPREDNISOLONE ACETATE 80 MG/ML IJ SUSP
80.0000 mg | Freq: Once | INTRAMUSCULAR | Status: AC
  Administered 2018-02-10: 80 mg via INTRAMUSCULAR

## 2018-02-10 NOTE — Patient Instructions (Signed)
Shoulder Pain Many things can cause shoulder pain, including:  An injury.  Moving the shoulder in the same way again and again (overuse).  Joint pain (arthritis). Pain can come from:  Swelling and irritation (inflammation) of any part of the shoulder.  An injury to the shoulder joint.  An injury to: ? Tissues that connect muscle to bone (tendons). ? Tissues that connect bones to each other (ligaments). ? Bones. Follow these instructions at home: Watch for changes in your symptoms. Let your doctor know about them. Follow these instructions to help with your pain. If you have a sling:  Wear the sling as told by your doctor. Remove it only as told by your doctor.  Loosen the sling if your fingers: ? Tingle. ? Become numb. ? Turn cold and blue.  Keep the sling clean.  If the sling is not waterproof: ? Do not let it get wet. ? Take the sling off when you shower or bathe. Managing pain, stiffness, and swelling   If told, put ice on the painful area: ? Put ice in a plastic bag. ? Place a towel between your skin and the bag. ? Leave the ice on for 20 minutes, 2-3 times a day. Stop putting ice on if it does not help with the pain.  Squeeze a soft ball or a foam pad as much as possible. This prevents swelling in the shoulder. It also helps to strengthen the arm. General instructions  Take over-the-counter and prescription medicines only as told by your doctor.  Keep all follow-up visits as told by your doctor. This is important. Contact a doctor if:  Your pain gets worse.  Medicine does not help your pain.  You have new pain in your arm, hand, or fingers. Get help right away if:  Your arm, hand, or fingers: ? Tingle. ? Are numb. ? Are swollen. ? Are painful. ? Turn white or blue. Summary  Shoulder pain can be caused by many things. These include injury, moving the shoulder in the same away again and again, and joint pain.  Watch for changes in your symptoms.  Let your doctor know about them.  This condition may be treated with a sling, ice, and pain medicine.  Contact your doctor if the pain gets worse or you have new pain. Get help right away if your arm, hand, or fingers tingle or get numb, swollen, or painful.  Keep all follow-up visits as told by your doctor. This is important. This information is not intended to replace advice given to you by your health care provider. Make sure you discuss any questions you have with your health care provider. Document Released: 07/29/2007 Document Revised: 08/24/2017 Document Reviewed: 08/24/2017 Elsevier Interactive Patient Education  2019 [employer]  

## 2018-02-10 NOTE — Progress Notes (Signed)
Subjective:    Patient ID: Carl [name], male    DOB: 02/01/1976, 42 y.o.   MRN: [id number]  Chief Complaint:  Left shoulder pain   HPI: Carl [name] is a 42 y.o. male presenting on 02/10/2018 for Left shoulder pain   1. Acute pain of left shoulder   Pt presents today with complaints of left shoulder blade pain. This is a new problem. The current episode started on Sunday. The problem occurs intermittently. The problem has been worsening. The quality of the pain is described as sharp and aching. The pain is at a severity of 7/10. The pain is intermittent, worse with certain movements, and with palpation. Associated symptoms include numbness to left lateral forearm. Pt denies chest pain, shortness of breath, palpitations, or recent injury. He denies loss of function or weakness. Pt has tried nothing for the symptoms. He was seen in the ED in [place] and was prescribed Robaxin and Naproxen. States he has not started these medications and wanted to make sure they were ok too take.    Relevant past medical, surgical, family, and social history reviewed and updated as indicated.  Allergies and medications reviewed and updated.   Past Medical History:  Diagnosis Date  . Anxiety   . CAD in native artery    a. cath in 2015 [facility] showing occluded proximal LAD, LAD filled with left-left and right-left collaterals, otherwise 20-30% prox-mid RCA, LVEF 55%. b. Abnormal nuc 02/2016 - [facility] medically.  . Daily headache    "since I started Plavix" (08/25/2017)  . Depression   . Diabetes mellitus type 2 in obese ([place])   . Hyperlipidemia   . Hypertension   . OSA on CPAP     Past Surgical History:  Procedure Laterality Date  . CORONARY CTO INTERVENTION N/A 08/25/2017   Procedure: CORONARY CTO INTERVENTION;  Surgeon: Martinique, Peter M, MD;  Location: [facility];  Service: Cardiovascular;  Laterality: N/A;  . LEFT HEART CATH AND CORONARY ANGIOGRAPHY N/A 07/21/2017   Procedure: LEFT HEART  CATH AND CORONARY ANGIOGRAPHY;  Surgeon: Belva Crome, MD;  Location: [facility];  Service: Cardiovascular;  Laterality: N/A;  . MOLE REMOVAL Right    "chin"  . TUMOR EXCISION Left 1991   knee area    Social History   Socioeconomic History  . Marital status: Divorced    Spouse name: Not on file  . Number of children: Not on file  . Years of education: Not on file  . Highest education level: Not on file  Occupational History  . Not on file  Social Needs  . Financial resource strain: Not on file  . Food insecurity:    Worry: Not on file    Inability: Not on file  . Transportation needs:    Medical: Not on file    Non-medical: Not on file  Tobacco Use  . Smoking status: Current Every Day Smoker    Packs/day: 2.00    Years: 24.00    Pack years: 48.00    Types: Cigarettes    Start date: 01/03/1993  . Smokeless tobacco: Never Used  Substance and Sexual Activity  . Alcohol use: Yes    Comment: 08/25/2017 "0-a few drinks/year"  . Drug use: Never  . Sexual activity: Yes  Lifestyle  . Physical activity:    Days per week: Not on file    Minutes per session: Not on file  . Stress: Not on file  Relationships  .  Social connections:    Talks on phone: Not on file    Gets together: Not on file    Attends religious service: Not on file    Active member of club or organization: Not on file    Attends meetings of clubs or organizations: Not on file    Relationship status: Not on file  . Intimate partner violence:    Fear of current or ex partner: Not on file    Emotionally abused: Not on file    Physically abused: Not on file    Forced sexual activity: Not on file  Other Topics Concern  . Not on file  Social History Narrative  . Not on file    Outpatient Encounter Medications as of 02/10/2018  Medication Sig  . albuterol (PROVENTIL HFA;VENTOLIN HFA) 108 (90 Base) MCG/ACT inhaler Inhale 2 puffs into the lungs every 6 (six) hours as needed for wheezing or shortness  of breath. (Patient taking differently: Inhale 1-2 puffs into the lungs every 6 (six) hours as needed for wheezing or shortness of breath. )  . Ascorbic Acid (VITAMIN C) 1000 MG tablet Take 2,000 mg by mouth daily.  Marland Kitchen aspirin EC 81 MG tablet Take 81 mg by mouth daily.   Marland Kitchen atorvastatin (LIPITOR) 80 MG tablet Take 1 tablet (80 mg total) by mouth daily.  . clopidogrel (PLAVIX) 75 MG tablet Take 1 tablet (75 mg total) by mouth daily.  Marland Kitchen linaGLIPtin-metFORMIN HCl ER (JENTADUETO XR) 06-998 MG TB24 Take 1 tablet by mouth daily.  Marland Kitchen lisinopril (PRINIVIL,ZESTRIL) 5 MG tablet Take 1 tablet (5 mg total) by mouth daily.  . metoprolol succinate (TOPROL-XL) 50 MG 24 hr tablet Take 1 tablet (50 mg total) by mouth daily. Take with or immediately following a meal.  . nitroGLYCERIN (NITROSTAT) 0.4 MG SL tablet Place 1 tablet (0.4 mg total) under the tongue every 5 (five) minutes as needed for chest pain.  . methocarbamol (ROBAXIN) 500 MG tablet Take by mouth.  . naproxen (NAPROSYN) 500 MG tablet Take by mouth.   Facility-Administered Encounter Medications as of 02/10/2018  Medication  . methylPREDNISolone acetate (DEPO-MEDROL) injection 80 mg    No Known Allergies  Review of Systems  Constitutional: Negative for chills, fatigue and fever.  Respiratory: Negative for chest tightness and shortness of breath.   Cardiovascular: Negative for chest pain, palpitations and leg swelling.  Musculoskeletal: Positive for arthralgias (left shoulder) and myalgias (left shoulder). Negative for back pain, joint swelling, neck pain and neck stiffness.  Skin: Negative for color change and wound.  Neurological: Positive for numbness (left forearm). Negative for weakness.  Psychiatric/Behavioral: Negative for confusion.  All other systems reviewed and are negative.       Objective:    BP 139/82   Pulse 81   Temp (!) 96.2 F (35.7 C) (Oral)   Ht _0  (1.803 m)   Wt 285 lb (129.3 kg)   BMI 39.75 kg/m    Wt  Readings from Last 3 Encounters:  02/10/18 285 lb (129.3 kg)  12/24/17 292 lb 9.6 oz (132.7 kg)  12/17/17 294 lb 12.8 oz (133.7 kg)    Physical Exam Vitals signs and nursing note reviewed.  Constitutional:      General: He is not in acute distress.    Appearance: Normal appearance. He is obese.  HENT:     Head: Normocephalic and atraumatic.     Mouth/Throat:     Mouth: Mucous membranes are moist.     Pharynx: Oropharynx is  clear.  Neck:     Musculoskeletal: Normal range of motion and neck supple. No neck rigidity or muscular tenderness.     Vascular: No carotid bruit.  Cardiovascular:     Rate and Rhythm: Normal rate and regular rhythm.     Pulses: Normal pulses.          Radial pulses are 2+ on the right side and 2+ on the left side.     Heart sounds: Normal heart sounds. No murmur. No friction rub. No gallop.   Pulmonary:     Effort: Pulmonary effort is normal. No respiratory distress.     Breath sounds: Normal breath sounds.  Musculoskeletal:     Left shoulder: He exhibits tenderness, pain and spasm. He exhibits normal range of motion, no bony tenderness, no swelling, no effusion, no crepitus, no deformity, no laceration, normal pulse and normal strength.     Left elbow: Normal.     Cervical back: Normal. He exhibits no tenderness.  Lymphadenopathy:     Cervical: No cervical adenopathy.  Skin:    General: Skin is warm and dry.     Capillary Refill: Capillary refill takes less than 2 seconds.  Neurological:     General: No focal deficit present.     Mental Status: He is alert.     Sensory: No sensory deficit.     Motor: No weakness.  Psychiatric:        Mood and Affect: Mood normal.        Behavior: Behavior normal.        Thought Content: Thought content normal.        Judgment: Judgment normal.     Results for orders placed or performed in visit on 12/24/17  Bayer DCA Hb A1c Waived  Result Value Ref Range   HB A1C (BAYER DCA - WAIVED) 6.6 <7.0 %  CBC with  Differential/Platelet  Result Value Ref Range   WBC 8.9 3.4 - 10.8 x10E3/uL   RBC 4.87 4.14 - 5.80 x10E6/uL   Hemoglobin 15.4 13.0 - 17.7 g/dL   Hematocrit 45.0 37.5 - 51.0 %   MCV 92 79 - 97 fL   MCH 31.6 26.6 - 33.0 pg   MCHC 34.2 31.5 - 35.7 g/dL   RDW 12.6 12.3 - 15.4 %   Platelets 194 150 - 450 x10E3/uL   Neutrophils 54 Not Estab. %   Lymphs 34 Not Estab. %   Monocytes 10 Not Estab. %   Eos 2 Not Estab. %   Basos 0 Not Estab. %   Neutrophils Absolute 4.7 1.4 - 7.0 x10E3/uL   Lymphocytes Absolute 3.1 0.7 - 3.1 x10E3/uL   Monocytes Absolute 0.9 0.1 - 0.9 x10E3/uL   EOS (ABSOLUTE) 0.2 0.0 - 0.4 x10E3/uL   Basophils Absolute 0.0 0.0 - 0.2 x10E3/uL   Immature Granulocytes 0 Not Estab. %   Immature Grans (Abs) 0.0 0.0 - 0.1 x10E3/uL  CMP14+EGFR  Result Value Ref Range   Glucose 99 65 - 99 mg/dL   BUN 15 6 - 24 mg/dL   Creatinine, Ser 0.96 0.76 - 1.27 mg/dL   GFR calc non Af Amer 96 >59 mL/min/1.73   GFR calc Af Amer 111 >59 mL/min/1.73   BUN/Creatinine Ratio 16 9 - 20   Sodium 139 134 - 144 mmol/L   Potassium 4.4 3.5 - 5.2 mmol/L   Chloride 106 96 - 106 mmol/L   CO2 20 20 - 29 mmol/L   Calcium 9.3 8.7 - 10.2 mg/dL  Total Protein 7.4 6.0 - 8.5 g/dL   Albumin 4.6 3.5 - 5.5 g/dL   Globulin, Total 2.8 1.5 - 4.5 g/dL   Albumin/Globulin Ratio 1.6 1.2 - 2.2   Bilirubin Total 0.4 0.0 - 1.2 mg/dL   Alkaline Phosphatase 78 39 - 117 IU/L   AST 16 0 - 40 IU/L   ALT 24 0 - 44 IU/L  Lipid panel  Result Value Ref Range   Cholesterol, Total 108 100 - 199 mg/dL   Triglycerides 112 0 - 149 mg/dL   HDL 26 (L) >39 mg/dL   VLDL Cholesterol Cal 22 5 - 40 mg/dL   LDL Calculated 60 0 - 99 mg/dL   Chol/HDL Ratio 4.2 0.0 - 5.0 ratio       Pertinent labs & imaging results that were available during my care of the patient were reviewed by me and considered in my medical decision making.  Assessment & Plan:  Carl [name] was seen today for left shoulder pain.  Diagnoses and all orders for  this visit:  Acute pain of left shoulder Take medications as prescribed by the ED. Report any new or worsening symptoms.  -     methylPREDNISolone acetate (DEPO-MEDROL) injection 80 mg    Continue all other maintenance medications.  Follow up plan: Return in about 2 weeks (around 02/24/2018), or if symptoms worsen or fail to improve.  Educational handout given for shoulder pain  The above assessment and management plan was discussed with the patient. The patient verbalized understanding of and has agreed to the management plan. Patient is aware to call the clinic if symptoms persist or worsen. Patient is aware when to return to the clinic for a follow-up visit. Patient educated on when it is appropriate to go to the emergency department.   Monia Pouch, FNP-C [facility] [phone]

## 2018-03-03 ENCOUNTER — Telehealth: Payer: Self-pay | Admitting: Cardiology

## 2018-03-03 NOTE — Telephone Encounter (Signed)
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

## 2018-03-04 ENCOUNTER — Encounter: Payer: Self-pay | Admitting: *Deleted

## 2018-03-04 NOTE — Telephone Encounter (Signed)
Please provide in a letter format for me.   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 Would recommend approval of DOT  from cardiac standpoint.   Carlyle Dolly MD

## 2018-03-04 NOTE — Telephone Encounter (Signed)
Patient notified and verbalized understanding.  He will pick up this evening.

## 2018-04-01 ENCOUNTER — Ambulatory Visit: Payer: BLUE CROSS/BLUE SHIELD | Admitting: Family Medicine

## 2018-04-08 ENCOUNTER — Encounter: Payer: Self-pay | Admitting: Family Medicine

## 2018-04-08 ENCOUNTER — Ambulatory Visit: Payer: BLUE CROSS/BLUE SHIELD | Admitting: Family Medicine

## 2018-04-08 VITALS — BP 115/6 | HR 71 | Temp 97.2°F | Ht 71.0 in | Wt 280.2 lb

## 2018-04-08 DIAGNOSIS — E1159 Type 2 diabetes mellitus with other circulatory complications: Secondary | ICD-10-CM

## 2018-04-08 DIAGNOSIS — E118 Type 2 diabetes mellitus with unspecified complications: Secondary | ICD-10-CM | POA: Diagnosis not present

## 2018-04-08 DIAGNOSIS — E1169 Type 2 diabetes mellitus with other specified complication: Secondary | ICD-10-CM | POA: Diagnosis not present

## 2018-04-08 DIAGNOSIS — E785 Hyperlipidemia, unspecified: Secondary | ICD-10-CM

## 2018-04-08 DIAGNOSIS — R05 Cough: Secondary | ICD-10-CM

## 2018-04-08 DIAGNOSIS — I10 Essential (primary) hypertension: Secondary | ICD-10-CM

## 2018-04-08 DIAGNOSIS — R059 Cough, unspecified: Secondary | ICD-10-CM

## 2018-04-08 LAB — BAYER DCA HB A1C WAIVED: HB A1C (BAYER DCA - WAIVED): 7.1 % — ABNORMAL HIGH (ref <7.0)

## 2018-04-08 MED ORDER — BENZONATATE 100 MG PO CAPS: 100.0000 mg | ORAL_CAPSULE | Freq: Two times a day (BID) | ORAL | 0 refills | Status: DC | PRN

## 2018-04-08 NOTE — Progress Notes (Signed)
BP (!) 115/6   Pulse 71   Temp (!) 97.2 F (36.2 C) (Oral)   Ht 5\' 11"  (1.803 m)   Wt 280 lb 3.2 oz (127.1 kg)   BMI 39.08 kg/m    Subjective:    Patient ID: Carl [name], male    DOB: 08/23/1975, 43 y.o.   MRN: [id number]  HPI: Carl [name] is a 43 y.o. male presenting on 04/08/2018 for Diabetes (3 month follow up); Hypertension; and ER follow up [facility] Sunday and was dx with flu)   HPI Type 2 diabetes mellitus Patient comes in today for recheck of his diabetes. Patient has been currently taking Jentadueto. Patient is currently on an ACE inhibitor/ARB. Patient has not seen an ophthalmologist this year. Patient denies any issues with their feet.   Hypertension Patient is currently on lisinopril and Toprol, and their blood pressure today is 115/76. Patient denies any lightheadedness or dizziness. Patient denies headaches, blurred vision, chest pains, shortness of breath, or weakness. Denies any side effects from medication and is content with current medication.   Hyperlipidemia Patient is coming in for recheck of his hyperlipidemia. The patient is currently taking atorvastatin. They deny any issues with myalgias or history of liver damage from it. They deny any focal numbness or weakness or chest pain.   Patient is coming in today also complaining of some cough and congestion is been going on over the past couple weeks.  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  He says mainly he does still has some cough and chest congestion and that is the main thing he wants to see if we can help with today.  He denies any further fevers or chills.  Relevant past medical, surgical, family and social history reviewed and updated as  indicated. Interim medical history since our last visit reviewed. Allergies and medications reviewed and updated.  Review of Systems  Constitutional: Negative for chills and fever.  HENT: Positive for congestion. Negative for sinus pressure, sinus pain, sneezing, sore throat and voice change.   Eyes: Negative for visual disturbance.  Respiratory: Positive for cough. Negative for shortness of breath and wheezing.   Cardiovascular: Negative for chest pain and leg swelling.  Musculoskeletal: Negative for back pain and gait problem.  Skin: Negative for rash.  Neurological: Negative for dizziness, weakness and light-headedness.  All other systems reviewed and are negative.   Per HPI unless specifically indicated above   Allergies as of 04/08/2018   No Known Allergies     Medication List       Accurate as of April 08, 2018 11:52 AM. Always use your most recent med list.        albuterol 108 (90 Base) MCG/ACT inhaler Commonly known as:  PROVENTIL HFA;VENTOLIN HFA Inhale 2 puffs into the lungs every 6 (six) hours as needed for wheezing or shortness of breath.   aspirin EC 81 MG tablet Take 81 mg by mouth daily.   atorvastatin 80 MG tablet Commonly known as:  LIPITOR Take 1 tablet (80 mg total) by mouth daily.   clopidogrel 75 MG tablet Commonly known as:  PLAVIX Take 1 tablet (75 mg total) by mouth daily.   linaGLIPtin-metFORMIN HCl ER 06-998  MG Tb24 Commonly known as:  JENTADUETO XR Take 1 tablet by mouth daily.   lisinopril 5 MG tablet Commonly known as:  PRINIVIL,ZESTRIL Take 1 tablet (5 mg total) by mouth daily.   methocarbamol 500 MG tablet Commonly known as:  ROBAXIN Take by mouth.   metoprolol succinate 50 MG 24 hr tablet Commonly known as:  TOPROL-XL Take 1 tablet (50 mg total) by mouth daily. Take with or immediately following a meal.   nitroGLYCERIN 0.4 MG SL tablet Commonly known as:  NITROSTAT Place 1 tablet (0.4 mg total) under the tongue every 5  (five) minutes as needed for chest pain.   TAMIFLU PO Take by mouth.   vitamin C 1000 MG tablet Take 2,000 mg by mouth daily.          Objective:    BP (!) 115/6   Pulse 71   Temp (!) 97.2 F (36.2 C) (Oral)   Ht 5\' 11"  (1.803 m)   Wt 280 lb 3.2 oz (127.1 kg)   BMI 39.08 kg/m   Wt Readings from Last 3 Encounters:  04/08/18 280 lb 3.2 oz (127.1 kg)  02/10/18 285 lb (129.3 kg)  12/24/17 292 lb 9.6 oz (132.7 kg)    Physical Exam Vitals signs and nursing note reviewed.  Constitutional:      General: He is not in acute distress.    Appearance: He is well-developed. He is not diaphoretic.  Eyes:     General: No scleral icterus.    Conjunctiva/sclera: Conjunctivae normal.  Neck:     Musculoskeletal: Neck supple.     Thyroid: No thyromegaly.  Cardiovascular:     Rate and Rhythm: Normal rate and regular rhythm.     Heart sounds: Normal heart sounds. No murmur.  Pulmonary:     Effort: Pulmonary effort is normal. No respiratory distress.     Breath sounds: Normal breath sounds. No wheezing.  Musculoskeletal: Normal range of motion.  Lymphadenopathy:     Cervical: No cervical adenopathy.  Skin:    General: Skin is warm and dry.     Findings: No rash.  Neurological:     Mental Status: He is alert and oriented to person, place, and time.     Coordination: Coordination normal.  Psychiatric:        Behavior: Behavior normal.         Assessment & Plan:   Problem List Items Addressed This Visit      Cardiovascular and Mediastinum   Hypertension associated with diabetes ([place])   Relevant Orders   Microalbumin / creatinine urine ratio     Endocrine   Hyperlipidemia associated with type 2 diabetes mellitus ([place])   Controlled diabetes mellitus type 2 with complications (HCC) - Primary   Relevant Orders   Bayer DCA Hb A1c Waived (Completed)   Microalbumin / creatinine urine ratio     Other   Morbid obesity (HCC)    Other Visit Diagnoses    Cough       Relevant  Medications   benzonatate (TESSALON) 100 MG capsule       Follow up plan: Return in about 3 months (around 07/07/2018), or if symptoms worsen or fail to improve, for Diabetes and hypertension recheck.  Counseling provided for all of the vaccine components Orders Placed This Encounter  Procedures  . Bayer DCA Hb A1c Waived  . Microalbumin / creatinine urine ratio    Caryl Pina, MD [facility] 04/08/2018, 11:52 AM

## 2018-04-09 LAB — MICROALBUMIN / CREATININE URINE RATIO
Creatinine, Urine: 57.1 mg/dL
Microalb/Creat Ratio: 41 mg/g creat — ABNORMAL HIGH (ref 0–29)
Microalbumin, Urine: 23.5 ug/mL

## 2018-05-26 ENCOUNTER — Telehealth: Payer: Self-pay | Admitting: Family Medicine

## 2018-05-26 ENCOUNTER — Ambulatory Visit (INDEPENDENT_AMBULATORY_CARE_PROVIDER_SITE_OTHER): Payer: BLUE CROSS/BLUE SHIELD | Admitting: Family Medicine

## 2018-05-26 ENCOUNTER — Other Ambulatory Visit: Payer: Self-pay

## 2018-05-26 DIAGNOSIS — G8929 Other chronic pain: Secondary | ICD-10-CM

## 2018-05-26 DIAGNOSIS — M545 Low back pain: Secondary | ICD-10-CM

## 2018-05-26 DIAGNOSIS — M62830 Muscle spasm of back: Secondary | ICD-10-CM | POA: Diagnosis not present

## 2018-05-26 MED ORDER — PREDNISONE 10 MG (21) PO TBPK: ORAL_TABLET | ORAL | 0 refills | Status: DC

## 2018-05-26 MED ORDER — METHOCARBAMOL 500 MG PO TABS: 500.0000 mg | ORAL_TABLET | Freq: Three times a day (TID) | ORAL | 0 refills | Status: DC | PRN

## 2018-05-26 NOTE — Telephone Encounter (Signed)
Patient did phone call visit with Dr. Darnell Level- please advise and send back to pools.

## 2018-05-26 NOTE — Telephone Encounter (Signed)
Gina to refax

## 2018-05-26 NOTE — Progress Notes (Signed)
Telephone visit  Subjective: CC: back pain PCP: Dettinger, Fransisca Kaufmann, MD UMP:NTIRW R Housholder is a 43 y.o. male calls for telephone consult today. Patient provides verbal consent for consult held via phone.  Location of patient: home Location of provider: [facility] Others present for call: friend  1. Back pain  Patient reports onset of back spasm Sunday evening.  He notes that it was slightly better up until yesterday and today when it got significantly worse.  He notes pain with moving, particularly rolling around in bed and getting out of bed.  He denies any lower extremity weakness, numbness, tingling, saddle anesthesia, fecal incontinence or urinary retention.  He does report some radiation to the testicles but denies any gross swelling or discoloration of the testicles.  He is used lidocaine patches, heating pads and ibuprofen 400 mg but is try to use therapies other than NSAIDs given her heart history.  He has missed work and is also asking for a work excuse.   ROS: Per HPI  No Known Allergies Past Medical History:  Diagnosis Date  . Anxiety   . CAD in native artery    a. cath in 2015 [facility] showing occluded proximal LAD, LAD filled with left-left and right-left collaterals, otherwise 20-30% prox-mid RCA, LVEF 55%. b. Abnormal nuc 02/2016 - [facility]md medically.  . Daily headache    "since I started Plavix" (08/25/2017)  . Depression   . Diabetes mellitus type 2 in obese ([place])   . Hyperlipidemia   . Hypertension   . OSA on CPAP     Current Outpatient Medications:  .  albuterol (PROVENTIL HFA;VENTOLIN HFA) 108 (90 Base) MCG/ACT inhaler, Inhale 2 puffs into the lungs every 6 (six) hours as needed for wheezing or shortness of breath. (Patient taking differently: Inhale 1-2 puffs into the lungs every 6 (six) hours as needed for wheezing or shortness of breath. ), Disp: 1 Inhaler, Rfl: 0 .  Ascorbic Acid (VITAMIN C) 1000 MG tablet, Take 2,000 mg by mouth daily., Disp: , Rfl:  .  aspirin EC 81 MG  tablet, Take 81 mg by mouth daily. , Disp: , Rfl:  .  atorvastatin (LIPITOR) 80 MG tablet, Take 1 tablet (80 mg total) by mouth daily., Disp: 90 tablet, Rfl: 3 .  benzonatate (TESSALON) 100 MG capsule, Take 1 capsule (100 mg total) by mouth 2 (two) times daily as needed for cough., Disp: 20 capsule, Rfl: 0 .  clopidogrel (PLAVIX) 75 MG tablet, Take 1 tablet (75 mg total) by mouth daily., Disp: 90 tablet, Rfl: 3 .  linaGLIPtin-metFORMIN HCl ER (JENTADUETO XR) 06-998 MG TB24, Take 1 tablet by mouth daily., Disp: 90 tablet, Rfl: 3 .  lisinopril (PRINIVIL,ZESTRIL) 5 MG tablet, Take 1 tablet (5 mg total) by mouth daily., Disp: 90 tablet, Rfl: 1 .  methocarbamol (ROBAXIN) 500 MG tablet, Take by mouth., Disp: , Rfl:  .  metoprolol succinate (TOPROL-XL) 50 MG 24 hr tablet, Take 1 tablet (50 mg total) by mouth daily. Take with or immediately following a meal., Disp: 90 tablet, Rfl: 3 .  nitroGLYCERIN (NITROSTAT) 0.4 MG SL tablet, Place 1 tablet (0.4 mg total) under the tongue every 5 (five) minutes as needed for chest pain., Disp: 25 tablet, Rfl: 3 .  Oseltamivir Phosphate (TAMIFLU PO), Take by mouth., Disp: , Rfl:   Assessment/ Plan: 43 y.o. male   1. Chronic bilateral low back pain without sciatica Acute on chronic bilateral low back pain.  No red flag signs or symptoms.  I reviewed  his last A1c which was 7.1.  This was a slight increase from his previously controlled type 2 diabetes.  At this point, given his heart history I think use of oral steroids outweighs the risks of use of NSAIDs and possible drug-induced hyperglycemia.  I have prescribed him a steroid Dosepak to start tomorrow morning.  I have also renewed his Robaxin for low back spasm.  I encouraged him to keep mobile and keep the muscles warm to reduce spasm.  We discussed reasons for emergent evaluation the emergency department.  I have also provided him a work note and faxed to the number he provided: [phone].  2. Lumbar paraspinal  muscle spasm As above   Start time: 4:02pm End time: 4:13pm  Total time spent on patient care (including telephone call/ virtual visit): 15 minutes  [address], [place] [phone]

## 2018-06-01 ENCOUNTER — Other Ambulatory Visit: Payer: Self-pay | Admitting: Cardiology

## 2018-06-07 ENCOUNTER — Ambulatory Visit (INDEPENDENT_AMBULATORY_CARE_PROVIDER_SITE_OTHER): Payer: BC Managed Care – PPO | Admitting: Family Medicine

## 2018-06-07 ENCOUNTER — Other Ambulatory Visit: Payer: Self-pay

## 2018-06-07 DIAGNOSIS — G8929 Other chronic pain: Secondary | ICD-10-CM | POA: Diagnosis not present

## 2018-06-07 DIAGNOSIS — M545 Low back pain, unspecified: Secondary | ICD-10-CM

## 2018-06-07 MED ORDER — METHOCARBAMOL 500 MG PO TABS: 500.0000 mg | ORAL_TABLET | Freq: Three times a day (TID) | ORAL | 0 refills | Status: DC | PRN

## 2018-06-07 NOTE — Progress Notes (Signed)
Telephone visit  Subjective: CC:low back pain PCP: Dettinger, Fransisca Kaufmann, MD BTD:VVOHY R Applegate is a 43 y.o. male calls for telephone consult today. Patient provides verbal consent for consult held via phone.  Location of patient: home Location of provider: [facility] Others present for call: none  1.  Low back pain Patient was evaluated via telephone visit on 05/26/2022 chronic low back pain without sciatica.  Pain was described as radiating to the groin region but he had no lower extremity weakness, numbness tingling.  No red flag symptoms including saddle anesthesia, fecal incontinence or urinary retention. He was prescribed a prednisone Dosepak and Robaxin.  He states that pain did seem to get somewhat better after these therapies but returned after he worked yesterday.  Today, pain is 4 out of 10.  However last evening after he worked pain was 7 out of 10 and interfered with his ability to function.  He wants to proceed with orthopedic evaluation.  ROS: Per HPI  No Known Allergies Past Medical History:  Diagnosis Date  . Anxiety   . CAD in native artery    a. cath in 2015 [facility] showing occluded proximal LAD, LAD filled with left-left and right-left collaterals, otherwise 20-30% prox-mid RCA, LVEF 55%. b. Abnormal nuc 02/2016 - mgmd medically.  . Daily headache    "since I started Plavix" (08/25/2017)  . Depression   . Diabetes mellitus type 2 in obese ([place])   . Hyperlipidemia   . Hypertension   . OSA on CPAP     Current Outpatient Medications:  .  albuterol (PROVENTIL HFA;VENTOLIN HFA) 108 (90 Base) MCG/ACT inhaler, Inhale 2 puffs into the lungs every 6 (six) hours as needed for wheezing or shortness of breath. (Patient taking differently: Inhale 1-2 puffs into the lungs every 6 (six) hours as needed for wheezing or shortness of breath. ), Disp: 1 Inhaler, Rfl: 0 .  Ascorbic Acid (VITAMIN C) 1000 MG tablet, Take 2,000 mg by mouth daily., Disp: , Rfl:  .  aspirin EC 81 MG tablet, Take 81 mg  by mouth daily. , Disp: , Rfl:  .  atorvastatin (LIPITOR) 80 MG tablet, Take 1 tablet (80 mg total) by mouth daily., Disp: 90 tablet, Rfl: 3 .  clopidogrel (PLAVIX) 75 MG tablet, Take 1 tablet (75 mg total) by mouth daily., Disp: 90 tablet, Rfl: 3 .  linaGLIPtin-metFORMIN HCl ER (JENTADUETO XR) 06-998 MG TB24, Take 1 tablet by mouth daily., Disp: 90 tablet, Rfl: 3 .  lisinopril (PRINIVIL,ZESTRIL) 5 MG tablet, TAKE 1 TABLET BY MOUTH DAILY, Disp: 90 tablet, Rfl: 1 .  methocarbamol (ROBAXIN) 500 MG tablet, Take 1 tablet (500 mg total) by mouth every 8 (eight) hours as needed for muscle spasms., Disp: 30 tablet, Rfl: 0 .  metoprolol succinate (TOPROL-XL) 50 MG 24 hr tablet, Take 1 tablet (50 mg total) by mouth daily. Take with or immediately following a meal., Disp: 90 tablet, Rfl: 3 .  nitroGLYCERIN (NITROSTAT) 0.4 MG SL tablet, Place 1 tablet (0.4 mg total) under the tongue every 5 (five) minutes as needed for chest pain., Disp: 25 tablet, Rfl: 3  Assessment/ Plan: 43 y.o. male   1. Chronic bilateral low back pain without sciatica I reviewed his imaging study from 2015.  At that time, there were no findings suggestive of disc abnormality or degenerative changes.back pain ongoing despite use of prednisone Dosepak and muscle relaxer.  I will place referral to orthopedics per his request.  He requests first available.  Will write note  excusing through until he is evaluated by specialty.  We discussed red flag signs symptoms again and he voiced good understanding.  Refill Robaxin. - Ambulatory referral to Orthopedic Surgery  Meds ordered this encounter  Medications  . methocarbamol (ROBAXIN) 500 MG tablet    Sig: Take 1 tablet (500 mg total) by mouth every 8 (eight) hours as needed for muscle spasms.    Dispense:  30 tablet    Refill:  0   Start time: 9:04am End time: 9:11am  Total time spent on patient care (including telephone call/ virtual visit): 15 minutes  [place], [place] [phone]

## 2018-07-08 ENCOUNTER — Ambulatory Visit: Payer: BLUE CROSS/BLUE SHIELD | Admitting: Family Medicine

## 2018-07-25 ENCOUNTER — Other Ambulatory Visit: Payer: Self-pay | Admitting: Cardiology

## 2018-08-26 ENCOUNTER — Other Ambulatory Visit: Payer: Self-pay

## 2018-08-26 ENCOUNTER — Ambulatory Visit: Payer: BC Managed Care – PPO | Admitting: Family Medicine

## 2018-08-26 ENCOUNTER — Encounter: Payer: Self-pay | Admitting: Family Medicine

## 2018-08-26 VITALS — BP 140/83 | HR 94 | Temp 97.0°F | Ht 71.0 in | Wt 289.2 lb

## 2018-08-26 DIAGNOSIS — E1159 Type 2 diabetes mellitus with other circulatory complications: Secondary | ICD-10-CM

## 2018-08-26 DIAGNOSIS — E785 Hyperlipidemia, unspecified: Secondary | ICD-10-CM

## 2018-08-26 DIAGNOSIS — E118 Type 2 diabetes mellitus with unspecified complications: Secondary | ICD-10-CM | POA: Diagnosis not present

## 2018-08-26 DIAGNOSIS — E1169 Type 2 diabetes mellitus with other specified complication: Secondary | ICD-10-CM | POA: Diagnosis not present

## 2018-08-26 DIAGNOSIS — G4733 Obstructive sleep apnea (adult) (pediatric): Secondary | ICD-10-CM

## 2018-08-26 DIAGNOSIS — I10 Essential (primary) hypertension: Secondary | ICD-10-CM

## 2018-08-26 NOTE — Progress Notes (Signed)
BP 140/83   Pulse 94   Temp (!) 97 F (36.1 C) (Oral)   Ht _0  (1.803 m)   Wt 289 lb 3.2 oz (131.2 kg)   BMI 40.34 kg/m    Subjective:   Patient ID: Carl [name], male    DOB: [birth year], 43 y.o.   MRN: [id number]  HPI: Carl [name] is a 43 y.o. 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  He has a sleep apnea machine and he takes it on the road with him and he uses it 95% of the nighttime and he says he gets a very much more refreshing sleep with it.  Type 2 diabetes mellitus Patient comes in today for recheck of his diabetes. Patient has been currently taking Jentadueto. Patient is currently on an ACE inhibitor/ARB. Patient has not seen an ophthalmologist this year. Patient denies any issues with their feet.   Hypertension Patient is currently on lisinopril and metoprolol, and their blood pressure today is 140/83. Patient denies any lightheadedness or dizziness. Patient denies headaches, blurred vision, chest pains, shortness of breath, or weakness. Denies any side effects from medication and is content with current medication.   Hyperlipidemia Patient is coming in for recheck of his hyperlipidemia. The patient is currently taking lovastatin. They deny any issues with myalgias or history of liver damage from it. They deny any focal numbness or weakness or chest pain.   Relevant past medical, surgical, family and social history reviewed and updated as indicated. Interim medical history since our last visit reviewed. Allergies and medications reviewed and updated.  Review of Systems  Constitutional: Negative for chills and fever.  Eyes: Negative for visual disturbance.  Respiratory: Negative for shortness of breath and wheezing.   Cardiovascular: Negative for chest pain and leg swelling.  Musculoskeletal: Negative for back pain and gait problem.  Skin: Negative for  rash.  Neurological: Negative for dizziness, weakness and light-headedness.  Psychiatric/Behavioral: Negative for dysphoric mood, self-injury, sleep disturbance and suicidal ideas. The patient is not nervous/anxious.   All other systems reviewed and are negative.   Per HPI unless specifically indicated above   Allergies as of 08/26/2018   No Known Allergies     Medication List       Accurate as of August 26, 2018  1:16 PM. If you have any questions, ask your nurse or doctor.        STOP taking these medications   clopidogrel 75 MG tablet Commonly known as: PLAVIX Stopped by: Fransisca Kaufmann , MD   methocarbamol 500 MG tablet Commonly known as: ROBAXIN Stopped by: Worthy Rancher, MD     TAKE these medications   albuterol 108 (90 Base) MCG/ACT inhaler Commonly known as: VENTOLIN HFA Inhale 2 puffs into the lungs every 6 (six) hours as needed for wheezing or shortness of breath. What changed: how much to take   aspirin EC 81 MG tablet Take 81 mg by mouth daily.   atorvastatin 80 MG tablet Commonly known as: LIPITOR Take 1 tablet (80 mg total) by mouth daily.   linaGLIPtin-metFORMIN HCl ER 06-998 MG Tb24 Commonly known as: Jentadueto XR Take 1 tablet by mouth daily.   lisinopril 5 MG tablet Commonly known as: ZESTRIL TAKE 1 TABLET BY MOUTH DAILY   metoprolol succinate 50 MG 24 hr tablet Commonly known as: TOPROL-XL Take 1 tablet (50 mg total) by  mouth daily. Take with or immediately following a meal.   nitroGLYCERIN 0.4 MG SL tablet Commonly known as: NITROSTAT Place 1 tablet (0.4 mg total) under the tongue every 5 (five) minutes as needed for chest pain.   vitamin C 1000 MG tablet Take 2,000 mg by mouth daily.        Objective:   BP 140/83   Pulse 94   Temp (!) 97 F (36.1 C) (Oral)   Ht _0  (1.803 m)   Wt 289 lb 3.2 oz (131.2 kg)   BMI 40.34 kg/m   Wt Readings from Last 3 Encounters:  08/26/18 289 lb 3.2 oz (131.2 kg)  04/08/18 280 lb  3.2 oz (127.1 kg)  02/10/18 285 lb (129.3 kg)    Physical Exam Vitals signs and nursing note reviewed.  Constitutional:      General: He is not in acute distress.    Appearance: He is well-developed. He is not diaphoretic.  Eyes:     General: No scleral icterus.    Conjunctiva/sclera: Conjunctivae normal.  Neck:     Musculoskeletal: Neck supple.     Thyroid: No thyromegaly.  Cardiovascular:     Rate and Rhythm: Normal rate and regular rhythm.     Heart sounds: Normal heart sounds. No murmur.  Pulmonary:     Effort: Pulmonary effort is normal. No respiratory distress.     Breath sounds: Normal breath sounds. No wheezing.  Musculoskeletal: Normal range of motion.  Lymphadenopathy:     Cervical: No cervical adenopathy.  Skin:    General: Skin is warm and dry.     Findings: No rash.  Neurological:     Mental Status: He is alert and oriented to person, place, and time.     Coordination: Coordination normal.  Psychiatric:        Behavior: Behavior normal.      Assessment & Plan:   Problem List Items Addressed This Visit      Cardiovascular and Mediastinum   Hypertension associated with diabetes ([place])   Relevant Orders   CMP14+EGFR     Respiratory   OSA (obstructive sleep apnea)   Relevant Orders   For home use only DME Other see comment     Endocrine   Hyperlipidemia associated with type 2 diabetes mellitus ([place])   Relevant Orders   Lipid panel   Controlled diabetes mellitus type 2 with complications ([place]) - Primary   Relevant Orders   Bayer DCA Hb A1c Waived (Completed)   CBC with Differential/Platelet     Other   Morbid obesity ([facility])      Continue current medications for diabetes and continue CPAP and gave prescription for CPAP  Continue metoprolol and lisinopril and atorvastatin Follow up plan: Return in about 3 months (around 11/26/2018), or if symptoms worsen or fail to improve, for Recheck diabetes and hypertension.  Counseling provided for all of  the vaccine components No orders of the defined types were placed in this encounter.   Caryl Pina, MD [facility] 08/26/2018, 1:16 PM

## 2018-08-31 ENCOUNTER — Other Ambulatory Visit: Payer: Self-pay

## 2018-08-31 ENCOUNTER — Other Ambulatory Visit: Payer: BC Managed Care – PPO

## 2018-08-31 DIAGNOSIS — E1159 Type 2 diabetes mellitus with other circulatory complications: Secondary | ICD-10-CM

## 2018-08-31 DIAGNOSIS — E118 Type 2 diabetes mellitus with unspecified complications: Secondary | ICD-10-CM

## 2018-08-31 DIAGNOSIS — E1169 Type 2 diabetes mellitus with other specified complication: Secondary | ICD-10-CM

## 2018-08-31 DIAGNOSIS — E785 Hyperlipidemia, unspecified: Secondary | ICD-10-CM

## 2018-08-31 LAB — BAYER DCA HB A1C WAIVED: HB A1C (BAYER DCA - WAIVED): 7 % — ABNORMAL HIGH

## 2018-09-01 LAB — LIPID PANEL
Chol/HDL Ratio: 4.5 ratio (ref 0.0–5.0)
Cholesterol, Total: 109 mg/dL (ref 100–199)
HDL: 24 mg/dL — ABNORMAL LOW (ref >39)
LDL Calculated: 56 mg/dL (ref 0–99)
Triglycerides: 146 mg/dL (ref 0–149)
VLDL Cholesterol Cal: 29 mg/dL (ref 5–40)

## 2018-09-01 LAB — CBC WITH DIFFERENTIAL/PLATELET
Basophils Absolute: 0 10*3/uL (ref 0.0–0.2)
Basos: 0 %
EOS (ABSOLUTE): 0.2 10*3/uL (ref 0.0–0.4)
Eos: 3 %
Hematocrit: 40.9 % (ref 37.5–51.0)
Hemoglobin: 14.1 g/dL (ref 13.0–17.7)
Immature Grans (Abs): 0 10*3/uL (ref 0.0–0.1)
Immature Granulocytes: 0 %
Lymphocytes Absolute: 2.6 10*3/uL (ref 0.7–3.1)
Lymphs: 33 %
MCH: 31 pg (ref 26.6–33.0)
MCHC: 34.5 g/dL (ref 31.5–35.7)
MCV: 90 fL (ref 79–97)
Monocytes Absolute: 0.9 10*3/uL (ref 0.1–0.9)
Monocytes: 11 %
Neutrophils Absolute: 4.3 10*3/uL (ref 1.4–7.0)
Neutrophils: 53 %
Platelets: 200 10*3/uL (ref 150–450)
RBC: 4.55 x10E6/uL (ref 4.14–5.80)
RDW: 11.8 % (ref 11.6–15.4)
WBC: 8 10*3/uL (ref 3.4–10.8)

## 2018-09-01 LAB — CMP14+EGFR
ALT: 18 IU/L (ref 0–44)
AST: 14 IU/L (ref 0–40)
Albumin/Globulin Ratio: 1.6 (ref 1.2–2.2)
Albumin: 4.2 g/dL (ref 4.0–5.0)
Alkaline Phosphatase: 64 IU/L (ref 39–117)
BUN/Creatinine Ratio: 14 (ref 9–20)
BUN: 15 mg/dL (ref 6–24)
Bilirubin Total: 0.5 mg/dL (ref 0.0–1.2)
CO2: 21 mmol/L (ref 20–29)
Calcium: 9 mg/dL (ref 8.7–10.2)
Chloride: 103 mmol/L (ref 96–106)
Creatinine, Ser: 1.06 mg/dL (ref 0.76–1.27)
GFR calc Af Amer: 98 mL/min/{1.73_m2} (ref >59)
GFR calc non Af Amer: 85 mL/min/{1.73_m2} (ref >59)
Globulin, Total: 2.7 g/dL (ref 1.5–4.5)
Glucose: 134 mg/dL — ABNORMAL HIGH (ref 65–99)
Potassium: 4.6 mmol/L (ref 3.5–5.2)
Sodium: 138 mmol/L (ref 134–144)
Total Protein: 6.9 g/dL (ref 6.0–8.5)

## 2018-09-09 ENCOUNTER — Other Ambulatory Visit: Payer: Self-pay

## 2018-09-09 ENCOUNTER — Encounter: Payer: Self-pay | Admitting: Student

## 2018-09-09 ENCOUNTER — Ambulatory Visit: Payer: BC Managed Care – PPO | Admitting: Student

## 2018-09-09 VITALS — BP 131/80 | HR 100 | Temp 97.5°F | Ht 71.0 in | Wt 290.0 lb

## 2018-09-09 DIAGNOSIS — I10 Essential (primary) hypertension: Secondary | ICD-10-CM | POA: Diagnosis not present

## 2018-09-09 DIAGNOSIS — R0789 Other chest pain: Secondary | ICD-10-CM | POA: Diagnosis not present

## 2018-09-09 DIAGNOSIS — E785 Hyperlipidemia, unspecified: Secondary | ICD-10-CM | POA: Diagnosis not present

## 2018-09-09 DIAGNOSIS — Z720 Tobacco use: Secondary | ICD-10-CM

## 2018-09-09 DIAGNOSIS — I2510 Atherosclerotic heart disease of native coronary artery without angina pectoris: Secondary | ICD-10-CM | POA: Diagnosis not present

## 2018-09-09 DIAGNOSIS — R079 Chest pain, unspecified: Secondary | ICD-10-CM

## 2018-09-09 MED ORDER — ATORVASTATIN CALCIUM 80 MG PO TABS: 80.0000 mg | ORAL_TABLET | Freq: Every day | ORAL | 3 refills | Status: DC

## 2018-09-09 MED ORDER — METOPROLOL SUCCINATE ER 50 MG PO TB24: 50.0000 mg | ORAL_TABLET | Freq: Every day | ORAL | 3 refills | Status: DC

## 2018-09-09 MED ORDER — LISINOPRIL 5 MG PO TABS: 5.0000 mg | ORAL_TABLET | Freq: Every day | ORAL | 1 refills | Status: DC

## 2018-09-09 NOTE — Progress Notes (Signed)
Cardiology Office Note    Date:  09/09/2018   ID:  Carl [name], DOB 02-Jun-1975, MRN [id number]  PCP:  Dettinger, Fransisca Kaufmann, MD  Cardiologist: Carlyle Dolly, MD    Chief Complaint  Patient presents with  . Follow-up    Overdue Visit    History of Present Illness:    Carl [name] is a 43 y.o. male with past medical history of CAD (s/p cath in 2015 showing occluded proximal LAD, redo cath in 06/2017 showing 100% Prox LAD stenosis, 50% RCA, and 40% RPDA and underwent CTO PCI of the LAD with DESx2 to in 08/2017), HTN, HLD, Type 2 DM, and OSA who presents to the office today for overdue follow-up.  He was last examined by Dr. Harl Bowie in 11/2017 and denied any recent chest pain or dyspnea on exertion. Was walking on the treadmill for 10 to 15-minute intervals without any anginal symptoms. Lisinopril was restarted given elevated BP and he was continued on ASA, Lipitor, Plavix, and Toprol-XL.  In talking with the patient today, he reports overall doing well since his last office visit. He was previously exercising and had actually signed up for a [employer] but has been unable to exercise regularly due to the gym having closed given COVID-19. 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 He denies any recent exertional chest pain but does report occasional episodes of a shooting pain along his sternum which can last for a few seconds up to a minute and then spontaneously resolves. This typically occurs with rest or when sitting in his truck. No associated nausea, vomiting, or dyspnea. He denies any recent dyspnea on exertion, orthopnea, PND, or lower extremity edema.  He does continue to smoke 2 packs of cigarettes per day and is very vocal in his decision that he did has no intention of quitting.  Past Medical History:  Diagnosis Date  . Anxiety   . CAD in native artery    a. cath in 2015 [facility] showing occluded proximal LAD, LAD filled with  left-left and right-left collaterals, otherwise 20-30% prox-mid RCA, LVEF 55%. b. Abnormal nuc 02/2016 - [facility]md medically. c. redo cath in 06/2017 showing 100% Prox LAD stenosis, 50% RCA, and 40% RPDA and underwent CTO PCI of the LAD with DESx2 to in 08/2017  . Daily headache    "since I started Plavix" (08/25/2017)  . Depression   . Diabetes mellitus type 2 in obese ([place])   . Hyperlipidemia   . Hypertension   . OSA on CPAP     Past Surgical History:  Procedure Laterality Date  . CORONARY CTO INTERVENTION N/A 08/25/2017   Procedure: CORONARY CTO INTERVENTION;  Surgeon: Martinique, Peter M, MD;  Location: [facility];  Service: Cardiovascular;  Laterality: N/A;  . LEFT HEART CATH AND CORONARY ANGIOGRAPHY N/A 07/21/2017   Procedure: LEFT HEART CATH AND CORONARY ANGIOGRAPHY;  Surgeon: Belva Crome, MD;  Location: [facility];  Service: Cardiovascular;  Laterality: N/A;  . MOLE REMOVAL Right    "chin"  . TUMOR EXCISION Left 1991   knee area    Current Medications: Outpatient Medications Prior to Visit  Medication Sig Dispense Refill  . albuterol (PROVENTIL HFA;VENTOLIN HFA) 108 (90 Base) MCG/ACT inhaler Inhale 2 puffs into the lungs every 6 (six) hours as needed for wheezing or shortness of breath. (Patient taking differently: Inhale 1-2 puffs into the lungs every 6 (six) hours as needed for wheezing  or shortness of breath. ) 1 Inhaler 0  . Ascorbic Acid (VITAMIN C) 1000 MG tablet Take 2,000 mg by mouth daily.    Marland Kitchen aspirin EC 81 MG tablet Take 81 mg by mouth daily.     Marland Kitchen linaGLIPtin-metFORMIN HCl ER (JENTADUETO XR) 06-998 MG TB24 Take 1 tablet by mouth daily. 90 tablet 3  . nitroGLYCERIN (NITROSTAT) 0.4 MG SL tablet Place 1 tablet (0.4 mg total) under the tongue every 5 (five) minutes as needed for chest pain. 25 tablet 3  . atorvastatin (LIPITOR) 80 MG tablet Take 1 tablet (80 mg total) by mouth daily. 90 tablet 3  . lisinopril (PRINIVIL,ZESTRIL) 5 MG tablet TAKE 1 TABLET BY MOUTH  DAILY 90 tablet 1  . metoprolol succinate (TOPROL-XL) 50 MG 24 hr tablet Take 1 tablet (50 mg total) by mouth daily. Take with or immediately following a meal. 90 tablet 3   No facility-administered medications prior to visit.      Allergies:   Patient has no known allergies.   Social History   Socioeconomic History  . Marital status: Divorced    Spouse name: Not on file  . Number of children: Not on file  . Years of education: Not on file  . Highest education level: Not on file  Occupational History  . Not on file  Social Needs  . Financial resource strain: Not on file  . Food insecurity    Worry: Not on file    Inability: Not on file  . Transportation needs    Medical: Not on file    Non-medical: Not on file  Tobacco Use  . Smoking status: Current Every Day Smoker    Packs/day: 2.00    Years: 24.00    Pack years: 48.00    Types: Cigarettes    Start date: 01/03/1993  . Smokeless tobacco: Never Used  Substance and Sexual Activity  . Alcohol use: Yes    Comment: 08/25/2017 "0-a few drinks/year"  . Drug use: Never  . Sexual activity: Yes  Lifestyle  . Physical activity    Days per week: Not on file    Minutes per session: Not on file  . Stress: Not on file  Relationships  . Social Herbalist on phone: Not on file    Gets together: Not on file    Attends religious service: Not on file    Active member of club or organization: Not on file    Attends meetings of clubs or organizations: Not on file    Relationship status: Not on file  Other Topics Concern  . Not on file  Social History Narrative  . Not on file     Family History:  The patient's family history includes Diabetes in his father and mother.   Review of Systems:   Please see the history of present illness.     General:  No chills, fever, night sweats or weight changes.  Cardiovascular:  No dyspnea on exertion, edema, orthopnea, palpitations, paroxysmal nocturnal dyspnea. Positive for chest  pain.  Dermatological: No rash, lesions/masses Respiratory: No cough, dyspnea Urologic: No hematuria, dysuria Abdominal:   No nausea, vomiting, diarrhea, bright red blood per rectum, melena, or hematemesis Neurologic:  No visual changes, wkns, changes in mental status. All other systems reviewed and are otherwise negative except as noted above.   Physical Exam:    VS:  BP 131/80   Pulse 100   Temp (!) 97.5 F (36.4 C)   Ht 5'  11" (1.803 m)   Wt 290 lb (131.5 kg)   BMI 40.45 kg/m    General: Well developed, well nourished,male appearing in no acute distress. Head: Normocephalic, atraumatic, sclera non-icteric, no xanthomas, nares are without discharge.  Neck: No carotid bruits. JVD not elevated.  Lungs: Respirations regular and unlabored, without wheezes or rales.  Heart: Regular rate and rhythm. No S3 or S4.  No murmur, no rubs, or gallops appreciated. Abdomen: Soft, non-tender, non-distended with normoactive bowel sounds. No hepatomegaly. No rebound/guarding. No obvious abdominal masses. Msk:  Strength and tone appear normal for age. No joint deformities or effusions. Extremities: No clubbing or cyanosis. No lower extremity edema.  Distal pedal pulses are 2+ bilaterally. Neuro: Alert and oriented X 3. Moves all extremities spontaneously. No focal deficits noted. Psych:  Responds to questions appropriately with a normal affect. Skin: No rashes or lesions noted  Wt Readings from Last 3 Encounters:  09/09/18 290 lb (131.5 kg)  08/26/18 289 lb 3.2 oz (131.2 kg)  04/08/18 280 lb 3.2 oz (127.1 kg)     Studies/Labs Reviewed:   EKG:  EKG is ordered today. The ekg ordered today demonstrates sinus tachycardia, HR 101, with no acute ST changes when compared to prior tracings.   Recent Labs: 08/31/2018: ALT 18; BUN 15; Creatinine, Ser 1.06; Hemoglobin 14.1; Platelets 200; Potassium 4.6; Sodium 138   Lipid Panel    Component Value Date/Time   CHOL 109 08/31/2018 0917   TRIG 146  08/31/2018 0917   HDL 24 (L) 08/31/2018 0917   CHOLHDL 4.5 08/31/2018 0917   LDLCALC 56 08/31/2018 0917    Additional studies/ records that were reviewed today include:   Coronary Intervention: 08/2017  Prox LAD lesion is 100% stenosed.  A drug-eluting stent was successfully placed using a STENT SYNERGY DES 2.5X38.  A drug-eluting stent was successfully placed using a STENT SYNERGY DES 2.5X16.  Post intervention, there is a 0% residual stenosis.  Ost 1st Diag to 1st Diag lesion is 100% stenosed.   1. Successful CTO PCI of the LAD with DES x 2.  Recommend uninterrupted dual antiplatelet therapy with Aspirin 81mg  daily and Clopidogrel 75mg  daily for a minimum of 12 months (ACS - Class I recommendation).   Cardiac Catheterization: 06/2017  Chronic total occlusion of the proximal to mid LAD after the first septal perforator.  Left to right and right to left collaterals are noted.  Widely patent left main  Normal circumflex coronary artery  Moderately diseased right coronary with eccentric 50 to 60% proximal to mid lesion 40% eccentric distal lesion and 40% ostial PDA stenosis.  Abnormal left ventricular function with anteroapical hypokinesis.  EF 45 to 50%.  Normal filling pressures.  RECOMMENDATIONS:   Aggressive risk factor modification: Discussed smoking cessation, better control of diabetes, diet, management of sleep apnea.  Consider for CTO LAD.  Will refer the patient to the CTO team.  Treatment considerations include CTO LAD if possible followed by FFR and possibly stenting of the RCA.  If LAD recanalization is not possible, coronary bypass grafting with LIMA to LAD and grafting of diagonal and right coronary will be an alternative consideration given impairment in LV function    Assessment:    1. Coronary artery disease involving native coronary artery of native heart without angina pectoris   2. Chest pain of uncertain etiology   3. Essential hypertension    4. Hyperlipidemia LDL goal <70   5. Tobacco abuse      Plan:  In order of problems listed above:  1. CAD/Atypical Chest Pain - he is s/p cath in 2015 showing occluded proximal LAD with redo cath in 06/2017 showing 100% Prox LAD stenosis, 50% RCA, and 40% RPDA and underwent CTO PCI of the LAD with DESx2 to in 08/2017. - he denies any exertional chest pain but has experienced occasional episodes of shooting pain along his sternum which can last for a few seconds up to a minute and then spontaneously resolves. 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 Will hold Toprol-XL the day before and morning of his test.  - he did self-discontinue Plavix earlier this month. Will verify with Dr. Harl Bowie that he wanted him to continue ASA as monotherapy instead of Plavix as this was not documented in prior notes (listed to continue DAPT until at least 08/2018). Continue statin and BB therapy.   2. HTN - BP is well controlled at 131/80 during today's visit. Continue Lisinopril 5 mg daily and Toprol-XL 50 mg daily.  3. HLD - Followed by PCP.  FLP earlier this month showed total cholesterol 109, triglycerides 146, HDL 24, and LDL 56. At goal of LDL less than 70. Continue Atorvastatin 80 mg daily.  4. Tobacco Use - he continues to smoke 2 packs of cigarettes per day and has no intention of quitting at this time. The association of nicotine in regards to his known CAD was again reviewed.    Medication Adjustments/Labs and Tests Ordered: Current medicines are reviewed at length with the patient today.  Concerns regarding medicines are outlined above.  Medication changes, Labs and Tests ordered today are listed in the Patient Instructions below. Patient Instructions  Medication Instructions: Your physician recommends that you continue on your current medications as directed. Please refer to the Current Medication list given to you  today.   Labwork: None  Procedures/Testing:  Your physician has requested that you have an exercise tolerance test. For further information please visit [email or web address]. Please also follow instruction sheet, as given.HOLD TOPROL THE DAY BEFORE AND THE DAY OF TEST  Follow-Up:  6 months with Dr.Branch unless stress test abnormal.   Any Additional Special Instructions Will Be Listed Below (If Applicable).  If you need a refill on your cardiac medications before your next appointment, please call your pharmacy.   Thank you for choosing [facility] !       Signed, Erma Heritage, PA-C  09/09/2018 5:03 PM    [facility] [address] [place], [place] [address] Phone: [phone] Fax: [phone]

## 2018-09-09 NOTE — Patient Instructions (Addendum)
Medication Instructions: Your physician recommends that you continue on your current medications as directed. Please refer to the Current Medication list given to you today.   Labwork: None  Procedures/Testing:  Your physician has requested that you have an exercise tolerance test. For further information please visit [email or web address]. Please also follow instruction sheet, as given.HOLD TOPROL THE DAY BEFORE AND THE DAY OF TEST   Follow-Up:  6 months with Dr.Branch  Any Additional Special Instructions Will Be Listed Below (If Applicable).     If you need a refill on your cardiac medications before your next appointment, please call your pharmacy.      Thank you for choosing [facility] !

## 2018-09-12 ENCOUNTER — Telehealth: Payer: Self-pay

## 2018-09-12 DIAGNOSIS — Z01818 Encounter for other preprocedural examination: Secondary | ICD-10-CM

## 2018-09-12 NOTE — Progress Notes (Signed)
ASA monotherapy is ok  Reinaldo Berber MD

## 2018-09-12 NOTE — Telephone Encounter (Signed)
Order placed for COVID 19 testing for gxt scheduled for 7/24. 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

## 2018-09-14 ENCOUNTER — Other Ambulatory Visit: Payer: Self-pay

## 2018-09-14 ENCOUNTER — Other Ambulatory Visit: Payer: BC Managed Care – PPO

## 2018-09-14 ENCOUNTER — Other Ambulatory Visit: Payer: Self-pay | Admitting: *Deleted

## 2018-09-14 DIAGNOSIS — Z01818 Encounter for other preprocedural examination: Secondary | ICD-10-CM

## 2018-09-14 DIAGNOSIS — Z20822 Contact with and (suspected) exposure to covid-19: Secondary | ICD-10-CM

## 2018-09-16 ENCOUNTER — Ambulatory Visit (HOSPITAL_COMMUNITY)
Admission: RE | Admit: 2018-09-16 | Discharge: 2018-09-16 | Disposition: A | Discharge status: Home / Self Care | Payer: BC Managed Care – PPO | Source: Ambulatory Visit | Attending: Student | Admitting: Student

## 2018-09-16 ENCOUNTER — Telehealth: Payer: Self-pay | Admitting: Student

## 2018-09-16 ENCOUNTER — Other Ambulatory Visit: Payer: Self-pay

## 2018-09-16 DIAGNOSIS — R079 Chest pain, unspecified: Secondary | ICD-10-CM

## 2018-09-16 DIAGNOSIS — R0789 Other chest pain: Secondary | ICD-10-CM | POA: Diagnosis not present

## 2018-09-16 LAB — EXERCISE TOLERANCE TEST
Estimated workload: 8.7 METS
Exercise duration (min): 6 min
Exercise duration (sec): 34 s
MPHR: 176 {beats}/min
Peak HR: 160 {beats}/min
Percent HR: 90 %
RPE: 17
Rest HR: 80 {beats}/min

## 2018-09-16 NOTE — Telephone Encounter (Signed)
Pt notified that results are not back on test but he still needs to come for stress test. Pt voiced understanding.

## 2018-09-16 NOTE — Telephone Encounter (Signed)
New message    Patient wants to know what the results are for his Covid 19 test, does he need to keep his appt for the stress test today?

## 2018-09-18 LAB — NOVEL CORONAVIRUS, NAA: SARS-CoV-2, NAA: NOT DETECTED

## 2018-09-19 ENCOUNTER — Telehealth: Payer: Self-pay | Admitting: Cardiology

## 2018-09-19 ENCOUNTER — Telehealth: Payer: Self-pay | Admitting: *Deleted

## 2018-09-19 NOTE — Telephone Encounter (Signed)
-----   Message from Erma Heritage, [place] sent at 09/16/2018  4:21 PM EDT ----- Please let the patient know that his stress test showed no significant EKG changes and was overall low risk. 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 Please forward a copy of results to Dettinger, Fransisca Kaufmann, MD.

## 2018-09-19 NOTE — Telephone Encounter (Signed)
Results given to pt, copy made for pt or son to pick up in office

## 2018-09-19 NOTE — Telephone Encounter (Signed)
Called patient with test results. No answer. Left message to call back.  

## 2018-09-19 NOTE — Telephone Encounter (Signed)
Patient is returning call for test results please use cell number as priority number

## 2018-10-12 ENCOUNTER — Other Ambulatory Visit: Payer: Self-pay | Admitting: Cardiology

## 2018-11-28 ENCOUNTER — Ambulatory Visit: Payer: BC Managed Care – PPO | Admitting: Family Medicine

## 2018-12-02 ENCOUNTER — Ambulatory Visit: Payer: BC Managed Care – PPO | Admitting: Family Medicine

## 2018-12-09 ENCOUNTER — Telehealth: Payer: Self-pay | Admitting: Cardiology

## 2018-12-09 NOTE — Telephone Encounter (Signed)
Patient returning call again. Stated someone called but did not leave message

## 2018-12-09 NOTE — Telephone Encounter (Signed)
Returning someones call 

## 2018-12-12 NOTE — Telephone Encounter (Signed)
Did not see where anyone documented calling pt.

## 2019-01-05 ENCOUNTER — Other Ambulatory Visit: Payer: Self-pay

## 2019-01-06 ENCOUNTER — Encounter: Payer: Self-pay | Admitting: Family Medicine

## 2019-01-06 ENCOUNTER — Ambulatory Visit (INDEPENDENT_AMBULATORY_CARE_PROVIDER_SITE_OTHER): Payer: BC Managed Care – PPO | Admitting: Family Medicine

## 2019-01-06 DIAGNOSIS — E1159 Type 2 diabetes mellitus with other circulatory complications: Secondary | ICD-10-CM

## 2019-01-06 DIAGNOSIS — E118 Type 2 diabetes mellitus with unspecified complications: Secondary | ICD-10-CM | POA: Diagnosis not present

## 2019-01-06 DIAGNOSIS — E1169 Type 2 diabetes mellitus with other specified complication: Secondary | ICD-10-CM | POA: Diagnosis not present

## 2019-01-06 DIAGNOSIS — E785 Hyperlipidemia, unspecified: Secondary | ICD-10-CM

## 2019-01-06 DIAGNOSIS — I10 Essential (primary) hypertension: Secondary | ICD-10-CM

## 2019-01-06 MED ORDER — JENTADUETO XR 5-1000 MG PO TB24: 1.0000 | ORAL_TABLET | Freq: Every day | ORAL | 3 refills | Status: DC

## 2019-01-06 NOTE — Progress Notes (Signed)
Virtual Visit via telephone Note  I connected with Carl [name] on 01/06/19 at [phone] by telephone and verified that I am speaking with the correct person using two identifiers. Carl [name] is currently located at home and no other people are currently with her during visit. The provider, Fransisca Kaufmann , MD is located in their office at time of visit.  Call ended at [phone]  I discussed the limitations, risks, security and privacy concerns of performing an evaluation and management service by telephone and the availability of in person appointments. I also discussed with the patient that there may be a patient responsible charge related to this service. The patient expressed understanding and agreed to proceed.   History and Present Illness: Type 2 diabetes mellitus Patient comes in today for recheck of his diabetes. Patient has been currently taking jentadu. Patient is currently on an ACE inhibitor/ARB. Patient has not seen an ophthalmologist this year. Patient deneis any issues with their feet. He feels like sugar is up because of diet   Hypertension Patient is currently on lisinopril, and their blood pressure today is unknown. Patient denies any lightheadedness or dizziness. Patient denies headaches, blurred vision, chest pains, shortness of breath, or weakness. Denies any side effects from medication and is content with current medication.   Hyperlipidemia Patient is coming in for recheck of his hyperlipidemia. The patient is currently taking atorvastatin. They deny any issues with myalgias or history of liver damage from it. They deny any focal numbness or weakness or chest pain.   No diagnosis found.  Outpatient Encounter Medications as of 01/06/2019  Medication Sig  . albuterol (PROVENTIL HFA;VENTOLIN HFA) 108 (90 Base) MCG/ACT inhaler Inhale 2 puffs into the lungs every 6 (six) hours as needed for wheezing or shortness of breath. (Patient taking differently: Inhale 1-2 puffs into  the lungs every 6 (six) hours as needed for wheezing or shortness of breath. )  . Ascorbic Acid (VITAMIN C) 1000 MG tablet Take 2,000 mg by mouth daily.  Marland Kitchen aspirin EC 81 MG tablet Take 81 mg by mouth daily.   Marland Kitchen atorvastatin (LIPITOR) 80 MG tablet Take 1 tablet (80 mg total) by mouth daily.  Marland Kitchen linaGLIPtin-metFORMIN HCl ER (JENTADUETO XR) 06-998 MG TB24 Take 1 tablet by mouth daily.  Marland Kitchen lisinopril (ZESTRIL) 5 MG tablet Take 1 tablet (5 mg total) by mouth daily.  . metoprolol succinate (TOPROL-XL) 50 MG 24 hr tablet Take 1 tablet (50 mg total) by mouth daily. Take with or immediately following a meal.  . nitroGLYCERIN (NITROSTAT) 0.4 MG SL tablet Place 1 tablet (0.4 mg total) under the tongue every 5 (five) minutes as needed for chest pain.   No facility-administered encounter medications on file as of 01/06/2019.     Review of Systems  Constitutional: Negative for chills and fever.  Eyes: Negative for visual disturbance.  Respiratory: Negative for shortness of breath and wheezing.   Cardiovascular: Negative for chest pain and leg swelling.  Musculoskeletal: Negative for back pain and gait problem.  Skin: Negative for rash.  Neurological: Negative for dizziness, weakness and light-headedness.  All other systems reviewed and are negative.   Observations/Objective: Patient sounds comfortable and in no acute distress  Assessment and Plan: Problem List Items Addressed This Visit      Cardiovascular and Mediastinum   Hypertension associated with diabetes ([place])   Relevant Medications   linaGLIPtin-metFORMIN HCl ER (JENTADUETO XR) 06-998 MG TB24     Endocrine   Hyperlipidemia associated with  type 2 diabetes mellitus (HCC)   Relevant Medications   linaGLIPtin-metFORMIN HCl ER (JENTADUETO XR) 06-998 MG TB24   Controlled diabetes mellitus type 2 with complications (HCC) - Primary   Relevant Medications   linaGLIPtin-metFORMIN HCl ER (JENTADUETO XR) 06-998 MG TB24   Other Relevant Orders    hgba1c     Other   Morbid obesity ([place])   Relevant Medications   linaGLIPtin-metFORMIN HCl ER (JENTADUETO XR) 06-998 MG TB24       Follow Up Instructions: Follow up in 3 months for diabetes. Continue jentadueto   I discussed the assessment and treatment plan with the patient. The patient was provided an opportunity to ask questions and all were answered. The patient agreed with the plan and demonstrated an understanding of the instructions.   The patient was advised to call back or seek an in-person evaluation if the symptoms worsen or if the condition fails to improve as anticipated.  The above assessment and management plan was discussed with the patient. The patient verbalized understanding of and has agreed to the management plan. Patient is aware to call the clinic if symptoms persist or worsen. Patient is aware when to return to the clinic for a follow-up visit. Patient educated on when it is appropriate to go to the emergency department.    I provided 11 minutes of non-face-to-face time during this encounter.    Worthy Rancher, MD

## 2019-03-20 ENCOUNTER — Other Ambulatory Visit: Payer: Self-pay | Admitting: Student

## 2019-04-21 ENCOUNTER — Ambulatory Visit: Payer: BC Managed Care – PPO | Admitting: Family Medicine

## 2019-06-05 ENCOUNTER — Ambulatory Visit: Payer: BC Managed Care – PPO | Admitting: Family Medicine

## 2019-06-05 ENCOUNTER — Encounter: Payer: Self-pay | Admitting: Family Medicine

## 2019-06-05 ENCOUNTER — Other Ambulatory Visit: Payer: Self-pay

## 2019-06-05 VITALS — BP 123/74 | HR 98 | Temp 98.3°F | Ht 71.0 in | Wt 292.0 lb

## 2019-06-05 DIAGNOSIS — E118 Type 2 diabetes mellitus with unspecified complications: Secondary | ICD-10-CM | POA: Diagnosis not present

## 2019-06-05 DIAGNOSIS — E1169 Type 2 diabetes mellitus with other specified complication: Secondary | ICD-10-CM | POA: Diagnosis not present

## 2019-06-05 DIAGNOSIS — E785 Hyperlipidemia, unspecified: Secondary | ICD-10-CM

## 2019-06-05 DIAGNOSIS — F419 Anxiety disorder, unspecified: Secondary | ICD-10-CM | POA: Diagnosis not present

## 2019-06-05 DIAGNOSIS — E1159 Type 2 diabetes mellitus with other circulatory complications: Secondary | ICD-10-CM

## 2019-06-05 DIAGNOSIS — I10 Essential (primary) hypertension: Secondary | ICD-10-CM

## 2019-06-05 DIAGNOSIS — F32A Depression, unspecified: Secondary | ICD-10-CM

## 2019-06-05 DIAGNOSIS — I152 Hypertension secondary to endocrine disorders: Secondary | ICD-10-CM

## 2019-06-05 DIAGNOSIS — F329 Major depressive disorder, single episode, unspecified: Secondary | ICD-10-CM

## 2019-06-05 LAB — BAYER DCA HB A1C WAIVED: HB A1C (BAYER DCA - WAIVED): 7.1 % — ABNORMAL HIGH (ref <7.0)

## 2019-06-05 MED ORDER — METFORMIN HCL ER 500 MG PO TB24: 1000.0000 mg | ORAL_TABLET | Freq: Two times a day (BID) | ORAL | 3 refills | Status: DC

## 2019-06-05 MED ORDER — OZEMPIC (0.25 OR 0.5 MG/DOSE) 2 MG/1.5ML ~~LOC~~ SOPN: 0.5000 mg | PEN_INJECTOR | SUBCUTANEOUS | 3 refills | Status: DC

## 2019-06-05 NOTE — Progress Notes (Signed)
BP 123/74   Pulse 98   Temp 98.3 F (36.8 C) (Temporal)   Ht 5\' 11"  (1.803 m)   Wt 292 lb (132.5 kg)   BMI 40.73 kg/m    Subjective:   Patient ID: Carl [name], male    DOB: [birth year], 44 y.o.   MRN: [id number]  HPI: Carl [name] is a 44 y.o. male presenting on 06/05/2019 for Medical Management of Chronic Issues   HPI Depression Patient comes in again for depression anxiety.  He says he has suicidal ideations frequently but no plans because God keeps him from doing it because he does not end up in hell.  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  He denies any actual plan to do anything currently. Depression screen [facility] 2/9 06/05/2019 08/26/2018 04/08/2018 02/10/2018 12/24/2017  Decreased Interest 3 3 0 0 3  Down, Depressed, Hopeless 3 3 0 0 3  PHQ - 2 Score 6 6 0 0 6  Altered sleeping 1 3 - - 3  Tired, decreased energy 3 3 - - 2  Change in appetite 3 3 - - 2  Feeling bad or failure about yourself  3 3 - - 3  Trouble concentrating 0 0 - - 1  Moving slowly or fidgety/restless 0 0 - - 0  Suicidal thoughts 3 0 - - 3  PHQ-9 Score 19 18 - - 20  Difficult doing work/chores Very difficult - - - -    Type 2 diabetes mellitus Patient comes in today for recheck of his diabetes. Patient has been currently taking Jentadueto, A1c 7.1 but he has heard about the new injectables like to try them to get a little bit better control.. Patient is currently on an ACE inhibitor/ARB. Patient has not seen an ophthalmologist this year. Patient denies any issues with their feet.   Hypertension Patient is currently on lisinopril and metoprolol, and their blood pressure today is 123/74. Patient denies any lightheadedness or dizziness. Patient denies headaches, blurred vision, chest pains, shortness of breath, or weakness. Denies any side effects from medication and is content with current medication.    Hyperlipidemia Patient is coming in for recheck of his hyperlipidemia. The patient is currently taking atorvastatin. They deny any issues with myalgias or history of liver damage from it. They deny any focal numbness or weakness or chest pain.   Relevant past medical, surgical, family and social history reviewed and updated as indicated. Interim medical history since our last visit reviewed. Allergies and medications reviewed and updated.  Review of Systems  Constitutional: Negative for chills and fever.  Respiratory: Negative for shortness of breath and wheezing.   Cardiovascular: Negative for chest pain and leg swelling.  Musculoskeletal: Negative for back pain and gait problem.  Skin: Negative for rash.  Neurological: Negative for dizziness, weakness and light-headedness.  Psychiatric/Behavioral: Positive for dysphoric mood and suicidal ideas. Negative for self-injury and sleep disturbance. The patient is nervous/anxious.   All other systems reviewed and are negative.   Per HPI unless specifically indicated above   Allergies as of 06/05/2019   No Known Allergies     Medication List       Accurate as of June 05, 2019  1:58 PM. If you have any questions, ask your nurse or doctor.        STOP taking these medications   albuterol 108 (90  Base) MCG/ACT inhaler Commonly known as: VENTOLIN HFA Stopped by: Fransisca Kaufmann Tylee Yum, MD   Jentadueto XR 06-998 MG Tb24 Generic drug: linaGLIPtin-metFORMIN HCl ER Stopped by: Fransisca Kaufmann Javari Bufkin, MD     TAKE these medications   aspirin EC 81 MG tablet Take 81 mg by mouth daily.   atorvastatin 80 MG tablet Commonly known as: LIPITOR Take 1 tablet (80 mg total) by mouth daily.   lisinopril 5 MG tablet Commonly known as: ZESTRIL TAKE 1 TABLET BY MOUTH EVERY DAY   metFORMIN 500 MG 24 hr tablet Commonly known as: Glucophage XR Take 2 tablets (1,000 mg total) by mouth in the morning and at bedtime. Started by: Fransisca Kaufmann Emalea Mix,  MD   metoprolol succinate 50 MG 24 hr tablet Commonly known as: TOPROL-XL Take 1 tablet (50 mg total) by mouth daily. Take with or immediately following a meal.   nitroGLYCERIN 0.4 MG SL tablet Commonly known as: NITROSTAT Place 1 tablet (0.4 mg total) under the tongue every 5 (five) minutes as needed for chest pain.   Ozempic (0.25 or 0.5 MG/DOSE) 2 MG/1.5ML Sopn Generic drug: Semaglutide(0.25 or 0.5MG /DOS) Inject 0.5 mg into the skin once a week. Started by: Worthy Rancher, MD   vitamin C 1000 MG tablet Take 2,000 mg by mouth daily.        Objective:   BP 123/74   Pulse 98   Temp 98.3 F (36.8 C) (Temporal)   Ht 5\' 11"  (1.803 m)   Wt 292 lb (132.5 kg)   BMI 40.73 kg/m   Wt Readings from Last 3 Encounters:  06/05/19 292 lb (132.5 kg)  09/09/18 290 lb (131.5 kg)  08/26/18 289 lb 3.2 oz (131.2 kg)    Physical Exam Vitals and nursing note reviewed.  Constitutional:      General: He is not in acute distress.    Appearance: He is well-developed. He is not diaphoretic.  Eyes:     General: No scleral icterus.       Right eye: No discharge.     Conjunctiva/sclera: Conjunctivae normal.     Pupils: Pupils are equal, round, and reactive to light.  Neck:     Thyroid: No thyromegaly.  Cardiovascular:     Rate and Rhythm: Normal rate and regular rhythm.     Heart sounds: Normal heart sounds. No murmur.  Pulmonary:     Effort: Pulmonary effort is normal. No respiratory distress.     Breath sounds: Normal breath sounds. No wheezing.  Musculoskeletal:        General: Normal range of motion.     Cervical back: Neck supple.  Lymphadenopathy:     Cervical: No cervical adenopathy.  Skin:    General: Skin is warm and dry.     Findings: No rash.  Neurological:     Mental Status: He is alert and oriented to person, place, and time.     Coordination: Coordination normal.  Psychiatric:        Mood and Affect: Mood is anxious and depressed.        Behavior: Behavior  normal.        Thought Content: Thought content includes suicidal ideation. Thought content does not include suicidal plan.       Assessment & Plan:   Problem List Items Addressed This Visit      Cardiovascular and Mediastinum   Hypertension associated with diabetes ([place])   Relevant Medications   metFORMIN (GLUCOPHAGE XR) 500 MG 24 hr tablet  Semaglutide,0.25 or 0.5MG /DOS, (OZEMPIC, 0.25 OR 0.5 MG/DOSE,) 2 MG/1.5ML SOPN     Endocrine   Hyperlipidemia associated with type 2 diabetes mellitus (HCC)   Relevant Medications   metFORMIN (GLUCOPHAGE XR) 500 MG 24 hr tablet   Semaglutide,0.25 or 0.5MG /DOS, (OZEMPIC, 0.25 OR 0.5 MG/DOSE,) 2 MG/1.5ML SOPN   Controlled diabetes mellitus type 2 with complications (HCC) - Primary   Relevant Medications   metFORMIN (GLUCOPHAGE XR) 500 MG 24 hr tablet   Semaglutide,0.25 or 0.5MG /DOS, (OZEMPIC, 0.25 OR 0.5 MG/DOSE,) 2 MG/1.5ML SOPN   Other Relevant Orders   Bayer DCA Hb A1c Waived     Other   Anxiety and depression      Patient has recurrent depression but 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 Follow up plan: Return in about 3 months (around 09/04/2019), or if symptoms worsen or fail to improve, for Diabetes and hypertension and cholesterol.  Counseling provided for all of the vaccine components Orders Placed This Encounter  Procedures  . [facility] Hb A1c [place], MD [facility] 06/05/2019, 1:58 PM

## 2019-06-06 LAB — CMP14+EGFR
ALT: 42 IU/L (ref 0–44)
AST: 29 IU/L (ref 0–40)
Albumin/Globulin Ratio: 1.6 (ref 1.2–2.2)
Albumin: 4.3 g/dL (ref 4.0–5.0)
Alkaline Phosphatase: 86 IU/L (ref 39–117)
BUN/Creatinine Ratio: 11 (ref 9–20)
BUN: 10 mg/dL (ref 6–24)
Bilirubin Total: 0.5 mg/dL (ref 0.0–1.2)
CO2: 19 mmol/L — ABNORMAL LOW (ref 20–29)
Calcium: 9.1 mg/dL (ref 8.7–10.2)
Chloride: 101 mmol/L (ref 96–106)
Creatinine, Ser: 0.9 mg/dL (ref 0.76–1.27)
GFR calc Af Amer: 119 mL/min/{1.73_m2} (ref >59)
GFR calc non Af Amer: 103 mL/min/{1.73_m2} (ref >59)
Globulin, Total: 2.7 g/dL (ref 1.5–4.5)
Glucose: 280 mg/dL — ABNORMAL HIGH (ref 65–99)
Potassium: 4.6 mmol/L (ref 3.5–5.2)
Sodium: 136 mmol/L (ref 134–144)
Total Protein: 7 g/dL (ref 6.0–8.5)

## 2019-06-06 LAB — CBC WITH DIFFERENTIAL/PLATELET
Basophils Absolute: 0 10*3/uL (ref 0.0–0.2)
Basos: 1 %
EOS (ABSOLUTE): 0.2 10*3/uL (ref 0.0–0.4)
Eos: 2 %
Hematocrit: 42 % (ref 37.5–51.0)
Hemoglobin: 14.9 g/dL (ref 13.0–17.7)
Immature Grans (Abs): 0 10*3/uL (ref 0.0–0.1)
Immature Granulocytes: 1 %
Lymphocytes Absolute: 2.6 10*3/uL (ref 0.7–3.1)
Lymphs: 33 %
MCH: 32.4 pg (ref 26.6–33.0)
MCHC: 35.5 g/dL (ref 31.5–35.7)
MCV: 91 fL (ref 79–97)
Monocytes Absolute: 0.7 10*3/uL (ref 0.1–0.9)
Monocytes: 9 %
Neutrophils Absolute: 4.3 10*3/uL (ref 1.4–7.0)
Neutrophils: 54 %
Platelets: 196 10*3/uL (ref 150–450)
RBC: 4.6 x10E6/uL (ref 4.14–5.80)
RDW: 12.5 % (ref 11.6–15.4)
WBC: 7.9 10*3/uL (ref 3.4–10.8)

## 2019-06-06 LAB — LIPID PANEL
Chol/HDL Ratio: 4.3 ratio (ref 0.0–5.0)
Cholesterol, Total: 116 mg/dL (ref 100–199)
HDL: 27 mg/dL — ABNORMAL LOW (ref >39)
LDL Chol Calc (NIH): 55 mg/dL (ref 0–99)
Triglycerides: 208 mg/dL — ABNORMAL HIGH (ref 0–149)
VLDL Cholesterol Cal: 34 mg/dL (ref 5–40)

## 2019-06-19 NOTE — Progress Notes (Deleted)
Cardiology Office Note    Date:  06/19/2019   ID:  Carl [name], DOB 1975/10/24, MRN [id number]  PCP:  Dettinger, Fransisca Kaufmann, MD  Cardiologist: Carlyle Dolly, MD    No chief complaint on file.   History of Present Illness:    Carl [name] is a 44 y.o. male with past medical history of CAD (s/p cath in 2015 showing occluded proximal LAD, redo cath in 06/2017 showing 100% Prox LAD stenosis, 50% RCA, and 40% RPDA and underwent CTO PCI of the LAD with DESx2 to in 08/2017), HTN, HLD, Type 2 DM, OSA and tobacco use who presents to the office today for overdue follow-up.   He was last examined by myself in 08/2018 and denied any recent chest pain or dyspnea on exertion. 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 A GXT was arranged for his DOT physical and Plavix was discontinued given he was a year out from stent placement. His stress test showed no significant EKG changes and he was cleared for his DOT physical from a cardiac perspective.     Past Medical History:  Diagnosis Date  . Anxiety   . CAD in native artery    a. cath in 2015 [facility] showing occluded proximal LAD, LAD filled with left-left and right-left collaterals, otherwise 20-30% prox-mid RCA, LVEF 55%. b. Abnormal nuc 02/2016 - [facility] medically. c. redo cath in 06/2017 showing 100% Prox LAD stenosis, 50% RCA, and 40% RPDA and underwent CTO PCI of the LAD with DESx2 to in 08/2017  . Daily headache    "since I started Plavix" (08/25/2017)  . Depression   . Diabetes mellitus type 2 in obese ([place])   . Hyperlipidemia   . Hypertension   . OSA on CPAP     Past Surgical History:  Procedure Laterality Date  . CORONARY CTO INTERVENTION N/A 08/25/2017   Procedure: CORONARY CTO INTERVENTION;  Surgeon: Martinique, Peter M, MD;  Location: [place] [facility];  Service: Cardiovascular;  Laterality: N/A;  . LEFT HEART CATH AND CORONARY ANGIOGRAPHY N/A 07/21/2017   Procedure: LEFT HEART CATH  AND CORONARY ANGIOGRAPHY;  Surgeon: Belva Crome, MD;  Location: [place];  Service: Cardiovascular;  Laterality: N/A;  . MOLE REMOVAL Right    "chin"  . TUMOR EXCISION Left 1991   knee area    Current Medications: Outpatient Medications Prior to Visit  Medication Sig Dispense Refill  . Ascorbic Acid (VITAMIN C) 1000 MG tablet Take 2,000 mg by mouth daily.    Marland Kitchen aspirin EC 81 MG tablet Take 81 mg by mouth daily.     Marland Kitchen atorvastatin (LIPITOR) 80 MG tablet Take 1 tablet (80 mg total) by mouth daily. 90 tablet 3  . lisinopril (ZESTRIL) 5 MG tablet TAKE 1 TABLET BY MOUTH EVERY DAY 30 tablet 0  . metFORMIN (GLUCOPHAGE XR) 500 MG 24 hr tablet Take 2 tablets (1,000 mg total) by mouth in the morning and at bedtime. 360 tablet 3  . metoprolol succinate (TOPROL-XL) 50 MG 24 hr tablet Take 1 tablet (50 mg total) by mouth daily. Take with or immediately following a meal. 90 tablet 3  . nitroGLYCERIN (NITROSTAT) 0.4 MG SL tablet Place 1 tablet (0.4 mg total) under the tongue every 5 (five) minutes as needed for chest pain. 25 tablet 3  . Semaglutide,0.25 or 0.5MG /DOS, (OZEMPIC, 0.25 OR 0.5 MG/DOSE,) 2 MG/1.5ML SOPN Inject 0.5 mg into the skin  once a week. 3 pen 3   No facility-administered medications prior to visit.     Allergies:   Patient has no known allergies.   Social History   Socioeconomic History  . Marital status: Divorced    Spouse name: Not on file  . Number of children: Not on file  . Years of education: Not on file  . Highest education level: Not on file  Occupational History  . Not on file  Tobacco Use  . Smoking status: Current Every Day Smoker    Packs/day: 2.00    Years: 24.00    Pack years: 48.00    Types: Cigarettes    Start date: 01/03/1993  . Smokeless tobacco: Never Used  Substance and Sexual Activity  . Alcohol use: Yes    Comment: 08/25/2017 "0-a few drinks/year"  . Drug use: Never  . Sexual activity: Yes  Other Topics Concern  . Not on file    Social History Narrative  . Not on file   Social Determinants of Health   Financial Resource Strain:   . Difficulty of Paying Living Expenses:   Food Insecurity:   . Worried About Charity fundraiser in the Last Year:   . Arboriculturist in the Last Year:   Transportation Needs:   . Film/video editor (Medical):   Marland Kitchen Lack of Transportation (Non-Medical):   Physical Activity:   . Days of Exercise per Week:   . Minutes of Exercise per Session:   Stress:   . Feeling of Stress :   Social Connections:   . Frequency of Communication with Friends and Family:   . Frequency of Social Gatherings with Friends and Family:   . Attends Religious Services:   . Active Member of Clubs or Organizations:   . Attends Archivist Meetings:   Marland Kitchen Marital Status:      Family History:  The patient's ***family history includes Cancer in his mother; Diabetes in his father and mother.   Review of Systems:   Please see the history of present illness.     General:  No chills, fever, night sweats or weight changes.  Cardiovascular:  No chest pain, dyspnea on exertion, edema, orthopnea, palpitations, paroxysmal nocturnal dyspnea. Dermatological: No rash, lesions/masses Respiratory: No cough, dyspnea Urologic: No hematuria, dysuria Abdominal:   No nausea, vomiting, diarrhea, bright red blood per rectum, melena, or hematemesis Neurologic:  No visual changes, wkns, changes in mental status. All other systems reviewed and are otherwise negative except as noted above.   Physical Exam:    VS:  There were no vitals taken for this visit.   General: Well developed, well nourished,male appearing in no acute distress. Head: Normocephalic, atraumatic, sclera non-icteric.  Neck: No carotid bruits. JVD not elevated.  Lungs: Respirations regular and unlabored, without wheezes or rales.  Heart: ***Regular rate and rhythm. No S3 or S4.  No murmur, no rubs, or gallops appreciated. Abdomen: Soft,  non-tender, non-distended. No obvious abdominal masses. Msk:  Strength and tone appear normal for age. No obvious joint deformities or effusions. Extremities: No clubbing or cyanosis. No edema.  Distal pedal pulses are 2+ bilaterally. Neuro: Alert and oriented X 3. Moves all extremities spontaneously. No focal deficits noted. Psych:  Responds to questions appropriately with a normal affect. Skin: No rashes or lesions noted  Wt Readings from Last 3 Encounters:  06/05/19 292 lb (132.5 kg)  09/09/18 290 lb (131.5 kg)  08/26/18 289 lb 3.2 oz (131.2 kg)  Studies/Labs Reviewed:   EKG:  EKG is*** ordered today.  The ekg ordered today demonstrates ***  Recent Labs: 06/05/2019: ALT 42; BUN 10; Creatinine, Ser 0.90; Hemoglobin 14.9; Platelets 196; Potassium 4.6; Sodium 136   Lipid Panel    Component Value Date/Time   CHOL 116 06/05/2019 1402   TRIG 208 (H) 06/05/2019 1402   HDL 27 (L) 06/05/2019 1402   CHOLHDL 4.3 06/05/2019 1402   LDLCALC 55 06/05/2019 1402    Additional studies/ records that were reviewed today include:   CTO: 08/25/2017  rox LAD lesion is 100% stenosed.  A drug-eluting stent was successfully placed using a STENT SYNERGY DES 2.5X38.  A drug-eluting stent was successfully placed using a STENT SYNERGY DES 2.5X16.  Post intervention, there is a 0% residual stenosis.  Ost 1st Diag to 1st Diag lesion is 100% stenosed.   1. Successful CTO PCI of the LAD with DES x 2.  Recommend uninterrupted dual antiplatelet therapy with Aspirin 81mg  daily and Clopidogrel 75mg  daily for a minimum of 12 months (ACS - Class I recommendation).  Anticipate DC in am.   ETT: 08/2018  No diagnostic ST segment changes to indicate ischemia. Maximum workload 8.7 METS. No chest pain reported. Hypertensive response. No arrhythmias. Low risk Duke treadmill score of 6.5.  Blood pressure demonstrated a hypertensive response to exercise.  Assessment:    No diagnosis  found.   Plan:   In order of problems listed above:  1. ***    Medication Adjustments/Labs and Tests Ordered: Current medicines are reviewed at length with the patient today.  Concerns regarding medicines are outlined above.  Medication changes, Labs and Tests ordered today are listed in the Patient Instructions below. There are no Patient Instructions on file for this visit.   Signed, Erma Heritage, PA-C  06/19/2019 4:55 PM    [facility] [address] [place], [place] [address] Phone: [phone] Fax: [phone]

## 2019-06-20 ENCOUNTER — Ambulatory Visit: Payer: BC Managed Care – PPO | Admitting: Student

## 2019-06-22 ENCOUNTER — Encounter: Payer: Self-pay | Admitting: Student

## 2019-06-26 ENCOUNTER — Telehealth: Payer: Self-pay

## 2019-06-26 NOTE — Telephone Encounter (Signed)
  Patient Consent for Virtual Visit         Carl [name] has provided verbal consent on 06/26/2019 for a virtual visit (video or telephone).   CONSENT FOR VIRTUAL VISIT FOR:  Carl [name]  By participating in this virtual visit I agree to the following:  I hereby voluntarily request, consent and authorize [place] and its employed or contracted physicians, physician assistants, nurse practitioners or other licensed health care professionals (the Practitioner), to provide me with telemedicine health care services (the "Services") as deemed necessary by the treating Practitioner. I acknowledge and consent to receive the Services by the Practitioner via telemedicine. I understand that the telemedicine visit will involve communicating with the Practitioner through live audiovisual communication technology and the disclosure of certain medical information by electronic transmission. I acknowledge that I have been given the opportunity to request an in-person assessment or other available alternative prior to the telemedicine visit and am voluntarily participating in the telemedicine visit.  I understand that I have the right to withhold or withdraw my consent to the use of telemedicine in the course of my care at any time, without affecting my right to future care or treatment, and that the Practitioner or I may terminate the telemedicine visit at any time. I understand that I have the right to inspect all information obtained and/or recorded in the course of the telemedicine visit and may receive copies of available information for a reasonable fee.  I understand that some of the potential risks of receiving the Services via telemedicine include:  Marland Kitchen Delay or interruption in medical evaluation due to technological equipment failure or disruption; . Information transmitted may not be sufficient (e.g. poor resolution of images) to allow for appropriate medical decision making by the Practitioner;  and/or  . In rare instances, security protocols could fail, causing a breach of personal health information.  Furthermore, I acknowledge that it is my responsibility to provide information about my medical history, conditions and care that is complete and accurate to the best of my ability. I acknowledge that Practitioner's advice, recommendations, and/or decision may be based on factors not within their control, such as incomplete or inaccurate data provided by me or distortions of diagnostic images or specimens that may result from electronic transmissions. I understand that the practice of medicine is not an exact science and that Practitioner makes no warranties or guarantees regarding treatment outcomes. I acknowledge that a copy of this consent can be made available to me via my patient portal ([facility]), or I can request a printed copy by calling the office of [facility].    I understand that my insurance will be billed for this visit.   I have read or had this consent read to me. . I understand the contents of this consent, which adequately explains the benefits and risks of the Services being provided via telemedicine.  . I have been provided ample opportunity to ask questions regarding this consent and the Services and have had my questions answered to my satisfaction. . I give my informed consent for the services to be provided through the use of telemedicine in my medical care

## 2019-07-07 ENCOUNTER — Telehealth: Payer: Self-pay | Admitting: Cardiology

## 2019-07-07 NOTE — Telephone Encounter (Signed)
Pt requested we send most recent stress test done last July and nuclear study done in 2018 to [facility] - sent both via [facility] fax # [phone]

## 2019-07-07 NOTE — Telephone Encounter (Signed)
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

## 2019-07-11 ENCOUNTER — Telehealth (INDEPENDENT_AMBULATORY_CARE_PROVIDER_SITE_OTHER): Payer: BC Managed Care – PPO | Admitting: Cardiology

## 2019-07-11 ENCOUNTER — Encounter: Payer: Self-pay | Admitting: Cardiology

## 2019-07-11 VITALS — Ht 71.0 in | Wt 289.0 lb

## 2019-07-11 DIAGNOSIS — I2510 Atherosclerotic heart disease of native coronary artery without angina pectoris: Secondary | ICD-10-CM

## 2019-07-11 DIAGNOSIS — E782 Mixed hyperlipidemia: Secondary | ICD-10-CM

## 2019-07-11 DIAGNOSIS — I10 Essential (primary) hypertension: Secondary | ICD-10-CM | POA: Diagnosis not present

## 2019-07-11 MED ORDER — LISINOPRIL 5 MG PO TABS: 5.0000 mg | ORAL_TABLET | Freq: Every day | ORAL | 3 refills | Status: DC

## 2019-07-11 MED ORDER — METOPROLOL SUCCINATE ER 50 MG PO TB24: 50.0000 mg | ORAL_TABLET | Freq: Every day | ORAL | 3 refills | Status: DC

## 2019-07-11 MED ORDER — NITROGLYCERIN 0.4 MG SL SUBL: 0.4000 mg | SUBLINGUAL_TABLET | SUBLINGUAL | 3 refills | Status: DC | PRN

## 2019-07-11 MED ORDER — ATORVASTATIN CALCIUM 80 MG PO TABS: 80.0000 mg | ORAL_TABLET | Freq: Every day | ORAL | 3 refills | Status: DC

## 2019-07-11 NOTE — Addendum Note (Signed)
Addended by: Levonne Hubert on: 07/11/2019 02:43 PM   Modules accepted: Orders

## 2019-07-11 NOTE — Patient Instructions (Signed)
Medication Instructions:  Your physician recommends that you continue on your current medications as directed. Please refer to the Current Medication list given to you today.  *If you need a refill on your cardiac medications before your next appointment, please call your pharmacy*   Lab Work: NONE   If you have labs (blood work) drawn today and your tests are completely normal, you will receive your results only by: . MyChart Message (if you have MyChart) OR . A paper copy in the mail If you have any lab test that is abnormal or we need to change your treatment, we will call you to review the results.   Testing/Procedures: NONE    Follow-Up: At [facility], you and your health needs are our priority.  As part of our continuing mission to provide you with exceptional heart care, we have created designated Provider Care Teams.  These Care Teams include your primary Cardiologist (physician) and Advanced Practice Providers (APPs -  Physician Assistants and Nurse Practitioners) who all work together to provide you with the care you need, when you need it.  We recommend signing up for the patient portal called "MyChart".  Sign up information is provided on this After Visit Summary.  MyChart is used to connect with patients for Virtual Visits (Telemedicine).  Patients are able to view lab/test results, encounter notes, upcoming appointments, etc.  Non-urgent messages can be sent to your provider as well.   To learn more about what you can do with MyChart, go to [email or web address].    Your next appointment:   1 year(s)  The format for your next appointment:   In Person  Provider:   Jonathan Branch, MD   Other Instructions Thank you for choosing [facility]!    

## 2019-07-11 NOTE — Progress Notes (Signed)
Virtual Visit via Telephone Note   This visit type was conducted due to national recommendations for restrictions regarding the COVID-19 Pandemic (e.g. social distancing) in an effort to limit this patient's exposure and mitigate transmission in our community.  Due to his co-morbid illnesses, this patient is at least at moderate risk for complications without adequate follow up.  This format is felt to be most appropriate for this patient at this time.  The patient did not have access to video technology/had technical difficulties with video requiring transitioning to audio format only (telephone).  All issues noted in this document were discussed and addressed.  No physical exam could be performed with this format.  Please refer to the patient's chart for his  consent to telehealth for [facility].   The patient was identified using 2 identifiers.  Date:  07/11/2019   ID:  Carl [name], DOB [birth year], MRN [id number]  Patient Location: Home Provider Location: Office  PCP:  Dettinger, Fransisca Kaufmann, MD  Cardiologist:  Carlyle Dolly, MD  Electrophysiologist:  None   Evaluation Performed:  Follow-Up Visit  Chief Complaint:  Follow up visit  History of Present Illness:    Carl [name] is a 44 y.o. male seen today for follow up of the following medical problem.s  1. CAD - abnormal exericse stress in 2015. - cath 2015 at [facility] with occluded proximal LAD, LAD filled with left-left and right-left collaterals. Medically managed - echo Jan 2018 LVEF 55-60%   - nuclear stress Jan 2018 showed apical and distal anterolateral infarct with ischemia. Overall intermediate to high risk.   06/2017 cath with prox LAD 100%, RCA prox 50%, RPDA 40%, norma LCX - he was referred for evaluation for CTO intervention on his LAD - 08/2017 succesful CTO PCI of LAD with DES x 2.  Imdur stopped that admission due to headaches.    - no recent chest pain. No SOB or DOE - compliant with  meds. Does treadmill x 10-15 minutes brisk walk without troubles. - lisionpril previously stopped to allow room to tirate antianginals prior to his most recent cath.    08/2018 GXT no ischemia, duke treadmill score 6.5  - no recent chest pain. No SOB or DOE - compliant with meds    2. Hyperlipidemia -05/2019 TC 116 TG 208 HDL 27 LDL 55 - compliant with statin  3. HTN - compliant with meds - 5/11 visit at [facility] bp 107/68    SH: completed covid vaccine x 2   The patient does not have symptoms concerning for COVID-19 infection (fever, chills, cough, or new shortness of breath).    Past Medical History:  Diagnosis Date  . Anxiety   . CAD in native artery    a. cath in 2015 [facility] showing occluded proximal LAD, LAD filled with left-left and right-left collaterals, otherwise 20-30% prox-mid RCA, LVEF 55%. b. Abnormal nuc 02/2016 - [facility] medically. c. redo cath in 06/2017 showing 100% Prox LAD stenosis, 50% RCA, and 40% RPDA and underwent CTO PCI of the LAD with DESx2 to in 08/2017  . Daily headache    "since I started Plavix" (08/25/2017)  . Depression   . Diabetes mellitus type 2 in obese ([place])   . Hyperlipidemia   . Hypertension   . OSA on CPAP    Past Surgical History:  Procedure Laterality Date  . CORONARY CTO INTERVENTION N/A 08/25/2017   Procedure: CORONARY CTO INTERVENTION;  Surgeon: Martinique, Peter M, MD;  Location: [place] [facility];  Service: Cardiovascular;  Laterality: N/A;  . LEFT HEART CATH AND CORONARY ANGIOGRAPHY N/A 07/21/2017   Procedure: LEFT HEART CATH AND CORONARY ANGIOGRAPHY;  Surgeon: Belva Crome, MD;  Location: [place] [facility];  Service: Cardiovascular;  Laterality: N/A;  . MOLE REMOVAL Right    "chin"  . TUMOR EXCISION Left 1991   knee area     Current Meds  Medication Sig  . Ascorbic Acid (VITAMIN C) 1000 MG tablet Take 2,000 mg by mouth daily.  Marland Kitchen aspirin EC 81 MG tablet Take 81 mg by mouth daily.   Marland Kitchen atorvastatin (LIPITOR) 80 MG tablet Take  1 tablet (80 mg total) by mouth daily.  Marland Kitchen lisinopril (ZESTRIL) 5 MG tablet Take 1 tablet (5 mg total) by mouth daily.  . metFORMIN (GLUCOPHAGE XR) 500 MG 24 hr tablet Take 2 tablets (1,000 mg total) by mouth in the morning and at bedtime. (Patient taking differently: Take 500 mg by mouth in the morning and at bedtime. )  . metoprolol succinate (TOPROL-XL) 50 MG 24 hr tablet Take 1 tablet (50 mg total) by mouth daily. Take with or immediately following a meal.  . nitroGLYCERIN (NITROSTAT) 0.4 MG SL tablet Place 1 tablet (0.4 mg total) under the tongue every 5 (five) minutes as needed for chest pain.  . Semaglutide,0.25 or 0.5MG /DOS, (OZEMPIC, 0.25 OR 0.5 MG/DOSE,) 2 MG/1.5ML SOPN Inject 0.5 mg into the skin once a week.  . [DISCONTINUED] lisinopril (ZESTRIL) 5 MG tablet TAKE 1 TABLET BY MOUTH EVERY DAY  . [DISCONTINUED] metoprolol succinate (TOPROL-XL) 50 MG 24 hr tablet Take 1 tablet (50 mg total) by mouth daily. Take with or immediately following a meal.  . [DISCONTINUED] nitroGLYCERIN (NITROSTAT) 0.4 MG SL tablet Place 1 tablet (0.4 mg total) under the tongue every 5 (five) minutes as needed for chest pain.     Allergies:   Patient has no known allergies.   Social History   Tobacco Use  . Smoking status: Current Every Day Smoker    Packs/day: 2.00    Years: 24.00    Pack years: 48.00    Types: Cigarettes    Start date: 01/03/1993  . Smokeless tobacco: Never Used  Substance Use Topics  . Alcohol use: Yes    Comment: 08/25/2017 "0-a few drinks/year"  . Drug use: Never     Family Hx: The patient's family history includes Cancer in his mother; Diabetes in his father and mother.  ROS:   Please see the history of present illness.     All other systems reviewed and are negative.   Prior CV studies:   The following studies were reviewed today:    Labs/Other Tests and Data Reviewed:    EKG:  No ECG reviewed.  Recent Labs: 06/05/2019: ALT 42; BUN 10; Creatinine, Ser 0.90;  Hemoglobin 14.9; Platelets 196; Potassium 4.6; Sodium 136   Recent Lipid Panel Lab Results  Component Value Date/Time   CHOL 116 06/05/2019 02:02 PM   TRIG 208 (H) 06/05/2019 02:02 PM   HDL 27 (L) 06/05/2019 02:02 PM   CHOLHDL 4.3 06/05/2019 02:02 PM   LDLCALC 55 06/05/2019 02:02 PM    Wt Readings from Last 3 Encounters:  07/11/19 289 lb (131.1 kg)  06/05/19 292 lb (132.5 kg)  09/09/18 290 lb (131.5 kg)     Objective:    Vital Signs:  Ht 5\' 11"  (1.803 m)   Wt 289 lb (131.1 kg)   BMI 40.31 kg/m    Normal affect. Normal speech pattern  and tone. Comfortable, no apparent distress. No audible signs of sob or wheezing.   ASSESSMENT & PLAN:    1. CAD - no symptoms, continue current meds  2. Hyperlipidema - LDL is at goal, we discussed lifestyle modifications to improve HDL and TGs  3. HTN - at goal based on recent clinic visits with other providers - continue current meds   F/u 1 year   COVID-19 Education: The signs and symptoms of COVID-19 were discussed with the patient and how to seek care for testing (follow up with PCP or arrange E-visit).  The importance of social distancing was discussed today.  Time:   Today, I have spent 19 minutes with the patient with telehealth technology discussing the above problems.     Carlyle Dolly MD

## 2019-09-04 ENCOUNTER — Ambulatory Visit: Payer: BC Managed Care – PPO | Admitting: Family Medicine

## 2019-10-12 ENCOUNTER — Encounter: Payer: Self-pay | Admitting: Family Medicine

## 2019-10-12 ENCOUNTER — Ambulatory Visit: Payer: BC Managed Care – PPO | Admitting: Family Medicine

## 2019-10-12 ENCOUNTER — Telehealth: Payer: Self-pay | Admitting: Pharmacist

## 2019-10-12 ENCOUNTER — Other Ambulatory Visit: Payer: Self-pay

## 2019-10-12 VITALS — BP 128/85 | HR 103 | Temp 98.1°F | Ht 71.0 in | Wt 290.2 lb

## 2019-10-12 DIAGNOSIS — E1169 Type 2 diabetes mellitus with other specified complication: Secondary | ICD-10-CM

## 2019-10-12 DIAGNOSIS — E785 Hyperlipidemia, unspecified: Secondary | ICD-10-CM | POA: Diagnosis not present

## 2019-10-12 DIAGNOSIS — E118 Type 2 diabetes mellitus with unspecified complications: Secondary | ICD-10-CM

## 2019-10-12 DIAGNOSIS — E1159 Type 2 diabetes mellitus with other circulatory complications: Secondary | ICD-10-CM

## 2019-10-12 DIAGNOSIS — I10 Essential (primary) hypertension: Secondary | ICD-10-CM

## 2019-10-12 LAB — BAYER DCA HB A1C WAIVED: HB A1C (BAYER DCA - WAIVED): 9.3 % — ABNORMAL HIGH (ref <7.0)

## 2019-10-12 MED ORDER — OZEMPIC (1 MG/DOSE) 2 MG/1.5ML ~~LOC~~ SOPN: 1.0000 mg | PEN_INJECTOR | SUBCUTANEOUS | 6 refills | Status: DC

## 2019-10-12 NOTE — Telephone Encounter (Signed)
Increase to Ozempic 1mg  sq weekly Assisted patient with copay card set up (call placed to [facility] to ensure copay card worked--$24.99) Provided counseling

## 2019-10-12 NOTE — Progress Notes (Signed)
 BP 128/85   Pulse (!) 103   Temp 98.1 F (36.7 C) (Temporal)   Ht 5' 11" (1.803 m)   Wt 290 lb 3.2 oz (131.6 kg)   BMI 40.47 kg/m    Subjective:   Patient ID: Carl [name], male    DOB: 06/13/1975, 43 y.o.   MRN: [id number]  HPI: Carl [name] is a 43 y.o. male presenting on 10/12/2019 for Diabetes (3 mos ckup), Hypertension, Hand Injury (right thumb nail), and Spasms (left arm started elbow area goes down effects hand)   HPI Type 2 diabetes mellitus Patient comes in today for recheck of his diabetes. Patient has been currently taking Ozempic 0.5, although he has been out of the last 3 weeks because of price, also taking Metformin 1000 daily. Patient is currently on an ACE inhibitor/ARB. Patient has not seen an ophthalmologist this year. Patient denies any issues with their feet. The symptom started onset as an adult hypertension and hyperlipidemia ARE RELATED TO DM   Hypertension Patient is currently on lisinopril, and their blood pressure today is 128/85. Patient denies any lightheadedness or dizziness. Patient denies headaches, blurred vision, chest pains, shortness of breath, or weakness. Denies any side effects from medication and is content with current medication.   Hyperlipidemia Patient is coming in for recheck of his hyperlipidemia. The patient is currently taking Lipitor. They deny any issues with myalgias or history of liver damage from it. They deny any focal numbness or weakness or chest pain.   Relevant past medical, surgical, family and social history reviewed and updated as indicated. Interim medical history since our last visit reviewed. Allergies and medications reviewed and updated.  Review of Systems  Constitutional: Negative for chills and fever.  Eyes: Negative for discharge.  Respiratory: Negative for shortness of breath and wheezing.   Cardiovascular: Negative for chest pain and leg swelling.  Musculoskeletal: Negative for back pain and gait problem.    Skin: Negative for rash.  Neurological: Negative for dizziness, weakness and light-headedness.  All other systems reviewed and are negative.   Per HPI unless specifically indicated above   Allergies as of 10/12/2019   No Known Allergies     Medication List       Accurate as of October 12, 2019 11:53 AM. If you have any questions, ask your nurse or doctor.        STOP taking these medications   Ozempic (0.25 or 0.5 MG/DOSE) 2 MG/1.5ML Sopn Generic drug: Semaglutide(0.25 or 0.5MG/DOS) Replaced by: Ozempic (1 MG/DOSE) 2 MG/1.5ML Sopn Stopped by: Joshua A Dettinger, MD     TAKE these medications   aspirin EC 81 MG tablet Take 81 mg by mouth daily.   atorvastatin 80 MG tablet Commonly known as: LIPITOR Take 1 tablet (80 mg total) by mouth daily.   lisinopril 5 MG tablet Commonly known as: ZESTRIL Take 1 tablet (5 mg total) by mouth daily.   metFORMIN 500 MG 24 hr tablet Commonly known as: Glucophage XR Take 2 tablets (1,000 mg total) by mouth in the morning and at bedtime. What changed: how much to take   metoprolol succinate 50 MG 24 hr tablet Commonly known as: TOPROL-XL Take 1 tablet (50 mg total) by mouth daily. Take with or immediately following a meal.   nitroGLYCERIN 0.4 MG SL tablet Commonly known as: NITROSTAT Place 1 tablet (0.4 mg total) under the tongue every 5 (five) minutes as needed for chest pain.   Ozempic (1 MG/DOSE) 2 MG/1.5ML Sopn   Generic drug: Semaglutide (1 MG/DOSE) Inject 0.75 mLs (1 mg total) into the skin once a week. Replaces: Ozempic (0.25 or 0.5 MG/DOSE) 2 MG/1.5ML Sopn Started by: Joshua A Dettinger, MD   vitamin C 1000 MG tablet Take 2,000 mg by mouth daily.        Objective:   BP 128/85   Pulse (!) 103   Temp 98.1 F (36.7 C) (Temporal)   Ht 5' 11" (1.803 m)   Wt 290 lb 3.2 oz (131.6 kg)   BMI 40.47 kg/m   Wt Readings from Last 3 Encounters:  10/12/19 290 lb 3.2 oz (131.6 kg)  07/11/19 289 lb (131.1 kg)  06/05/19  292 lb (132.5 kg)    Physical Exam Vitals and nursing note reviewed.  Constitutional:      General: He is not in acute distress.    Appearance: He is well-developed. He is not diaphoretic.  Eyes:     General: No scleral icterus.    Conjunctiva/sclera: Conjunctivae normal.  Neck:     Thyroid: No thyromegaly.  Cardiovascular:     Rate and Rhythm: Normal rate and regular rhythm.     Heart sounds: Normal heart sounds. No murmur heard.   Pulmonary:     Effort: Pulmonary effort is normal. No respiratory distress.     Breath sounds: Normal breath sounds. No wheezing.  Musculoskeletal:        General: Normal range of motion.     Cervical back: Neck supple.  Lymphadenopathy:     Cervical: No cervical adenopathy.  Skin:    General: Skin is warm and dry.     Findings: No rash.  Neurological:     Mental Status: He is alert and oriented to person, place, and time.     Coordination: Coordination normal.  Psychiatric:        Behavior: Behavior normal.       Assessment & Plan:   Problem List Items Addressed This Visit      Cardiovascular and Mediastinum   Hypertension associated with diabetes (HCC)   Relevant Medications   Semaglutide, 1 MG/DOSE, (OZEMPIC, 1 MG/DOSE,) 2 MG/1.5ML SOPN   Other Relevant Orders   BMP8+EGFR     Endocrine   Hyperlipidemia associated with type 2 diabetes mellitus (HCC)   Relevant Medications   Semaglutide, 1 MG/DOSE, (OZEMPIC, 1 MG/DOSE,) 2 MG/1.5ML SOPN   Controlled diabetes mellitus type 2 with complications (HCC) - Primary   Relevant Medications   Semaglutide, 1 MG/DOSE, (OZEMPIC, 1 MG/DOSE,) 2 MG/1.5ML SOPN   Other Relevant Orders   Bayer DCA Hb A1c Waived   BMP8+EGFR      A1c is 9.3, will increase Ozempic to 1 mg, gave coupon card for price and should be cheaper with that. Follow up plan: Return in about 3 months (around 01/12/2020), or if symptoms worsen or fail to improve, for Diabetes recheck.  Counseling provided for all of the  vaccine components Orders Placed This Encounter  Procedures  . Bayer DCA Hb A1c Waived  . BMP8+EGFR    Joshua Dettinger, MD [facility] 10/12/2019, 11:53 AM     

## 2019-10-13 LAB — BMP8+EGFR
BUN/Creatinine Ratio: 16 (ref 9–20)
BUN: 16 mg/dL (ref 6–24)
CO2: 21 mmol/L (ref 20–29)
Calcium: 9 mg/dL (ref 8.7–10.2)
Chloride: 101 mmol/L (ref 96–106)
Creatinine, Ser: 0.97 mg/dL (ref 0.76–1.27)
GFR calc Af Amer: 109 mL/min/{1.73_m2} (ref >59)
GFR calc non Af Amer: 94 mL/min/{1.73_m2} (ref >59)
Glucose: 279 mg/dL — ABNORMAL HIGH (ref 65–99)
Potassium: 4.3 mmol/L (ref 3.5–5.2)
Sodium: 135 mmol/L (ref 134–144)

## 2019-12-11 ENCOUNTER — Telehealth: Payer: Self-pay

## 2019-12-11 MED ORDER — OZEMPIC (1 MG/DOSE) 2 MG/1.5ML ~~LOC~~ SOPN: 1.0000 mg | PEN_INJECTOR | SUBCUTANEOUS | 6 refills | Status: DC

## 2019-12-11 NOTE — Telephone Encounter (Signed)
Corrected pen RX to 74mL pen (1mg  strength) for 1 month supply Pen sizes have recently changed

## 2020-01-15 ENCOUNTER — Encounter: Payer: Self-pay | Admitting: Family Medicine

## 2020-01-15 ENCOUNTER — Ambulatory Visit: Payer: BC Managed Care – PPO | Admitting: Family Medicine

## 2020-01-15 ENCOUNTER — Other Ambulatory Visit: Payer: Self-pay

## 2020-01-15 VITALS — BP 129/75 | HR 92 | Temp 98.0°F | Ht 71.0 in | Wt 290.0 lb

## 2020-01-15 DIAGNOSIS — I152 Hypertension secondary to endocrine disorders: Secondary | ICD-10-CM

## 2020-01-15 DIAGNOSIS — E118 Type 2 diabetes mellitus with unspecified complications: Secondary | ICD-10-CM | POA: Diagnosis not present

## 2020-01-15 DIAGNOSIS — E1159 Type 2 diabetes mellitus with other circulatory complications: Secondary | ICD-10-CM

## 2020-01-15 DIAGNOSIS — E1169 Type 2 diabetes mellitus with other specified complication: Secondary | ICD-10-CM | POA: Diagnosis not present

## 2020-01-15 DIAGNOSIS — E785 Hyperlipidemia, unspecified: Secondary | ICD-10-CM | POA: Diagnosis not present

## 2020-01-15 LAB — BAYER DCA HB A1C WAIVED: HB A1C (BAYER DCA - WAIVED): 7.5 % — ABNORMAL HIGH (ref <7.0)

## 2020-01-15 MED ORDER — RYBELSUS 7 MG PO TABS: 7.0000 mg | ORAL_TABLET | Freq: Every day | ORAL | 3 refills | Status: DC

## 2020-01-15 MED ORDER — METFORMIN HCL ER 500 MG PO TB24: 1000.0000 mg | ORAL_TABLET | Freq: Two times a day (BID) | ORAL | 3 refills | Status: DC

## 2020-01-15 NOTE — Progress Notes (Signed)
BP 129/75   Temp 98 F (36.7 C)   Ht '5\' 11"'  (1.803 m)   SpO2 96%   BMI 40.47 kg/m    Subjective:   Patient ID: Carl [name], male    DOB: 02/24/1975, 44 y.o.   MRN: [id number]  HPI: Carl [name] is a 44 y.o. male presenting on 01/15/2020 for Medical Management of Chronic Issues and Diabetes   HPI Type 2 diabetes mellitus Patient comes in today for recheck of his diabetes. Patient has been currently taking Metformin and Ozempic but has been having some hassles with getting Ozempic through insurance and has been off for 2 weeks because of it. Patient is currently on an ACE inhibitor/ARB. Patient has seen an ophthalmologist this year. Patient denies any issues with their feet. The symptom started onset as an adult hypertension and hyperlipidemia and obesity ARE RELATED TO DM   Hypertension Patient is currently on lisinopril and metoprolol, and their blood pressure today is 129/75. Patient denies any lightheadedness or dizziness. Patient denies headaches, blurred vision, chest pains, shortness of breath, or weakness. Denies any side effects from medication and is content with current medication.   Hyperlipidemia Patient is coming in for recheck of his hyperlipidemia. The patient is currently taking atorvastatin.  They deny any issues with myalgias or history of liver damage from it. They deny any focal numbness or weakness or chest pain.   Relevant past medical, surgical, family and social history reviewed and updated as indicated. Interim medical history since our last visit reviewed. Allergies and medications reviewed and updated.  Review of Systems  Constitutional: Negative for chills and fever.  HENT: Negative for ear pain and tinnitus.   Eyes: Negative for pain.  Respiratory: Negative for cough, shortness of breath and wheezing.   Cardiovascular: Negative for chest pain, palpitations and leg swelling.  Gastrointestinal: Negative for abdominal pain, blood in stool,  constipation and diarrhea.  Genitourinary: Negative for dysuria and hematuria.  Musculoskeletal: Negative for back pain and myalgias.  Skin: Negative for rash.  Neurological: Negative for dizziness, weakness and headaches.  Psychiatric/Behavioral: Negative for suicidal ideas.    Per HPI unless specifically indicated above   Allergies as of 01/15/2020   No Known Allergies     Medication List       Accurate as of January 15, 2020 11:51 AM. If you have any questions, ask your nurse or doctor.        STOP taking these medications   Ozempic (1 MG/DOSE) 2 MG/1.5ML Sopn Generic drug: Semaglutide (1 MG/DOSE) Replaced by: Rybelsus 7 MG Tabs Stopped by: Fransisca Kaufmann Lathyn Griggs, MD     TAKE these medications   aspirin EC 81 MG tablet Take 81 mg by mouth daily.   atorvastatin 80 MG tablet Commonly known as: LIPITOR Take 1 tablet (80 mg total) by mouth daily.   lisinopril 5 MG tablet Commonly known as: ZESTRIL Take 1 tablet (5 mg total) by mouth daily.   metFORMIN 500 MG 24 hr tablet Commonly known as: Glucophage XR Take 2 tablets (1,000 mg total) by mouth in the morning and at bedtime. What changed: how much to take   metoprolol succinate 50 MG 24 hr tablet Commonly known as: TOPROL-XL Take 1 tablet (50 mg total) by mouth daily. Take with or immediately following a meal.   nitroGLYCERIN 0.4 MG SL tablet Commonly known as: NITROSTAT Place 1 tablet (0.4 mg total) under the tongue every 5 (five) minutes as needed for chest pain.  Rybelsus 7 MG Tabs Generic drug: Semaglutide Take 7 mg by mouth daily at 12 noon. Replaces: Ozempic (1 MG/DOSE) 2 MG/1.5ML Sopn Started by: Fransisca Kaufmann Tove Wideman, MD   vitamin C 1000 MG tablet Take 2,000 mg by mouth daily.        Objective:   BP 129/75   Temp 98 F (36.7 C)   Ht '5\' 11"'  (1.803 m)   SpO2 96%   BMI 40.47 kg/m   Wt Readings from Last 3 Encounters:  10/12/19 290 lb 3.2 oz (131.6 kg)  07/11/19 289 lb (131.1 kg)  06/05/19  292 lb (132.5 kg)    Physical Exam Vitals and nursing note reviewed.  Constitutional:      General: He is not in acute distress.    Appearance: He is well-developed. He is not diaphoretic.  Eyes:     General: No scleral icterus.    Conjunctiva/sclera: Conjunctivae normal.  Neck:     Thyroid: No thyromegaly.  Cardiovascular:     Rate and Rhythm: Normal rate and regular rhythm.     Heart sounds: Normal heart sounds. No murmur heard.   Pulmonary:     Effort: Pulmonary effort is normal. No respiratory distress.     Breath sounds: Normal breath sounds. No wheezing.  Musculoskeletal:        General: Normal range of motion.     Cervical back: Neck supple.  Lymphadenopathy:     Cervical: No cervical adenopathy.  Skin:    General: Skin is warm and dry.     Findings: No rash.  Neurological:     Mental Status: He is alert and oriented to person, place, and time.     Coordination: Coordination normal.  Psychiatric:        Behavior: Behavior normal.     A1c is 7.5 today  Assessment & Plan:   Problem List Items Addressed This Visit      Cardiovascular and Mediastinum   Hypertension associated with diabetes ([place])   Relevant Medications   metFORMIN (GLUCOPHAGE XR) 500 MG 24 hr tablet   Semaglutide (RYBELSUS) 7 MG TABS   Other Relevant Orders   CMP14+EGFR     Endocrine   Hyperlipidemia associated with type 2 diabetes mellitus (HCC)   Relevant Medications   metFORMIN (GLUCOPHAGE XR) 500 MG 24 hr tablet   Semaglutide (RYBELSUS) 7 MG TABS   Other Relevant Orders   Lipid panel   Controlled diabetes mellitus type 2 with complications (HCC) - Primary   Relevant Medications   metFORMIN (GLUCOPHAGE XR) 500 MG 24 hr tablet   Semaglutide (RYBELSUS) 7 MG TABS   Other Relevant Orders   Bayer DCA Hb A1c Waived   CBC with Differential/Platelet   CMP14+EGFR      Will switch from Ozempic to Rybelsus because of issues with the injectable.  Maybe this can give [place] more consistency on  getting it from the insurance company. Follow up plan: Return in about 3 months (around 04/16/2020), or if symptoms worsen or fail to improve, for Hypertension and hyperlipidemia and diabetes.  Counseling provided for all of the vaccine components Orders Placed This Encounter  Procedures  . Bayer DCA Hb A1c Waived  . CBC with Differential/Platelet  . CMP14+EGFR  . Lipid panel    Caryl Pina, MD [facility] 01/15/2020, 11:51 AM

## 2020-01-16 LAB — CMP14+EGFR
ALT: 11 IU/L (ref 0–44)
AST: 15 IU/L (ref 0–40)
Albumin/Globulin Ratio: 2 (ref 1.2–2.2)
Albumin: 4.2 g/dL (ref 4.0–5.0)
Alkaline Phosphatase: 58 IU/L (ref 44–121)
BUN/Creatinine Ratio: 13 (ref 9–20)
BUN: 20 mg/dL (ref 6–24)
Bilirubin Total: 0.2 mg/dL (ref 0.0–1.2)
CO2: 24 mmol/L (ref 20–29)
Calcium: 9.1 mg/dL (ref 8.7–10.2)
Chloride: 98 mmol/L (ref 96–106)
Creatinine, Ser: 1.52 mg/dL — ABNORMAL HIGH (ref 0.76–1.27)
GFR calc Af Amer: 63 mL/min/{1.73_m2} (ref >59)
GFR calc non Af Amer: 54 mL/min/{1.73_m2} — ABNORMAL LOW (ref >59)
Globulin, Total: 2.1 g/dL (ref 1.5–4.5)
Glucose: 269 mg/dL — ABNORMAL HIGH (ref 65–99)
Potassium: 4.3 mmol/L (ref 3.5–5.2)
Sodium: 137 mmol/L (ref 134–144)
Total Protein: 6.3 g/dL (ref 6.0–8.5)

## 2020-01-16 LAB — CBC WITH DIFFERENTIAL/PLATELET
Basophils Absolute: 0 10*3/uL (ref 0.0–0.2)
Basos: 1 %
EOS (ABSOLUTE): 0.2 10*3/uL (ref 0.0–0.4)
Eos: 2 %
Hematocrit: 31.7 % — ABNORMAL LOW (ref 37.5–51.0)
Hemoglobin: 10.3 g/dL — ABNORMAL LOW (ref 13.0–17.7)
Immature Grans (Abs): 0.1 10*3/uL (ref 0.0–0.1)
Immature Granulocytes: 1 %
Lymphocytes Absolute: 1.4 10*3/uL (ref 0.7–3.1)
Lymphs: 19 %
MCH: 28.6 pg (ref 26.6–33.0)
MCHC: 32.5 g/dL (ref 31.5–35.7)
MCV: 88 fL (ref 79–97)
Monocytes Absolute: 0.4 10*3/uL (ref 0.1–0.9)
Monocytes: 6 %
Neutrophils Absolute: 5.3 10*3/uL (ref 1.4–7.0)
Neutrophils: 71 %
Platelets: 223 10*3/uL (ref 150–450)
RBC: 3.6 x10E6/uL — ABNORMAL LOW (ref 4.14–5.80)
RDW: 12.8 % (ref 11.6–15.4)
WBC: 7.3 10*3/uL (ref 3.4–10.8)

## 2020-01-16 LAB — LIPID PANEL
Chol/HDL Ratio: 3.9 ratio (ref 0.0–5.0)
Cholesterol, Total: 128 mg/dL (ref 100–199)
HDL: 33 mg/dL — ABNORMAL LOW (ref >39)
LDL Chol Calc (NIH): 52 mg/dL (ref 0–99)
Triglycerides: 276 mg/dL — ABNORMAL HIGH (ref 0–149)
VLDL Cholesterol Cal: 43 mg/dL — ABNORMAL HIGH (ref 5–40)

## 2020-02-05 ENCOUNTER — Other Ambulatory Visit: Payer: Self-pay

## 2020-02-05 ENCOUNTER — Other Ambulatory Visit: Payer: BC Managed Care – PPO

## 2020-02-05 DIAGNOSIS — E1169 Type 2 diabetes mellitus with other specified complication: Secondary | ICD-10-CM

## 2020-02-05 DIAGNOSIS — I152 Hypertension secondary to endocrine disorders: Secondary | ICD-10-CM

## 2020-02-06 LAB — CBC WITH DIFFERENTIAL/PLATELET
Basophils Absolute: 0 10*3/uL (ref 0.0–0.2)
Basos: 0 %
EOS (ABSOLUTE): 0.2 10*3/uL (ref 0.0–0.4)
Eos: 2 %
Hematocrit: 42.7 % (ref 37.5–51.0)
Hemoglobin: 14.7 g/dL (ref 13.0–17.7)
Immature Grans (Abs): 0 10*3/uL (ref 0.0–0.1)
Immature Granulocytes: 0 %
Lymphocytes Absolute: 3.2 10*3/uL — ABNORMAL HIGH (ref 0.7–3.1)
Lymphs: 34 %
MCH: 31.3 pg (ref 26.6–33.0)
MCHC: 34.4 g/dL (ref 31.5–35.7)
MCV: 91 fL (ref 79–97)
Monocytes Absolute: 1 10*3/uL — ABNORMAL HIGH (ref 0.1–0.9)
Monocytes: 10 %
Neutrophils Absolute: 4.9 10*3/uL (ref 1.4–7.0)
Neutrophils: 54 %
Platelets: 213 10*3/uL (ref 150–450)
RBC: 4.7 x10E6/uL (ref 4.14–5.80)
RDW: 12.5 % (ref 11.6–15.4)
WBC: 9.3 10*3/uL (ref 3.4–10.8)

## 2020-02-06 LAB — BMP8+EGFR
BUN/Creatinine Ratio: 13 (ref 9–20)
BUN: 12 mg/dL (ref 6–24)
CO2: 25 mmol/L (ref 20–29)
Calcium: 9.4 mg/dL (ref 8.7–10.2)
Chloride: 100 mmol/L (ref 96–106)
Creatinine, Ser: 0.9 mg/dL (ref 0.76–1.27)
GFR calc Af Amer: 118 mL/min/{1.73_m2} (ref >59)
GFR calc non Af Amer: 102 mL/min/{1.73_m2} (ref >59)
Glucose: 123 mg/dL — ABNORMAL HIGH (ref 65–99)
Potassium: 4.4 mmol/L (ref 3.5–5.2)
Sodium: 139 mmol/L (ref 134–144)

## 2020-04-22 ENCOUNTER — Ambulatory Visit: Payer: BC Managed Care – PPO | Admitting: Family Medicine

## 2020-04-23 ENCOUNTER — Encounter: Payer: Self-pay | Admitting: Family Medicine

## 2020-06-06 ENCOUNTER — Ambulatory Visit: Payer: BC Managed Care – PPO | Admitting: Cardiology

## 2020-06-06 NOTE — Progress Notes (Signed)
Cardiology Office Note  Date: 06/07/2020   ID: Carl [name], DOB [birth year], MRN [id number]  PCP:  Dettinger, Fransisca Kaufmann, MD  Cardiologist:  Carlyle Dolly, MD Electrophysiologist:  None   Chief Complaint: Cardiac follow up / DOT physical  History of Present Illness:  Carl [name] is a 45 y.o. male with a history of CAD, HLD, HTN.  Hx of abnormal exercise stress test 2015.  Cardiac cath 2015. Occluded proximal LAD with left to left and right to left collaterals. Managed medically.  Echo 2018 EF 55-60%. Nuclear stress 2018 apical and distal anterolateral infarct with ischemia intermediate to high risk, Cath 06/2017 : Prox LAD 100%, [place] RCA 50%, RPDA 40%, LCx normal. Cardiac cath 08/2017 : CTO LAD with PCI and DES x 2. 08/2020. Exercise treadmill stress test no ischemia .Duke treadmill score 6.5  Last seen by Dr Harl Bowie via telemedicine visit 07/11/2019. No recent CP/ SOB / DOE.  He was compliant with medical therapy. LDL 55 at goal and compliant with statin therapy. Compliant with anti-hypertensive medication and at goal.   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  He denies any recent significant anginal or exertional symptoms, orthostatic symptoms i.e. lightheadedness, dizziness, presyncope or syncopal episodes.,  No PND, orthopnea, lower extremity edema, DVT or PE-like symptoms, claudication-like symptoms, CVA or TIA-like symptoms.  No palpitations or arrhythmias.  Blood pressure well controlled today at 130/78.  EKG today shows normal sinus rhythm with a rate of 93.  No ectopy noted.    Past Medical History:  Diagnosis Date  . Anxiety   . CAD in native artery    a. cath in 2015 [facility] showing occluded proximal LAD, LAD filled with left-left and right-left collaterals, otherwise 20-30% prox-mid RCA, LVEF  55%. b. Abnormal nuc 02/2016 - [facility] medically. c. redo cath in 06/2017 showing 100% Prox LAD stenosis, 50% RCA, and 40% RPDA and underwent CTO PCI of the LAD with DESx2 to in 08/2017  . Daily headache    "since I started Plavix" (08/25/2017)  . Depression   . Diabetes mellitus type 2 in obese ([place])   . Hyperlipidemia   . Hypertension   . OSA on CPAP     Past Surgical History:  Procedure Laterality Date  . CORONARY CTO INTERVENTION N/A 08/25/2017   Procedure: CORONARY CTO INTERVENTION;  Surgeon: Martinique, Peter M, MD;  Location: [place];  Service: Cardiovascular;  Laterality: N/A;  . LEFT HEART CATH AND CORONARY ANGIOGRAPHY N/A 07/21/2017   Procedure: LEFT HEART CATH AND CORONARY ANGIOGRAPHY;  Surgeon: Belva Crome, MD;  Location: [facility];  Service: Cardiovascular;  Laterality: N/A;  . MOLE REMOVAL Right    "chin"  . TUMOR EXCISION Left 1991   knee area    Current Outpatient Medications  Medication Sig Dispense Refill  . Ascorbic Acid (VITAMIN C) 1000 MG tablet Take 2,000 mg by mouth daily.    Marland Kitchen aspirin EC 81 MG tablet Take 81 mg by mouth daily.     Marland Kitchen atorvastatin (LIPITOR) 80 MG tablet Take 1 tablet (80 mg total) by mouth daily. 90 tablet 3  . lisinopril (ZESTRIL) 5 MG tablet Take 1 tablet (5 mg total) by mouth daily. 90 tablet 3  . metoprolol succinate (TOPROL-XL) 50 MG 24 hr tablet Take  1 tablet (50 mg total) by mouth daily. Take with or immediately following a meal. 90 tablet 3  . nitroGLYCERIN (NITROSTAT) 0.4 MG SL tablet Place 1 tablet (0.4 mg total) under the tongue every 5 (five) minutes as needed for chest pain. 25 tablet 3  . Semaglutide (RYBELSUS) 7 MG TABS Take 7 mg by mouth daily at 12 noon. 90 tablet 3  . metFORMIN (GLUCOPHAGE XR) 500 MG 24 hr tablet Take 2 tablets (1,000 mg total) by mouth in the morning and at bedtime. (Patient taking differently: Takes all 4 tabs by mouth at one time daily) 360 tablet 3   No current facility-administered medications  for this visit.   Allergies:  Patient has no known allergies.   Social History: The patient  reports that he has been smoking cigarettes. He started smoking about 27 years ago. He has a 48.00 pack-year smoking history. He has never used smokeless tobacco. He reports current alcohol use. He reports that he does not use drugs.   Family History: The patient's family history includes Cancer in his mother; Diabetes in his father and mother.   ROS:  Please see the history of present illness. Otherwise, complete review of systems is positive for none.  All other systems are reviewed and negative.   Physical Exam: VS:  BP 130/78   Pulse 88   Ht 5\' 11"  (1.803 m)   Wt 294 lb (133.4 kg)   SpO2 94%   BMI 41.00 kg/m , BMI Body mass index is 41 kg/m.  Wt Readings from Last 3 Encounters:  06/07/20 294 lb (133.4 kg)  01/15/20 290 lb (131.5 kg)  10/12/19 290 lb 3.2 oz (131.6 kg)    General: Obese patient appears comfortable at rest. Neck: Supple, no elevated JVP or carotid bruits, no thyromegaly. Lungs: Clear to auscultation, nonlabored breathing at rest. Cardiac: Regular rate and rhythm, no S3 or significant systolic murmur, no pericardial rub. Extremities: No pitting edema, distal pulses 2+. Skin: Warm and dry. Musculoskeletal: No kyphosis. Neuropsychiatric: Alert and oriented x3, affect grossly appropriate.  ECG:  An ECG dated 06/07/2020 was personally reviewed today and demonstrated:  Normal sinus rhythm rate of 93.  Recent Labwork: 01/15/2020: ALT 11; AST 15 02/05/2020: BUN 12; Creatinine, Ser 0.90; Hemoglobin 14.7; Platelets 213; Potassium 4.4; Sodium 139     Component Value Date/Time   CHOL 128 01/15/2020 1302   TRIG 276 (H) 01/15/2020 1302   HDL 33 (L) 01/15/2020 1302   CHOLHDL 3.9 01/15/2020 1302   LDLCALC 52 01/15/2020 1302    Other Studies Reviewed Today:   Exercise treadmill stress test 09/16/2018 Study Highlights   No diagnostic ST segment changes to indicate  ischemia. Maximum workload 8.7 METS. No chest pain reported. Hypertensive response. No arrhythmias. Low risk Duke treadmill score of 6.5.  Blood pressure demonstrated a hypertensive response to exercise.      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  Left to right and right to left collaterals are noted.  Widely patent left main  Normal circumflex coronary artery  Moderately diseased right coronary with eccentric 50 to 60% proximal to mid lesion 40% eccentric distal lesion and 40% ostial PDA stenosis.  Abnormal left ventricular function with anteroapical hypokinesis.  EF 45 to 50%.  Normal filling pressures.  RECOMMENDATIONS:   Aggressive risk factor modification: Discussed smoking cessation, better control of diabetes, diet, management of  sleep apnea.  Consider for CTO LAD.  Will refer the patient to the CTO team.  Treatment considerations include CTO LAD if possible followed by FFR and possibly stenting of the RCA.  If LAD recanalization is not possible, coronary bypass grafting with LIMA to LAD and grafting of diagonal and right coronary will be an alternative consideration given impairment in LV function  Diagnostic Dominance: Right      Dominance: Right     08/25/2017 CORONARY CTO INTERVENTION    Conclusion    Prox LAD lesion is 100% stenosed.  A drug-eluting stent was successfully placed using a STENT SYNERGY DES 2.5X38.  A drug-eluting stent was successfully placed using a STENT SYNERGY DES 2.5X16.  Post intervention, there is a 0% residual stenosis.  Ost 1st Diag to 1st Diag lesion is 100% stenosed.   1. Successful CTO PCI of the LAD with DES x 2.  Recommend uninterrupted dual antiplatelet therapy with Aspirin 81mg  daily and Clopidogrel 75mg  daily for a minimum of 12 months (ACS - Class I recommendation).  Anticipate DC in am.    Diagnostic Dominance: Right    Intervention     Assessment and Plan:  1. CAD in native artery   2. Mixed hyperlipidemia   3. Essential hypertension    1. CAD in native artery No recent anginal or exertional symptoms.  Continue aspirin 81 mg daily.  Sublingual nitroglycerin as needed.  Toprol-XL 50 mg daily.  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  2. Mixed hyperlipidemia Continue atorvastatin 80 mg p.o. daily.  Last lipid panel 01/15/2020: TC 128, HDL 33, triglycerides 276, LDL 52.  3. Essential hypertension Blood pressure well controlled today at 130/78. Continue lisinopril 5 mg daily.  Continue Toprol-XL 50 mg daily.  Medication Adjustments/Labs and Tests Ordered: Current medicines are reviewed at length with the patient today.  Concerns regarding medicines are outlined above.   Disposition: Follow-up with Dr. Harl Bowie or APP 1 year  Signed, Levell July, NP 06/07/2020 2:26 PM    [facility], [place], [place] [address] Phone: [phone]; Fax: [phone]

## 2020-06-06 NOTE — Progress Notes (Deleted)
Clinical Summary Mr. Antigua is a 45 y.o.male seen today for follow up of the following medical problem.s  1. CAD - abnormal exericse stress in 2015. - cath 2015 at [facility] with occluded proximal LAD, LAD filled with left-left and right-left collaterals. Medically managed - echo Jan 2018 LVEF 55-60%   - nuclear stress Jan 2018 showed apical and distal anterolateral infarct with ischemia. Overall intermediate to high risk.   06/2017 cath with prox LAD 100%, RCA prox 50%, RPDA 40%, norma LCX - he was referred for evaluation for CTO intervention on his LAD - 08/2017 succesful CTO PCI of LAD with DES x 2.  Imdur stopped that admission due to headaches.    - no recent chest pain. No SOB or DOE - compliant with meds. Does treadmill x 10-15 minutes brisk walk without troubles. - lisionpril previously stopped to allow room to tirate antianginalsprior to his most recent cath.   08/2018 GXT no ischemia, duke treadmill score 6.5  - no recent chest pain. No SOB or DOE - compliant with meds    2. Hyperlipidemia -05/2019 TC 116 TG 208 HDL 27 LDL 55 - compliant with statin  3. HTN - compliant with meds - 5/11 visit at [facility] bp 107/68    SH: completed covid vaccine x 2   Past Medical History:  Diagnosis Date  . Anxiety   . CAD in native artery    a. cath in 2015 [facility] showing occluded proximal LAD, LAD filled with left-left and right-left collaterals, otherwise 20-30% prox-mid RCA, LVEF 55%. b. Abnormal nuc 02/2016 - [facility] medically. c. redo cath in 06/2017 showing 100% Prox LAD stenosis, 50% RCA, and 40% RPDA and underwent CTO PCI of the LAD with DESx2 to in 08/2017  . Daily headache    "since I started Plavix" (08/25/2017)  . Depression   . Diabetes mellitus type 2 in obese ([place])   . Hyperlipidemia   . Hypertension   . OSA on CPAP      No Known Allergies   Current Outpatient Medications  Medication Sig Dispense Refill  . Ascorbic Acid (VITAMIN  C) 1000 MG tablet Take 2,000 mg by mouth daily.    Marland Kitchen aspirin EC 81 MG tablet Take 81 mg by mouth daily.     Marland Kitchen atorvastatin (LIPITOR) 80 MG tablet Take 1 tablet (80 mg total) by mouth daily. 90 tablet 3  . lisinopril (ZESTRIL) 5 MG tablet Take 1 tablet (5 mg total) by mouth daily. 90 tablet 3  . metFORMIN (GLUCOPHAGE XR) 500 MG 24 hr tablet Take 2 tablets (1,000 mg total) by mouth in the morning and at bedtime. 360 tablet 3  . metoprolol succinate (TOPROL-XL) 50 MG 24 hr tablet Take 1 tablet (50 mg total) by mouth daily. Take with or immediately following a meal. 90 tablet 3  . nitroGLYCERIN (NITROSTAT) 0.4 MG SL tablet Place 1 tablet (0.4 mg total) under the tongue every 5 (five) minutes as needed for chest pain. 25 tablet 3  . Semaglutide (RYBELSUS) 7 MG TABS Take 7 mg by mouth daily at 12 noon. 90 tablet 3   No current facility-administered medications for this visit.     Past Surgical History:  Procedure Laterality Date  . CORONARY CTO INTERVENTION N/A 08/25/2017   Procedure: CORONARY CTO INTERVENTION;  Surgeon: Martinique, Peter M, MD;  Location: [facility];  Service: Cardiovascular;  Laterality: N/A;  . LEFT HEART CATH AND CORONARY ANGIOGRAPHY N/A 07/21/2017   Procedure: LEFT  HEART CATH AND CORONARY ANGIOGRAPHY;  Surgeon: Belva Crome, MD;  Location: [facility];  Service: Cardiovascular;  Laterality: N/A;  . MOLE REMOVAL Right    "chin"  . TUMOR EXCISION Left 1991   knee area     No Known Allergies    Family History  Problem Relation Age of Onset  . Diabetes Mother   . Cancer Mother   . Diabetes Father      Social History Mr. Garraway reports that he has been smoking cigarettes. He started smoking about 27 years ago. He has a 48.00 pack-year smoking history. He has never used smokeless tobacco. Mr. Leavitt reports current alcohol use.   Review of Systems CONSTITUTIONAL: No weight loss, fever, chills, weakness or fatigue.  HEENT: Eyes: No visual loss, blurred  vision, double vision or yellow sclerae.No hearing loss, sneezing, congestion, runny nose or sore throat.  SKIN: No rash or itching.  CARDIOVASCULAR:  RESPIRATORY: No shortness of breath, cough or sputum.  GASTROINTESTINAL: No anorexia, nausea, vomiting or diarrhea. No abdominal pain or blood.  GENITOURINARY: No burning on urination, no polyuria NEUROLOGICAL: No headache, dizziness, syncope, paralysis, ataxia, numbness or tingling in the extremities. No change in bowel or bladder control.  MUSCULOSKELETAL: No muscle, back pain, joint pain or stiffness.  LYMPHATICS: No enlarged nodes. No history of splenectomy.  PSYCHIATRIC: No history of depression or anxiety.  ENDOCRINOLOGIC: No reports of sweating, cold or heat intolerance. No polyuria or polydipsia.  Marland Kitchen   Physical Examination There were no vitals filed for this visit. There were no vitals filed for this visit.  Gen: resting comfortably, no acute distress HEENT: no scleral icterus, pupils equal round and reactive, no palptable cervical adenopathy,  CV Resp: Clear to auscultation bilaterally GI: abdomen is soft, non-tender, non-distended, normal bowel sounds, no hepatosplenomegaly MSK: extremities are warm, no edema.  Skin: warm, no rash Neuro:  no focal deficits Psych: appropriate affect   Diagnostic Studies     Assessment and Plan  1. CAD - no symptoms, continue current meds  2. Hyperlipidema - LDL is at goal, we discussed lifestyle modifications to improve HDL and TGs  3. HTN -at goal based on recent clinic visits with other providers - continue current meds      Arnoldo Lenis, M.D., F.A.C.C.

## 2020-06-07 ENCOUNTER — Encounter: Payer: Self-pay | Admitting: Family Medicine

## 2020-06-07 ENCOUNTER — Encounter: Payer: Self-pay | Admitting: *Deleted

## 2020-06-07 ENCOUNTER — Other Ambulatory Visit: Payer: Self-pay

## 2020-06-07 ENCOUNTER — Ambulatory Visit (INDEPENDENT_AMBULATORY_CARE_PROVIDER_SITE_OTHER): Payer: BC Managed Care – PPO | Admitting: Family Medicine

## 2020-06-07 VITALS — BP 130/78 | HR 88 | Ht 71.0 in | Wt 294.0 lb

## 2020-06-07 DIAGNOSIS — I2510 Atherosclerotic heart disease of native coronary artery without angina pectoris: Secondary | ICD-10-CM | POA: Diagnosis not present

## 2020-06-07 DIAGNOSIS — E782 Mixed hyperlipidemia: Secondary | ICD-10-CM | POA: Diagnosis not present

## 2020-06-07 DIAGNOSIS — Z0289 Encounter for other administrative examinations: Secondary | ICD-10-CM | POA: Diagnosis not present

## 2020-06-07 DIAGNOSIS — I10 Essential (primary) hypertension: Secondary | ICD-10-CM

## 2020-06-07 NOTE — Addendum Note (Signed)
Addended by: Julian Hy T on: 06/07/2020 03:14 PM   Modules accepted: Orders

## 2020-06-07 NOTE — Patient Instructions (Signed)
Medication Instructions:  Continue all current medications.  Labwork: none  Testing/Procedures:  Your physician has requested that you have an exercise tolerance test. For further information please visit [email or web address]. Please also follow instruction sheet, as given.  Office will contact with results via phone or letter.    Follow-Up: Your physician wants you to follow up in:  1 year.  You will receive a reminder letter in the mail one-two months in advance.  If you don't receive a letter, please call our office to schedule the follow up appointment.    Any Other Special Instructions Will Be Listed Below (If Applicable).  If you need a refill on your cardiac medications before your next appointment, please call your pharmacy.

## 2020-06-14 ENCOUNTER — Ambulatory Visit: Payer: BC Managed Care – PPO | Admitting: Family Medicine

## 2020-06-17 ENCOUNTER — Ambulatory Visit: Payer: BC Managed Care – PPO | Admitting: Cardiology

## 2020-06-19 ENCOUNTER — Other Ambulatory Visit (HOSPITAL_COMMUNITY)
Admission: RE | Admit: 2020-06-19 | Discharge: 2020-06-19 | Disposition: A | Discharge status: Home / Self Care | Payer: BC Managed Care – PPO | Source: Ambulatory Visit | Attending: Family Medicine | Admitting: Family Medicine

## 2020-06-19 ENCOUNTER — Other Ambulatory Visit: Payer: Self-pay

## 2020-06-19 DIAGNOSIS — Z01812 Encounter for preprocedural laboratory examination: Secondary | ICD-10-CM | POA: Insufficient documentation

## 2020-06-19 DIAGNOSIS — Z20822 Contact with and (suspected) exposure to covid-19: Secondary | ICD-10-CM | POA: Diagnosis not present

## 2020-06-19 LAB — SARS CORONAVIRUS 2 (TAT 6-24 HRS): SARS Coronavirus 2: NEGATIVE

## 2020-06-19 NOTE — Progress Notes (Signed)
Patient came in, I swabbed him, Covid order was automatically canceled,created another order, released it, and printed requisition. Patient has to have a Covid test before his procedure.

## 2020-06-20 ENCOUNTER — Other Ambulatory Visit: Payer: Self-pay

## 2020-06-20 ENCOUNTER — Ambulatory Visit (HOSPITAL_COMMUNITY)
Admission: RE | Admit: 2020-06-20 | Discharge: 2020-06-20 | Disposition: A | Discharge status: Home / Self Care | Payer: BC Managed Care – PPO | Source: Ambulatory Visit | Attending: Family Medicine | Admitting: Family Medicine

## 2020-06-20 DIAGNOSIS — Z0289 Encounter for other administrative examinations: Secondary | ICD-10-CM | POA: Diagnosis present

## 2020-06-20 LAB — EXERCISE TOLERANCE TEST
Estimated workload: 7 METS
Exercise duration (min): 5 min
Exercise duration (sec): 20 s
MPHR: 174 {beats}/min
Peak HR: 151 {beats}/min
Percent HR: 86 %
RPE: 12
Rest HR: 86 {beats}/min

## 2020-06-27 NOTE — Progress Notes (Signed)
Yes.  Thank you.

## 2020-07-01 ENCOUNTER — Telehealth: Payer: Self-pay | Admitting: *Deleted

## 2020-07-01 NOTE — Telephone Encounter (Signed)
Laurine Blazer, LPN  08/30/6752 4:92 PM EDT Back to Top     Notified, copy to pcp.    Laurine Blazer, LPN  0/02/69 2:19 AM EDT      Left message to return call.   Verta Ellen., NP  06/30/2020 2:16 PM EDT      He is clear.   Laurine Blazer, LPN  08/28/8830 5:49 PM EDT      Please advise. 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   Verta Ellen., NP  06/21/2020 8:08 AM EDT      Please call the patient let him know the treadmill stress test was considered an intermediate risk test by Dr Domenic Polite. He had a hypertensive response to exercise. I believe this was for a DOT clearance purpose.

## 2020-07-03 ENCOUNTER — Telehealth: Payer: Self-pay

## 2020-07-03 ENCOUNTER — Other Ambulatory Visit: Payer: Self-pay

## 2020-07-03 ENCOUNTER — Other Ambulatory Visit: Payer: BC Managed Care – PPO

## 2020-07-03 DIAGNOSIS — E1169 Type 2 diabetes mellitus with other specified complication: Secondary | ICD-10-CM

## 2020-07-03 DIAGNOSIS — E118 Type 2 diabetes mellitus with unspecified complications: Secondary | ICD-10-CM

## 2020-07-03 DIAGNOSIS — E785 Hyperlipidemia, unspecified: Secondary | ICD-10-CM

## 2020-07-03 DIAGNOSIS — E1159 Type 2 diabetes mellitus with other circulatory complications: Secondary | ICD-10-CM

## 2020-07-03 DIAGNOSIS — I152 Hypertension secondary to endocrine disorders: Secondary | ICD-10-CM

## 2020-07-03 LAB — BAYER DCA HB A1C WAIVED: HB A1C (BAYER DCA - WAIVED): 9 % — ABNORMAL HIGH (ref <7.0)

## 2020-07-03 NOTE — Telephone Encounter (Signed)
Orders have been placed.

## 2020-07-04 ENCOUNTER — Encounter: Payer: Self-pay | Admitting: Family Medicine

## 2020-07-04 ENCOUNTER — Ambulatory Visit: Payer: BC Managed Care – PPO | Admitting: Family Medicine

## 2020-07-04 VITALS — BP 137/83 | HR 101 | Ht 71.0 in | Wt 291.0 lb

## 2020-07-04 DIAGNOSIS — E785 Hyperlipidemia, unspecified: Secondary | ICD-10-CM

## 2020-07-04 DIAGNOSIS — E1159 Type 2 diabetes mellitus with other circulatory complications: Secondary | ICD-10-CM

## 2020-07-04 DIAGNOSIS — I152 Hypertension secondary to endocrine disorders: Secondary | ICD-10-CM | POA: Diagnosis not present

## 2020-07-04 DIAGNOSIS — E1169 Type 2 diabetes mellitus with other specified complication: Secondary | ICD-10-CM

## 2020-07-04 LAB — CBC WITH DIFFERENTIAL/PLATELET
Basophils Absolute: 0 10*3/uL (ref 0.0–0.2)
Basos: 0 %
EOS (ABSOLUTE): 0.2 10*3/uL (ref 0.0–0.4)
Eos: 2 %
Hematocrit: 44.6 % (ref 37.5–51.0)
Hemoglobin: 15.3 g/dL (ref 13.0–17.7)
Immature Grans (Abs): 0 10*3/uL (ref 0.0–0.1)
Immature Granulocytes: 0 %
Lymphocytes Absolute: 3.2 10*3/uL — ABNORMAL HIGH (ref 0.7–3.1)
Lymphs: 31 %
MCH: 31.1 pg (ref 26.6–33.0)
MCHC: 34.3 g/dL (ref 31.5–35.7)
MCV: 91 fL (ref 79–97)
Monocytes Absolute: 0.8 10*3/uL (ref 0.1–0.9)
Monocytes: 8 %
Neutrophils Absolute: 5.9 10*3/uL (ref 1.4–7.0)
Neutrophils: 59 %
Platelets: 225 10*3/uL (ref 150–450)
RBC: 4.92 x10E6/uL (ref 4.14–5.80)
RDW: 12.2 % (ref 11.6–15.4)
WBC: 10.1 10*3/uL (ref 3.4–10.8)

## 2020-07-04 LAB — LIPID PANEL
Chol/HDL Ratio: 4.8 ratio (ref 0.0–5.0)
Cholesterol, Total: 111 mg/dL (ref 100–199)
HDL: 23 mg/dL — ABNORMAL LOW (ref >39)
LDL Chol Calc (NIH): 42 mg/dL (ref 0–99)
Triglycerides: 301 mg/dL — ABNORMAL HIGH (ref 0–149)
VLDL Cholesterol Cal: 46 mg/dL — ABNORMAL HIGH (ref 5–40)

## 2020-07-04 LAB — CMP14+EGFR
ALT: 41 IU/L (ref 0–44)
AST: 27 IU/L (ref 0–40)
Albumin/Globulin Ratio: 1.6 (ref 1.2–2.2)
Albumin: 4.4 g/dL (ref 4.0–5.0)
Alkaline Phosphatase: 82 IU/L (ref 44–121)
BUN/Creatinine Ratio: 14 (ref 9–20)
BUN: 13 mg/dL (ref 6–24)
Bilirubin Total: 0.6 mg/dL (ref 0.0–1.2)
CO2: 22 mmol/L (ref 20–29)
Calcium: 9.4 mg/dL (ref 8.7–10.2)
Chloride: 98 mmol/L (ref 96–106)
Creatinine, Ser: 0.94 mg/dL (ref 0.76–1.27)
Globulin, Total: 2.8 g/dL (ref 1.5–4.5)
Glucose: 196 mg/dL — ABNORMAL HIGH (ref 65–99)
Potassium: 4.5 mmol/L (ref 3.5–5.2)
Sodium: 138 mmol/L (ref 134–144)
Total Protein: 7.2 g/dL (ref 6.0–8.5)
eGFR: 101 mL/min/{1.73_m2} (ref >59)

## 2020-07-04 MED ORDER — TRULICITY 3 MG/0.5ML ~~LOC~~ SOAJ: 3.0000 mg | SUBCUTANEOUS | 3 refills | Status: DC

## 2020-07-04 NOTE — Progress Notes (Signed)
BP 137/83   Pulse (!) 101   Ht '5\' 11"'  (1.803 m)   Wt 291 lb (132 kg)   SpO2 94%   BMI 40.59 kg/m    Subjective:   Patient ID: Carl [name], male    DOB: December 04, 1975, 45 y.o.   MRN: [id number]  HPI: Carl [name] is a 45 y.o. male presenting on 07/04/2020 for Medical Management of Chronic Issues and Diabetes   HPI Type 2 diabetes mellitus Patient comes in today for recheck of his diabetes. Patient has been currently taking metformin and Rybelsus. Patient is currently on an ACE inhibitor/ARB. Patient has not seen an ophthalmologist this year. Patient denies any issues with their feet. The symptom started onset as an adult hypertension hyperlipidemia ARE RELATED TO DM   Hypertension Patient is currently on lisinopril and metoprolol, and their blood pressure today is 137/83. Patient denies any lightheadedness or dizziness. Patient denies headaches, blurred vision, chest pains, shortness of breath, or weakness. Denies any side effects from medication and is content with current medication.   Hyperlipidemia Patient is coming in for recheck of his hyperlipidemia. The patient is currently taking atorvastatin. They deny any issues with myalgias or history of liver damage from it. They deny any focal numbness or weakness or chest pain.   Relevant past medical, surgical, family and social history reviewed and updated as indicated. Interim medical history since our last visit reviewed. Allergies and medications reviewed and updated.  Review of Systems  Constitutional: Negative for chills and fever.  Respiratory: Negative for shortness of breath and wheezing.   Cardiovascular: Negative for chest pain and leg swelling.  Musculoskeletal: Negative for back pain and gait problem.  Skin: Negative for rash.  Psychiatric/Behavioral: Negative for dysphoric mood, self-injury, sleep disturbance and suicidal ideas. The patient is not nervous/anxious.   All other systems reviewed and are  negative.   Per HPI unless specifically indicated above   Allergies as of 07/04/2020   No Known Allergies     Medication List       Accurate as of Jul 04, 2020  1:10 PM. If you have any questions, ask your nurse or doctor.        aspirin EC 81 MG tablet Take 81 mg by mouth daily.   atorvastatin 80 MG tablet Commonly known as: LIPITOR Take 1 tablet (80 mg total) by mouth daily.   lisinopril 5 MG tablet Commonly known as: ZESTRIL Take 1 tablet (5 mg total) by mouth daily.   metFORMIN 500 MG 24 hr tablet Commonly known as: Glucophage XR Take 2 tablets (1,000 mg total) by mouth in the morning and at bedtime. What changed:   how much to take  how to take this  when to take this  additional instructions   metoprolol succinate 50 MG 24 hr tablet Commonly known as: TOPROL-XL Take 1 tablet (50 mg total) by mouth daily. Take with or immediately following a meal.   nitroGLYCERIN 0.4 MG SL tablet Commonly known as: NITROSTAT Place 1 tablet (0.4 mg total) under the tongue every 5 (five) minutes as needed for chest pain.   Rybelsus 7 MG Tabs Generic drug: Semaglutide Take 7 mg by mouth daily at 12 noon.   vitamin C 1000 MG tablet Take 2,000 mg by mouth daily.        Objective:   BP 137/83   Pulse (!) 101   Ht '5\' 11"'  (1.803 m)   Wt 291 lb (132 kg)   SpO2 94%  BMI 40.59 kg/m   Wt Readings from Last 3 Encounters:  07/04/20 291 lb (132 kg)  06/07/20 294 lb (133.4 kg)  01/15/20 290 lb (131.5 kg)    Physical Exam Vitals and nursing note reviewed.  Constitutional:      General: He is not in acute distress.    Appearance: He is well-developed. He is not diaphoretic.  Eyes:     General: No scleral icterus.    Conjunctiva/sclera: Conjunctivae normal.  Neck:     Thyroid: No thyromegaly.  Cardiovascular:     Rate and Rhythm: Normal rate and regular rhythm.     Heart sounds: Normal heart sounds. No murmur heard.   Pulmonary:     Effort: Pulmonary effort  is normal. No respiratory distress.     Breath sounds: Normal breath sounds. No wheezing.  Musculoskeletal:        General: Normal range of motion.     Cervical back: Neck supple.  Lymphadenopathy:     Cervical: No cervical adenopathy.  Skin:    General: Skin is warm and dry.     Findings: No rash.  Neurological:     Mental Status: He is alert and oriented to person, place, and time.     Coordination: Coordination normal.  Psychiatric:        Behavior: Behavior normal.     Results for orders placed or performed in visit on 07/03/20  Bayer DCA Hb A1c Waived  Result Value Ref Range   HB A1C (BAYER DCA - WAIVED) 9.0 (H) <7.0 %  Lipid panel  Result Value Ref Range   Cholesterol, Total 111 100 - 199 mg/dL   Triglycerides 301 (H) 0 - 149 mg/dL   HDL 23 (L) >39 mg/dL   VLDL Cholesterol Cal 46 (H) 5 - 40 mg/dL   LDL Chol Calc (NIH) 42 0 - 99 mg/dL   Chol/HDL Ratio 4.8 0.0 - 5.0 ratio  CMP14+EGFR  Result Value Ref Range   Glucose 196 (H) 65 - 99 mg/dL   BUN 13 6 - 24 mg/dL   Creatinine, Ser 0.94 0.76 - 1.27 mg/dL   eGFR 101 >59 mL/min/1.73   BUN/Creatinine Ratio 14 9 - 20   Sodium 138 134 - 144 mmol/L   Potassium 4.5 3.5 - 5.2 mmol/L   Chloride 98 96 - 106 mmol/L   CO2 22 20 - 29 mmol/L   Calcium 9.4 8.7 - 10.2 mg/dL   Total Protein 7.2 6.0 - 8.5 g/dL   Albumin 4.4 4.0 - 5.0 g/dL   Globulin, Total 2.8 1.5 - 4.5 g/dL   Albumin/Globulin Ratio 1.6 1.2 - 2.2   Bilirubin Total 0.6 0.0 - 1.2 mg/dL   Alkaline Phosphatase 82 44 - 121 IU/L   AST 27 0 - 40 IU/L   ALT 41 0 - 44 IU/L  CBC with Differential/Platelet  Result Value Ref Range   WBC 10.1 3.4 - 10.8 x10E3/uL   RBC 4.92 4.14 - 5.80 x10E6/uL   Hemoglobin 15.3 13.0 - 17.7 g/dL   Hematocrit 44.6 37.5 - 51.0 %   MCV 91 79 - 97 fL   MCH 31.1 26.6 - 33.0 pg   MCHC 34.3 31.5 - 35.7 g/dL   RDW 12.2 11.6 - 15.4 %   Platelets 225 150 - 450 x10E3/uL   Neutrophils 59 Not Estab. %   Lymphs 31 Not Estab. %   Monocytes 8 Not  Estab. %   Eos 2 Not Estab. %   Basos 0 Not  Estab. %   Neutrophils Absolute 5.9 1.4 - 7.0 x10E3/uL   Lymphocytes Absolute 3.2 (H) 0.7 - 3.1 x10E3/uL   Monocytes Absolute 0.8 0.1 - 0.9 x10E3/uL   EOS (ABSOLUTE) 0.2 0.0 - 0.4 x10E3/uL   Basophils Absolute 0.0 0.0 - 0.2 x10E3/uL   Immature Granulocytes 0 Not Estab. %   Immature Grans (Abs) 0.0 0.0 - 0.1 x10E3/uL    Assessment & Plan:   Problem List Items Addressed This Visit      Cardiovascular and Mediastinum   Hypertension associated with diabetes ([place])   Relevant Medications   Dulaglutide (TRULICITY) 3 UK/0.2RK SOPN     Endocrine   Hyperlipidemia associated with type 2 diabetes mellitus ([place])   Relevant Medications   Dulaglutide (TRULICITY) 3 YH/0.6CB SOPN   Controlled diabetes mellitus type 2 with complications (HCC) - Primary   Relevant Medications   Dulaglutide (TRULICITY) 3 JS/2.8BT SOPN      A1c is up to 9.0, will have him double up on the Rybelsus to 14 mg and finish it out when he has left and then go to Trulicity 3.0 mg.  The rest of his blood work looks good, no other change in medication.  Patient still has depression every day but does not want medication for it. Follow up plan: Return in about 3 months (around 10/04/2020), or if symptoms worsen or fail to improve, for Diabetes recheck.  Counseling provided for all of the vaccine components No orders of the defined types were placed in this encounter.   Caryl Pina, MD [facility] 07/04/2020, 1:10 PM

## 2020-07-17 ENCOUNTER — Other Ambulatory Visit: Payer: Self-pay | Admitting: Cardiology

## 2020-08-25 ENCOUNTER — Other Ambulatory Visit: Payer: Self-pay | Admitting: Cardiology

## 2020-10-03 ENCOUNTER — Ambulatory Visit: Payer: BC Managed Care – PPO | Admitting: Family Medicine

## 2020-10-03 ENCOUNTER — Other Ambulatory Visit: Payer: Self-pay

## 2020-10-03 ENCOUNTER — Encounter: Payer: Self-pay | Admitting: Family Medicine

## 2020-10-03 VITALS — BP 118/76 | HR 75 | Ht 71.0 in | Wt 294.0 lb

## 2020-10-03 DIAGNOSIS — E1159 Type 2 diabetes mellitus with other circulatory complications: Secondary | ICD-10-CM

## 2020-10-03 DIAGNOSIS — E118 Type 2 diabetes mellitus with unspecified complications: Secondary | ICD-10-CM

## 2020-10-03 DIAGNOSIS — E785 Hyperlipidemia, unspecified: Secondary | ICD-10-CM | POA: Diagnosis not present

## 2020-10-03 DIAGNOSIS — E1169 Type 2 diabetes mellitus with other specified complication: Secondary | ICD-10-CM

## 2020-10-03 DIAGNOSIS — I152 Hypertension secondary to endocrine disorders: Secondary | ICD-10-CM

## 2020-10-03 LAB — BAYER DCA HB A1C WAIVED: HB A1C (BAYER DCA - WAIVED): 7.4 % — ABNORMAL HIGH (ref <7.0)

## 2020-10-03 MED ORDER — METFORMIN HCL ER 500 MG PO TB24: 1000.0000 mg | ORAL_TABLET | Freq: Two times a day (BID) | ORAL | 3 refills | Status: DC

## 2020-10-03 NOTE — Progress Notes (Signed)
BP 118/76   Pulse 75   Ht '5\' 11"'$  (1.803 m)   Wt 294 lb (133.4 kg)   SpO2 96%   BMI 41.00 kg/m    Subjective:   Patient ID: Carl [name], male    DOB: January 15, 1976, 45 y.o.   MRN: [id number]  HPI: Carl [name] is a 45 y.o. male presenting on 10/03/2020 for Medical Management of Chronic Issues and Diabetes   HPI Type 2 diabetes mellitus Patient comes in today for recheck of his diabetes. Patient has been currently taking metformin and Trulicity, he gets a little stomach upset with the Trulicity. Patient is currently on an ACE inhibitor/ARB. Patient has not seen an ophthalmologist this year. Patient denies any issues with their feet. The symptom started onset as an adult hypertension and hyperlipidemia ARE RELATED TO DM   Hypertension Patient is currently on lisinopril and metoprolol, and their blood pressure today is 118/76. Patient denies any lightheadedness or dizziness. Patient denies headaches, blurred vision, chest pains, shortness of breath, or weakness. Denies any side effects from medication and is content with current medication.   Hyperlipidemia Patient is coming in for recheck of his hyperlipidemia. The patient is currently taking atorvastatin. They deny any issues with myalgias or history of liver damage from it. They deny any focal numbness or weakness or chest pain.   Relevant past medical, surgical, family and social history reviewed and updated as indicated. Interim medical history since our last visit reviewed. Allergies and medications reviewed and updated.  Review of Systems  Constitutional:  Negative for chills and fever.  Eyes:  Negative for visual disturbance.  Respiratory:  Negative for shortness of breath and wheezing.   Cardiovascular:  Negative for chest pain and leg swelling.  Musculoskeletal:  Negative for back pain and gait problem.  Skin:  Negative for rash.  Neurological:  Negative for dizziness, weakness and light-headedness.  All other systems  reviewed and are negative.  Per HPI unless specifically indicated above   Allergies as of 10/03/2020   No Known Allergies      Medication List        Accurate as of October 03, 2020  2:30 PM. If you have any questions, ask your nurse or doctor.          aspirin EC 81 MG tablet Take 81 mg by mouth daily.   atorvastatin 80 MG tablet Commonly known as: LIPITOR TAKE 1 TABLET BY MOUTH EVERY DAY   lisinopril 5 MG tablet Commonly known as: ZESTRIL TAKE 1 TABLET BY MOUTH EVERY DAY   metFORMIN 500 MG 24 hr tablet Commonly known as: Glucophage XR Take 2 tablets (1,000 mg total) by mouth in the morning and at bedtime. What changed:  how much to take how to take this when to take this additional instructions   metoprolol succinate 50 MG 24 hr tablet Commonly known as: TOPROL-XL TAKE 1 TABLET BY MOUTH EVERY DAY WITH OR IMMEDIATELY FOLLOWING A MEAL   nitroGLYCERIN 0.4 MG SL tablet Commonly known as: NITROSTAT Place 1 tablet (0.4 mg total) under the tongue every 5 (five) minutes as needed for chest pain.   Trulicity 3 0000000 Sopn Generic drug: Dulaglutide Inject 3 mg as directed once a week.   vitamin C 1000 MG tablet Take 2,000 mg by mouth daily.         Objective:   BP 118/76   Pulse 75   Ht '5\' 11"'$  (1.803 m)   Wt 294 lb (133.4 kg)  SpO2 96%   BMI 41.00 kg/m   Wt Readings from Last 3 Encounters:  10/03/20 294 lb (133.4 kg)  07/04/20 291 lb (132 kg)  06/07/20 294 lb (133.4 kg)    Physical Exam Vitals and nursing note reviewed.  Constitutional:      General: He is not in acute distress.    Appearance: He is well-developed. He is not diaphoretic.  Eyes:     General: No scleral icterus.    Conjunctiva/sclera: Conjunctivae normal.  Neck:     Thyroid: No thyromegaly.  Cardiovascular:     Rate and Rhythm: Normal rate and regular rhythm.     Heart sounds: Normal heart sounds. No murmur heard. Pulmonary:     Effort: Pulmonary effort is normal. No  respiratory distress.     Breath sounds: Normal breath sounds. No wheezing.  Musculoskeletal:        General: Normal range of motion.     Cervical back: Neck supple.  Lymphadenopathy:     Cervical: No cervical adenopathy.  Skin:    General: Skin is warm and dry.     Findings: No rash.  Neurological:     Mental Status: He is alert and oriented to person, place, and time.     Coordination: Coordination normal.  Psychiatric:        Behavior: Behavior normal.      Assessment & Plan:   Problem List Items Addressed This Visit       Endocrine   Hyperlipidemia associated with type 2 diabetes mellitus ([place])   Relevant Medications   metFORMIN (GLUCOPHAGE XR) 500 MG 24 hr tablet   Type 2 diabetes mellitus with other specified complication (HCC) - Primary   Relevant Medications   metFORMIN (GLUCOPHAGE XR) 500 MG 24 hr tablet   Other Relevant Orders   Bayer DCA Hb A1c Waived    A1c much improved at 7.4, continue current medicine, no change, again patient does not want to discuss depression or treat for it. Follow up plan: Return in about 3 months (around 01/03/2021), or if symptoms worsen or fail to improve, for Diabetes recheck.  Counseling provided for all of the vaccine components Orders Placed This Encounter  Procedures   Bayer [place] Hb A1c [place] Dez Stauffer, MD [facility] 10/03/2020, 2:30 PM

## 2021-01-02 ENCOUNTER — Other Ambulatory Visit: Payer: Self-pay

## 2021-01-02 ENCOUNTER — Encounter: Payer: Self-pay | Admitting: Family Medicine

## 2021-01-02 ENCOUNTER — Ambulatory Visit: Payer: BC Managed Care – PPO | Admitting: Family Medicine

## 2021-01-02 VITALS — BP 127/82 | HR 88 | Ht 71.0 in | Wt 292.0 lb

## 2021-01-02 DIAGNOSIS — E1169 Type 2 diabetes mellitus with other specified complication: Secondary | ICD-10-CM

## 2021-01-02 DIAGNOSIS — E785 Hyperlipidemia, unspecified: Secondary | ICD-10-CM | POA: Diagnosis not present

## 2021-01-02 DIAGNOSIS — I152 Hypertension secondary to endocrine disorders: Secondary | ICD-10-CM

## 2021-01-02 DIAGNOSIS — E1159 Type 2 diabetes mellitus with other circulatory complications: Secondary | ICD-10-CM | POA: Diagnosis not present

## 2021-01-02 LAB — BAYER DCA HB A1C WAIVED: HB A1C (BAYER DCA - WAIVED): 7.5 % — ABNORMAL HIGH (ref 4.8–5.6)

## 2021-01-02 MED ORDER — TRULICITY 4.5 MG/0.5ML ~~LOC~~ SOAJ: 4.5000 mg | SUBCUTANEOUS | 3 refills | Status: DC

## 2021-01-02 NOTE — Progress Notes (Signed)
BP 127/82   Pulse 88   Ht '5\' 11"'  (1.803 m)   Wt 292 lb (132.5 kg)   SpO2 99%   BMI 40.73 kg/m    Subjective:   Patient ID: Carl [name], male    DOB: Jul 01, 1975, 45 y.o.   MRN: [id number]  HPI: Carl [name] is a 45 y.o. male presenting on 01/02/2021 for Medical Management of Chronic Issues, Diabetes, and Hypertension   HPI Type 2 diabetes mellitus Patient comes in today for recheck of his diabetes. Patient has been currently taking Trulicity and metformin. Patient is currently on an ACE inhibitor/ARB. Patient has seen an ophthalmologist this year. Patient denies any issues with their feet. The symptom started onset as an adult hypertension and hyperlipidemia ARE RELATED TO DM   Hypertension and CAD Patient is currently on lisinopril and metoprolol, and their blood pressure today is 127/82. Patient denies any lightheadedness or dizziness. Patient denies headaches, blurred vision, chest pains, shortness of breath, or weakness. Denies any side effects from medication and is content with current medication.   Hyperlipidemia Patient is coming in for recheck of his hyperlipidemia. The patient is currently taking atorvastatin. They deny any issues with myalgias or history of liver damage from it. They deny any focal numbness or weakness or chest pain.   Relevant past medical, surgical, family and social history reviewed and updated as indicated. Interim medical history since our last visit reviewed. Allergies and medications reviewed and updated.  Review of Systems  Constitutional:  Negative for chills and fever.  Eyes:  Negative for visual disturbance.  Respiratory:  Negative for shortness of breath and wheezing.   Cardiovascular:  Negative for chest pain and leg swelling.  Musculoskeletal:  Negative for back pain and gait problem.  Skin:  Negative for rash.  Neurological:  Negative for dizziness, weakness and light-headedness.  All other systems reviewed and are negative.  Per  HPI unless specifically indicated above   Allergies as of 01/02/2021   No Known Allergies      Medication List        Accurate as of January 02, 2021  1:50 PM. If you have any questions, ask your nurse or doctor.          STOP taking these medications    Trulicity 3 MV/6.7MC Sopn Generic drug: Dulaglutide Replaced by: Trulicity 4.5 NO/7.0JG Sopn Stopped by: Fransisca Kaufmann Symphonie Schneiderman, MD       TAKE these medications    aspirin EC 81 MG tablet Take 81 mg by mouth daily.   atorvastatin 80 MG tablet Commonly known as: LIPITOR TAKE 1 TABLET BY MOUTH EVERY DAY   lisinopril 5 MG tablet Commonly known as: ZESTRIL TAKE 1 TABLET BY MOUTH EVERY DAY   metFORMIN 500 MG 24 hr tablet Commonly known as: Glucophage XR Take 2 tablets (1,000 mg total) by mouth in the morning and at bedtime.   metoprolol succinate 50 MG 24 hr tablet Commonly known as: TOPROL-XL TAKE 1 TABLET BY MOUTH EVERY DAY WITH OR IMMEDIATELY FOLLOWING A MEAL   nitroGLYCERIN 0.4 MG SL tablet Commonly known as: NITROSTAT Place 1 tablet (0.4 mg total) under the tongue every 5 (five) minutes as needed for chest pain.   Trulicity 4.5 GE/3.6OQ Sopn Generic drug: Dulaglutide Inject 4.5 mg as directed once a week. Replaces: Trulicity 3 HU/7.6LY Sopn Started by: Fransisca Kaufmann Johsua Shevlin, MD   vitamin C 1000 MG tablet Take 2,000 mg by mouth daily.  Objective:   BP 127/82   Pulse 88   Ht '5\' 11"'  (1.803 m)   Wt 292 lb (132.5 kg)   SpO2 99%   BMI 40.73 kg/m   Wt Readings from Last 3 Encounters:  01/02/21 292 lb (132.5 kg)  10/03/20 294 lb (133.4 kg)  07/04/20 291 lb (132 kg)    Physical Exam Vitals and nursing note reviewed.  Constitutional:      General: He is not in acute distress.    Appearance: He is well-developed. He is not diaphoretic.  Eyes:     General: No scleral icterus.    Conjunctiva/sclera: Conjunctivae normal.  Neck:     Thyroid: No thyromegaly.  Cardiovascular:     Rate and  Rhythm: Normal rate and regular rhythm.     Heart sounds: Normal heart sounds. No murmur heard. Pulmonary:     Effort: Pulmonary effort is normal. No respiratory distress.     Breath sounds: Normal breath sounds. No wheezing.  Musculoskeletal:        General: No swelling. Normal range of motion.     Cervical back: Neck supple.  Lymphadenopathy:     Cervical: No cervical adenopathy.  Skin:    General: Skin is warm and dry.     Findings: No rash.  Neurological:     Mental Status: He is alert and oriented to person, place, and time.     Coordination: Coordination normal.  Psychiatric:        Behavior: Behavior normal.      Assessment & Plan:   Problem List Items Addressed This Visit       Cardiovascular and Mediastinum   Hypertension associated with diabetes ([place])   Relevant Medications   Dulaglutide (TRULICITY) 4.5 NU/2.7OZ SOPN   Other Relevant Orders   CBC with Differential/Platelet   CMP14+EGFR   Lipid panel   Bayer DCA Hb A1c Waived     Endocrine   Hyperlipidemia associated with type 2 diabetes mellitus (HCC)   Relevant Medications   Dulaglutide (TRULICITY) 4.5 DG/6.4QI SOPN   Other Relevant Orders   CBC with Differential/Platelet   CMP14+EGFR   Lipid panel   Bayer DCA Hb A1c Waived   Type 2 diabetes mellitus with other specified complication (HCC) - Primary   Relevant Medications   Dulaglutide (TRULICITY) 4.5 HK/7.4QV SOPN   Other Relevant Orders   CBC with Differential/Platelet   CMP14+EGFR   Lipid panel   Bayer DCA Hb A1c Waived    A1c 7.5 which is up slightly.  Was 7.4 last time.  Continue with Trulicity but will increase the dose from 3 to 4.5 mg. Follow up plan: Return in about 3 months (around 04/04/2021), or if symptoms worsen or fail to improve, for Type 2 diabetes.  Counseling provided for all of the vaccine components Orders Placed This Encounter  Procedures   CBC with Differential/Platelet   CMP14+EGFR   Lipid panel   Bayer DCA Hb A1c  [place], MD [facility] 01/02/2021, 1:50 PM

## 2021-01-03 LAB — LIPID PANEL
Chol/HDL Ratio: 4.6 ratio (ref 0.0–5.0)
Cholesterol, Total: 110 mg/dL (ref 100–199)
HDL: 24 mg/dL — ABNORMAL LOW (ref >39)
LDL Chol Calc (NIH): 49 mg/dL (ref 0–99)
Triglycerides: 234 mg/dL — ABNORMAL HIGH (ref 0–149)
VLDL Cholesterol Cal: 37 mg/dL (ref 5–40)

## 2021-01-03 LAB — CBC WITH DIFFERENTIAL/PLATELET
Basophils Absolute: 0 10*3/uL (ref 0.0–0.2)
Basos: 0 %
EOS (ABSOLUTE): 0.1 10*3/uL (ref 0.0–0.4)
Eos: 1 %
Hematocrit: 41.5 % (ref 37.5–51.0)
Hemoglobin: 14.3 g/dL (ref 13.0–17.7)
Immature Grans (Abs): 0.1 10*3/uL (ref 0.0–0.1)
Immature Granulocytes: 1 %
Lymphocytes Absolute: 2.7 10*3/uL (ref 0.7–3.1)
Lymphs: 27 %
MCH: 31.3 pg (ref 26.6–33.0)
MCHC: 34.5 g/dL (ref 31.5–35.7)
MCV: 91 fL (ref 79–97)
Monocytes Absolute: 0.8 10*3/uL (ref 0.1–0.9)
Monocytes: 8 %
Neutrophils Absolute: 6.3 10*3/uL (ref 1.4–7.0)
Neutrophils: 63 %
Platelets: 215 10*3/uL (ref 150–450)
RBC: 4.57 x10E6/uL (ref 4.14–5.80)
RDW: 12.7 % (ref 11.6–15.4)
WBC: 10 10*3/uL (ref 3.4–10.8)

## 2021-01-03 LAB — CMP14+EGFR
ALT: 37 IU/L (ref 0–44)
AST: 21 IU/L (ref 0–40)
Albumin/Globulin Ratio: 2.1 (ref 1.2–2.2)
Albumin: 4.4 g/dL (ref 4.0–5.0)
Alkaline Phosphatase: 75 IU/L (ref 44–121)
BUN/Creatinine Ratio: 17 (ref 9–20)
BUN: 13 mg/dL (ref 6–24)
Bilirubin Total: 0.6 mg/dL (ref 0.0–1.2)
CO2: 21 mmol/L (ref 20–29)
Calcium: 8.8 mg/dL (ref 8.7–10.2)
Chloride: 102 mmol/L (ref 96–106)
Creatinine, Ser: 0.75 mg/dL — ABNORMAL LOW (ref 0.76–1.27)
Globulin, Total: 2.1 g/dL (ref 1.5–4.5)
Glucose: 255 mg/dL — ABNORMAL HIGH (ref 70–99)
Potassium: 4.2 mmol/L (ref 3.5–5.2)
Sodium: 136 mmol/L (ref 134–144)
Total Protein: 6.5 g/dL (ref 6.0–8.5)
eGFR: 113 mL/min/{1.73_m2} (ref >59)

## 2021-04-03 ENCOUNTER — Encounter: Payer: Self-pay | Admitting: Family Medicine

## 2021-04-03 ENCOUNTER — Ambulatory Visit: Payer: BC Managed Care – PPO | Admitting: Family Medicine

## 2021-04-03 VITALS — BP 132/74 | HR 76 | Temp 97.5°F | Resp 20 | Ht 71.0 in | Wt 290.0 lb

## 2021-04-03 DIAGNOSIS — E1169 Type 2 diabetes mellitus with other specified complication: Secondary | ICD-10-CM

## 2021-04-03 DIAGNOSIS — E1159 Type 2 diabetes mellitus with other circulatory complications: Secondary | ICD-10-CM | POA: Diagnosis not present

## 2021-04-03 DIAGNOSIS — I152 Hypertension secondary to endocrine disorders: Secondary | ICD-10-CM

## 2021-04-03 DIAGNOSIS — F32A Depression, unspecified: Secondary | ICD-10-CM

## 2021-04-03 DIAGNOSIS — K219 Gastro-esophageal reflux disease without esophagitis: Secondary | ICD-10-CM | POA: Diagnosis not present

## 2021-04-03 DIAGNOSIS — F419 Anxiety disorder, unspecified: Secondary | ICD-10-CM | POA: Diagnosis not present

## 2021-04-03 DIAGNOSIS — E785 Hyperlipidemia, unspecified: Secondary | ICD-10-CM

## 2021-04-03 LAB — BAYER DCA HB A1C WAIVED: HB A1C (BAYER DCA - WAIVED): 8 % — ABNORMAL HIGH (ref 4.8–5.6)

## 2021-04-03 MED ORDER — OMEPRAZOLE 20 MG PO CPDR: 20.0000 mg | DELAYED_RELEASE_CAPSULE | Freq: Every day | ORAL | 0 refills | Status: DC

## 2021-04-03 MED ORDER — SERTRALINE HCL 50 MG PO TABS: 50.0000 mg | ORAL_TABLET | Freq: Every day | ORAL | 1 refills | Status: DC

## 2021-04-03 NOTE — Progress Notes (Signed)
BP 132/74    Pulse 76    Temp (!) 97.5 F (36.4 C) (Oral)    Resp 20    Ht 5' 11" (1.803 m)    Wt 290 lb (131.5 kg)    SpO2 97%    BMI 40.45 kg/m    Subjective:   Patient ID: Carl [name], male    DOB: [birth year], 46 y.o.   MRN: [id number]  HPI: Carl [name] is a 46 y.o. male presenting on 04/03/2021 for Medical Management of Chronic Issues   HPI GERD Patient uses the occasional but not anything consistent.  He says he is complaining today a lot of foul-smelling burps and indigestion.  He denies any abdominal pain or nausea or vomiting or blood in the stool or dark tarry stools.  He denies any lightheadedness dizziness or bleeding.  Type 2 diabetes mellitus Patient comes in today for recheck of his diabetes. Patient has been currently taking metformin and Trulicity although he does admit that he was out of Trulicity for at least 2 weeks because it was out of stock this month.  He does have it back now but it was out of stock. Patient is currently on an ACE inhibitor/ARB. Patient has not seen an ophthalmologist this year. Patient denies any issues with their feet. The symptom started onset as an adult hypertension and hyperlipidemia ARE RELATED TO DM   Hypertension Patient is currently on lisinopril and metoprolol, and their blood pressure today is 132/74. Patient denies any lightheadedness or dizziness. Patient denies headaches, blurred vision, chest pains, shortness of breath, or weakness. Denies any side effects from medication and is content with current medication.   Hyperlipidemia Patient is coming in for recheck of his hyperlipidemia. The patient is currently taking atorvastatin. They deny any issues with myalgias or history of liver damage from it. They deny any focal numbness or weakness or chest pain.   Anxiety and depression Patient is coming in today for anxiety and depression recheck.  He is continuing to decline medicine despite high scores on anxiety depression. Depression  screen [facility] 2/9 04/03/2021 01/02/2021 10/03/2020 10/03/2020 07/04/2020  Decreased Interest _0 Down, Depressed, Hopeless _1 PHQ - 2 Score _2 Altered sleeping _3 Tired, decreased energy _4 Change in appetite _5 Feeling bad or failure about yourself  _6 Trouble concentrating _7 Moving slowly or fidgety/restless _8 0 0  Suicidal thoughts _9 - 1  PHQ-9 Score _10 Difficult doing work/chores - - - - -  Some recent data might be hidden     Relevant past medical, surgical, family and social history reviewed and updated as indicated. Interim medical history since our last visit reviewed. Allergies and medications reviewed and updated.  Review of Systems  Constitutional:  Negative for chills and fever.  Eyes:  Negative for visual disturbance.  Respiratory:  Negative for shortness of breath and wheezing.   Cardiovascular:  Negative for chest pain and leg swelling.  Musculoskeletal:  Negative for back pain and gait problem.  Skin:  Negative for rash.  Neurological:  Negative for dizziness, weakness and light-headedness.  All other systems reviewed and are negative.  Per HPI unless specifically indicated above   Allergies as of 04/03/2021  No Known Allergies      Medication List        Accurate as of April 03, 2021  4:13 PM. If you have any questions, ask your nurse or doctor.          aspirin EC 81 MG tablet Take 81 mg by mouth daily.   atorvastatin 80 MG tablet Commonly known as: LIPITOR TAKE 1 TABLET BY MOUTH EVERY DAY   lisinopril 5 MG tablet Commonly known as: ZESTRIL TAKE 1 TABLET BY MOUTH EVERY DAY   metFORMIN 500 MG 24 hr tablet Commonly known as: Glucophage XR Take 2 tablets (1,000 mg total) by mouth in the morning and at bedtime.   metoprolol succinate 50 MG 24 hr tablet Commonly known as: TOPROL-XL TAKE 1 TABLET BY MOUTH EVERY DAY WITH OR IMMEDIATELY FOLLOWING A MEAL    nitroGLYCERIN 0.4 MG SL tablet Commonly known as: NITROSTAT Place 1 tablet (0.4 mg total) under the tongue every 5 (five) minutes as needed for chest pain.   omeprazole 20 MG capsule Commonly known as: PRILOSEC Take 1 capsule (20 mg total) by mouth daily. Started by: Worthy Rancher, MD   sertraline 50 MG tablet Commonly known as: Zoloft Take 1 tablet (50 mg total) by mouth daily. Started by: Worthy Rancher, MD   Trulicity 4.5 XB/9.3JQ Sopn Generic drug: Dulaglutide Inject 4.5 mg as directed once a week.   vitamin C 1000 MG tablet Take 2,000 mg by mouth daily.         Objective:   BP 132/74    Pulse 76    Temp (!) 97.5 F (36.4 C) (Oral)    Resp 20    Ht 5' 11" (1.803 m)    Wt 290 lb (131.5 kg)    SpO2 97%    BMI 40.45 kg/m   Wt Readings from Last 3 Encounters:  04/03/21 290 lb (131.5 kg)  01/02/21 292 lb (132.5 kg)  10/03/20 294 lb (133.4 kg)    Physical Exam Vitals and nursing note reviewed.  Constitutional:      General: He is not in acute distress.    Appearance: He is well-developed. He is not diaphoretic.  Eyes:     General: No scleral icterus.    Conjunctiva/sclera: Conjunctivae normal.  Neck:     Thyroid: No thyromegaly.  Cardiovascular:     Rate and Rhythm: Normal rate and regular rhythm.     Heart sounds: Normal heart sounds. No murmur heard. Pulmonary:     Effort: Pulmonary effort is normal. No respiratory distress.     Breath sounds: Normal breath sounds. No wheezing.  Musculoskeletal:        General: Normal range of motion.     Cervical back: Neck supple.  Lymphadenopathy:     Cervical: No cervical adenopathy.  Skin:    General: Skin is warm and dry.     Findings: No rash.  Neurological:     Mental Status: He is alert and oriented to person, place, and time.     Coordination: Coordination normal.  Psychiatric:        Behavior: Behavior normal.      Assessment & Plan:   Problem List Items Addressed This Visit        Cardiovascular and Mediastinum   Hypertension associated with diabetes ([place])   Relevant Orders   Microalbumin / creatinine urine ratio   Bayer DCA Hb A1c Waived   CBC with Differential/Platelet   CMP14+EGFR   Lipid panel  Digestive   GERD (gastroesophageal reflux disease)   Relevant Medications   omeprazole (PRILOSEC) 20 MG capsule     Endocrine   Hyperlipidemia associated with type 2 diabetes mellitus ([place])   Relevant Orders   Microalbumin / creatinine urine ratio   Bayer DCA Hb A1c Waived   CBC with Differential/Platelet   CMP14+EGFR   Lipid panel   Type 2 diabetes mellitus with other specified complication (HCC) - Primary   Relevant Orders   Microalbumin / creatinine urine ratio   Bayer DCA Hb A1c Waived   CBC with Differential/Platelet   CMP14+EGFR   Lipid panel     Other   Anxiety and depression   Relevant Medications   sertraline (ZOLOFT) 50 MG tablet    Acidic burps, will start on GERD medicine omeprazole. Patient's A1c is up at 8.0 but likely elevated because he was out of Trulicity for at least 2 weeks.  He does have it back now. Will start Patient on Zoloft for antidepressant. Follow up plan: Return in about 3 months (around 07/01/2021), or if symptoms worsen or fail to improve, for DM and depression and anxiety and.  Counseling provided for all of the vaccine components Orders Placed This Encounter  Procedures   Microalbumin / creatinine urine ratio   Bayer DCA Hb A1c Waived   CBC with Differential/Platelet   CMP14+EGFR   Lipid panel    Caryl Pina, MD [facility] 04/03/2021, 4:13 PM

## 2021-04-04 LAB — CBC WITH DIFFERENTIAL/PLATELET
Basophils Absolute: 0.1 10*3/uL (ref 0.0–0.2)
Basos: 0 %
EOS (ABSOLUTE): 0.2 10*3/uL (ref 0.0–0.4)
Eos: 2 %
Hematocrit: 42.3 % (ref 37.5–51.0)
Hemoglobin: 14.5 g/dL (ref 13.0–17.7)
Immature Grans (Abs): 0 10*3/uL (ref 0.0–0.1)
Immature Granulocytes: 0 %
Lymphocytes Absolute: 3.2 10*3/uL — ABNORMAL HIGH (ref 0.7–3.1)
Lymphs: 28 %
MCH: 31 pg (ref 26.6–33.0)
MCHC: 34.3 g/dL (ref 31.5–35.7)
MCV: 90 fL (ref 79–97)
Monocytes Absolute: 0.9 10*3/uL (ref 0.1–0.9)
Monocytes: 8 %
Neutrophils Absolute: 6.9 10*3/uL (ref 1.4–7.0)
Neutrophils: 62 %
Platelets: 223 10*3/uL (ref 150–450)
RBC: 4.68 x10E6/uL (ref 4.14–5.80)
RDW: 12.5 % (ref 11.6–15.4)
WBC: 11.2 10*3/uL — ABNORMAL HIGH (ref 3.4–10.8)

## 2021-04-04 LAB — CMP14+EGFR
ALT: 39 IU/L (ref 0–44)
AST: 23 IU/L (ref 0–40)
Albumin/Globulin Ratio: 1.6 (ref 1.2–2.2)
Albumin: 4.3 g/dL (ref 4.0–5.0)
Alkaline Phosphatase: 81 IU/L (ref 44–121)
BUN/Creatinine Ratio: 17 (ref 9–20)
BUN: 15 mg/dL (ref 6–24)
Bilirubin Total: 0.5 mg/dL (ref 0.0–1.2)
CO2: 20 mmol/L (ref 20–29)
Calcium: 9.4 mg/dL (ref 8.7–10.2)
Chloride: 104 mmol/L (ref 96–106)
Creatinine, Ser: 0.9 mg/dL (ref 0.76–1.27)
Globulin, Total: 2.7 g/dL (ref 1.5–4.5)
Glucose: 115 mg/dL — ABNORMAL HIGH (ref 70–99)
Potassium: 4.4 mmol/L (ref 3.5–5.2)
Sodium: 139 mmol/L (ref 134–144)
Total Protein: 7 g/dL (ref 6.0–8.5)
eGFR: 106 mL/min/{1.73_m2} (ref >59)

## 2021-04-04 LAB — MICROALBUMIN / CREATININE URINE RATIO
Creatinine, Urine: 96.3 mg/dL
Microalb/Creat Ratio: 34 mg/g creat — ABNORMAL HIGH (ref 0–29)
Microalbumin, Urine: 33.2 ug/mL

## 2021-04-04 LAB — LIPID PANEL
Chol/HDL Ratio: 3.7 ratio (ref 0.0–5.0)
Cholesterol, Total: 99 mg/dL — ABNORMAL LOW (ref 100–199)
HDL: 27 mg/dL — ABNORMAL LOW (ref >39)
LDL Chol Calc (NIH): 47 mg/dL (ref 0–99)
Triglycerides: 141 mg/dL (ref 0–149)
VLDL Cholesterol Cal: 25 mg/dL (ref 5–40)

## 2021-06-09 ENCOUNTER — Telehealth: Payer: Self-pay | Admitting: Family Medicine

## 2021-06-09 NOTE — Telephone Encounter (Signed)
Key: [facility] [place] Case ID: [id number] - Rx #: [id number] Need help? Call us at [phone] ?Outcome ?Additional Information Required ?This request has been approved using information available on the patient's profile. CaseId:[id number];Status:Approved;Review Type:Prior Auth;Coverage Start Date:05/10/2021;Coverage End Date:06/09/2022; ?Drug Trulicity 4.'5MG'$ /0.5ML pen-injectors ?

## 2021-06-17 ENCOUNTER — Other Ambulatory Visit: Payer: Self-pay | Admitting: *Deleted

## 2021-06-17 ENCOUNTER — Telehealth: Payer: Self-pay | Admitting: Family Medicine

## 2021-06-17 DIAGNOSIS — E1169 Type 2 diabetes mellitus with other specified complication: Secondary | ICD-10-CM

## 2021-06-17 MED ORDER — TRULICITY 4.5 MG/0.5ML ~~LOC~~ SOAJ: 4.5000 mg | SUBCUTANEOUS | 0 refills | Status: DC

## 2021-06-17 NOTE — Telephone Encounter (Signed)
?  Prescription Request ? ?06/17/2021 ? ?Is this a "Controlled Substance" medicine? no ? ?Have you seen your PCP in the last 2 weeks? No pt has appt with PCP on 06/26/2021 ? ?If YES, route message to pool  -  If NO, patient needs to be scheduled for appointment. ? ?What is the name of the medication or equipment? Dulaglutide (TRULICITY) 4.5 OF/7.5ZW SOPN   ?Pt needs 90 day supply ? ?Have you contacted your pharmacy to request a refill? yes  ? ?Which pharmacy would you like this sent to? [facility]  ? ? ?Patient notified that their request is being sent to the clinical staff for review and that they should receive a response within 2 business days.  ? ? ?

## 2021-06-17 NOTE — Telephone Encounter (Signed)
REFILLED AS REQUESTED ?

## 2021-06-26 ENCOUNTER — Encounter: Payer: Self-pay | Admitting: Family Medicine

## 2021-06-26 ENCOUNTER — Ambulatory Visit: Payer: BC Managed Care – PPO | Admitting: Family Medicine

## 2021-06-26 VITALS — BP 126/80 | HR 89 | Ht 71.0 in | Wt 293.0 lb

## 2021-06-26 DIAGNOSIS — E1159 Type 2 diabetes mellitus with other circulatory complications: Secondary | ICD-10-CM

## 2021-06-26 DIAGNOSIS — E1169 Type 2 diabetes mellitus with other specified complication: Secondary | ICD-10-CM

## 2021-06-26 DIAGNOSIS — E118 Type 2 diabetes mellitus with unspecified complications: Secondary | ICD-10-CM

## 2021-06-26 DIAGNOSIS — E785 Hyperlipidemia, unspecified: Secondary | ICD-10-CM

## 2021-06-26 DIAGNOSIS — I152 Hypertension secondary to endocrine disorders: Secondary | ICD-10-CM

## 2021-06-26 LAB — BAYER DCA HB A1C WAIVED: HB A1C (BAYER DCA - WAIVED): 7.5 % — ABNORMAL HIGH (ref 4.8–5.6)

## 2021-06-26 MED ORDER — METFORMIN HCL ER 500 MG PO TB24: 1000.0000 mg | ORAL_TABLET | Freq: Two times a day (BID) | ORAL | 3 refills | Status: DC

## 2021-06-26 MED ORDER — TRULICITY 4.5 MG/0.5ML ~~LOC~~ SOAJ: 4.5000 mg | SUBCUTANEOUS | 3 refills | Status: DC

## 2021-06-26 NOTE — Progress Notes (Signed)
? ?BP 126/80   Pulse 89   Ht '5\' 11"'$  (1.803 m)   Wt 293 lb (132.9 kg)   SpO2 97%   BMI 40.87 kg/m?   ? ?Subjective:  ? ?Patient ID: Carl [name], male    DOB: 1975/08/02, 46 y.o.   MRN: [id number] ? ?HPI: ?Carl [name] is a 46 y.o. male presenting on 06/26/2021 for Medical Management of Chronic Issues and Diabetes ? ? ?HPI ?Type 2 diabetes mellitus ?Patient comes in today for recheck of his diabetes. Patient has been currently taking Trulicity and metformin, A1c today is 7.5 and that is with him having been off the Trulicity for 3 weeks, he needs it sent to [facility]. Patient is currently on an ACE inhibitor/ARB. Patient has not seen an ophthalmologist this year. Patient denies any issues with their feet. The symptom started onset as an adult hypertension and hyperlipidemia and CAD ARE RELATED TO DM  ? ?Hypertension ?Patient is currently on lisinopril and metoprolol, and their blood pressure today is 126/80. Patient denies any lightheadedness or dizziness. Patient denies headaches, blurred vision, chest pains, shortness of breath, or weakness. Denies any side effects from medication and is content with current medication.  ? ?Hyperlipidemia ?Patient is coming in for recheck of his hyperlipidemia. The patient is currently taking atorvastatin. They deny any issues with myalgias or history of liver damage from it. They deny any focal numbness or weakness or chest pain.  ? ?Relevant past medical, surgical, family and social history reviewed and updated as indicated. Interim medical history since our last visit reviewed. ?Allergies and medications reviewed and updated. ? ?Review of Systems  ?Constitutional:  Negative for chills and fever.  ?Eyes:  Negative for visual disturbance.  ?Respiratory:  Negative for shortness of breath and wheezing.   ?Cardiovascular:  Negative for chest pain and leg swelling.  ?Musculoskeletal:  Negative for back pain and gait problem.  ?Skin:  Negative for rash.  ?Neurological:   Negative for dizziness, weakness and light-headedness.  ?Psychiatric/Behavioral:  Positive for dysphoric mood. Negative for decreased concentration, self-injury, sleep disturbance and suicidal ideas. The patient is nervous/anxious.   ?All other systems reviewed and are negative. ? ?Per HPI unless specifically indicated above ? ? ?Allergies as of 06/26/2021   ?No Known Allergies ?  ? ?  ?Medication List  ?  ? ?  ? Accurate as of Jun 26, 2021  3:40 PM. If you have any questions, ask your nurse or doctor.  ?  ?  ? ?  ? ?STOP taking these medications   ? ?sertraline 50 MG tablet ?Commonly known as: Zoloft ?Stopped by: Worthy Rancher, MD ?  ? ?  ? ?TAKE these medications   ? ?aspirin EC 81 MG tablet ?Take 81 mg by mouth daily. ?  ?atorvastatin 80 MG tablet ?Commonly known as: LIPITOR ?TAKE 1 TABLET BY MOUTH EVERY DAY ?  ?lisinopril 5 MG tablet ?Commonly known as: ZESTRIL ?TAKE 1 TABLET BY MOUTH EVERY DAY ?  ?metFORMIN 500 MG 24 hr tablet ?Commonly known as: Glucophage XR ?Take 2 tablets (1,000 mg total) by mouth in the morning and at bedtime. ?  ?metoprolol succinate 50 MG 24 hr tablet ?Commonly known as: TOPROL-XL ?TAKE 1 TABLET BY MOUTH EVERY DAY WITH OR IMMEDIATELY FOLLOWING A MEAL ?  ?nitroGLYCERIN 0.4 MG SL tablet ?Commonly known as: NITROSTAT ?Place 1 tablet (0.4 mg total) under the tongue every 5 (five) minutes as needed for chest pain. ?  ?omeprazole 20 MG capsule ?Commonly  known as: PRILOSEC ?Take 1 capsule (20 mg total) by mouth daily. ?  ?Trulicity 4.5 XL/2.4MW Sopn ?Generic drug: Dulaglutide ?Inject 4.5 mg as directed once a week. ?  ?vitamin C 1000 MG tablet ?Take 2,000 mg by mouth daily. ?  ? ?  ? ? ? ?Objective:  ? ?BP 126/80   Pulse 89   Ht '5\' 11"'$  (1.803 m)   Wt 293 lb (132.9 kg)   SpO2 97%   BMI 40.87 kg/m?   ?Wt Readings from Last 3 Encounters:  ?06/26/21 293 lb (132.9 kg)  ?04/03/21 290 lb (131.5 kg)  ?01/02/21 292 lb (132.5 kg)  ?  ?Physical Exam ?Vitals and nursing note reviewed.   ?Constitutional:   ?   General: He is not in acute distress. ?   Appearance: He is well-developed. He is not diaphoretic.  ?Eyes:  ?   General: No scleral icterus. ?   Conjunctiva/sclera: Conjunctivae normal.  ?Neck:  ?   Thyroid: No thyromegaly.  ?Cardiovascular:  ?   Rate and Rhythm: Normal rate and regular rhythm.  ?   Heart sounds: Normal heart sounds. No murmur heard. ?Pulmonary:  ?   Effort: Pulmonary effort is normal. No respiratory distress.  ?   Breath sounds: Normal breath sounds. No wheezing.  ?Musculoskeletal:     ?   General: No swelling. Normal range of motion.  ?   Cervical back: Neck supple.  ?Lymphadenopathy:  ?   Cervical: No cervical adenopathy.  ?Skin: ?   General: Skin is warm and dry.  ?   Findings: No rash.  ?Neurological:  ?   Mental Status: He is alert and oriented to person, place, and time.  ?   Coordination: Coordination normal.  ?Psychiatric:     ?   Behavior: Behavior normal.  ? ? ? ? ?Assessment & Plan:  ? ?Problem List Items Addressed This Visit   ? ?  ? Cardiovascular and Mediastinum  ? Hypertension associated with diabetes ([place])  ? Relevant Medications  ? metFORMIN (GLUCOPHAGE XR) 500 MG 24 hr tablet  ? Dulaglutide (TRULICITY) 4.5 NU/2.7OZ SOPN  ?  ? Endocrine  ? Hyperlipidemia associated with type 2 diabetes mellitus ([place])  ? Relevant Medications  ? metFORMIN (GLUCOPHAGE XR) 500 MG 24 hr tablet  ? Dulaglutide (TRULICITY) 4.5 DG/6.4QI SOPN  ? Type 2 diabetes mellitus with other specified complication ([place]) - Primary  ? Relevant Medications  ? metFORMIN (GLUCOPHAGE XR) 500 MG 24 hr tablet  ? Dulaglutide (TRULICITY) 4.5 HK/7.4QV SOPN  ? Other Relevant Orders  ? Bayer DCA Hb A1c Waived  ? ?Other Visit Diagnoses   ? ? Controlled type 2 diabetes mellitus with complication, without long-term current use of insulin ([place])      ? Relevant Medications  ? metFORMIN (GLUCOPHAGE XR) 500 MG 24 hr tablet  ? Dulaglutide (TRULICITY) 4.5 ZD/6.3OV SOPN  ? ?  ?  ?Continue current medicine, restart the  Trulicity once he gets it.  A1c looks decent at 7.5, he could look better once he gets back on the Trulicity. ? ?Patient declines treatment for depression at this point. ?Follow up plan: ?Return in about 3 months (around 09/26/2021), or if symptoms worsen or fail to improve, for Diabetes and hypertension and cholesterol recheck. ? ?Counseling provided for all of the vaccine components ?Orders Placed This Encounter  ?Procedures  ? Bayer DCA Hb A1c Waived  ? ? ?Caryl Pina, MD ?[place] ?06/26/2021, 3:40 PM ? ? ?  ?

## 2021-06-29 ENCOUNTER — Other Ambulatory Visit: Payer: Self-pay | Admitting: Family Medicine

## 2021-06-29 DIAGNOSIS — K219 Gastro-esophageal reflux disease without esophagitis: Secondary | ICD-10-CM

## 2021-07-16 ENCOUNTER — Other Ambulatory Visit: Payer: Self-pay | Admitting: Cardiology

## 2021-07-28 ENCOUNTER — Encounter: Payer: Self-pay | Admitting: *Deleted

## 2021-07-29 ENCOUNTER — Encounter: Payer: Self-pay | Admitting: Cardiology

## 2021-07-29 ENCOUNTER — Ambulatory Visit (INDEPENDENT_AMBULATORY_CARE_PROVIDER_SITE_OTHER): Payer: BC Managed Care – PPO | Admitting: Cardiology

## 2021-07-29 ENCOUNTER — Telehealth: Payer: Self-pay | Admitting: Cardiology

## 2021-07-29 VITALS — BP 124/80 | HR 94 | Ht 71.0 in | Wt 293.2 lb

## 2021-07-29 DIAGNOSIS — I2510 Atherosclerotic heart disease of native coronary artery without angina pectoris: Secondary | ICD-10-CM | POA: Diagnosis not present

## 2021-07-29 DIAGNOSIS — E782 Mixed hyperlipidemia: Secondary | ICD-10-CM

## 2021-07-29 DIAGNOSIS — R079 Chest pain, unspecified: Secondary | ICD-10-CM

## 2021-07-29 DIAGNOSIS — I10 Essential (primary) hypertension: Secondary | ICD-10-CM | POA: Diagnosis not present

## 2021-07-29 MED ORDER — ATORVASTATIN CALCIUM 80 MG PO TABS: 80.0000 mg | ORAL_TABLET | Freq: Every day | ORAL | 3 refills | Status: DC

## 2021-07-29 MED ORDER — LISINOPRIL 5 MG PO TABS: 5.0000 mg | ORAL_TABLET | Freq: Every day | ORAL | 3 refills | Status: DC

## 2021-07-29 MED ORDER — METOPROLOL SUCCINATE ER 50 MG PO TB24: ORAL_TABLET | ORAL | 3 refills | Status: DC

## 2021-07-29 MED ORDER — NITROGLYCERIN 0.4 MG SL SUBL: 0.4000 mg | SUBLINGUAL_TABLET | SUBLINGUAL | 3 refills | Status: AC | PRN

## 2021-07-29 NOTE — Patient Instructions (Addendum)
Medication Instructions:  Your physician recommends that you continue on your current medications as directed. Please refer to the Current Medication list given to you today.   Labwork: none  Testing/Procedures: Your physician has requested that you have an exercise tolerance test. For further information please visit [email or web address]. Please also follow instruction sheet, as given.   Follow-Up: Your physician recommends that you schedule a follow-up appointment in: 6 months  Any Other Special Instructions Will Be Listed Below (If Applicable).  If you need a refill on your cardiac medications before your next appointment, please call your pharmacy.

## 2021-07-29 NOTE — Telephone Encounter (Signed)
Checking percert on the following patient for testing scheduled at [facility].    exercise Stress Test 2 day protocol dx: chest pain    DAY 1    08/07/2021

## 2021-07-29 NOTE — Progress Notes (Signed)
Clinical Summary Carl [name] is a 46 y.o.male seen today for follow up of the following medical problem.s    1. CAD - abnormal exericse stress in 2015. - cath 2015 at [facility] with occluded proximal LAD, LAD filled with left-left and right-left collaterals. Medically managed  - echo Jan 2018 LVEF 55-60%     - nuclear stress Jan 2018 showed apical and distal anterolateral infarct with ischemia. Overall intermediate to high risk.      06/2017 cath with prox LAD 100%, RCA prox 50%, RPDA 40%, norma LCX - he was referred for evaluation for CTO intervention on his LAD - 08/2017 succesful CTO PCI of LAD with DES x 2.  Imdur stopped that admission due to headaches.       -some recent chest pains - mild episodes over the last 6 months. Mid to left chest, 2/10 in severity. Can occur at rest or with activity. No other associated symptoms. No association with meals. Not positional. Lasts about few seconds. Sporadic in frequency.  -        2. Hyperlipidemia -05/2019 TC 116 TG 208 HDL 27 LDL 55 - compliant with statin 03/2021 TC 99 TG 141 HDL 27 LDL 47   3. HTN - compliant with meds - 5/11 visit at [facility] bp 107/68        Past Medical History:  Diagnosis Date   Anxiety    CAD in native artery    a. cath in 2015 [facility] showing occluded proximal LAD, LAD filled with left-left and right-left collaterals, otherwise 20-30% prox-mid RCA, LVEF 55%. b. Abnormal nuc 02/2016 - [facility] medically. c. redo cath in 06/2017 showing 100% Prox LAD stenosis, 50% RCA, and 40% RPDA and underwent CTO PCI of the LAD with DESx2 to in 08/2017   Daily headache    "since I started Plavix" (08/25/2017)   Depression    Diabetes mellitus type 2 in obese (HCC)    Hyperlipidemia    Hypertension    OSA on CPAP      No Known Allergies   Current Outpatient Medications  Medication Sig Dispense Refill   Ascorbic Acid (VITAMIN C) 1000 MG tablet Take 2,000 mg by mouth daily.     aspirin EC 81 MG tablet Take 81 mg by  mouth daily.      atorvastatin (LIPITOR) 80 MG tablet TAKE 1 TABLET BY MOUTH EVERY DAY 90 tablet 3   Dulaglutide (TRULICITY) 4.5 TK/1.6WF SOPN Inject 4.5 mg as directed once a week. 6 mL 3   lisinopril (ZESTRIL) 5 MG tablet TAKE 1 TABLET BY MOUTH EVERY DAY 90 tablet 3   metFORMIN (GLUCOPHAGE XR) 500 MG 24 hr tablet Take 2 tablets (1,000 mg total) by mouth in the morning and at bedtime. 360 tablet 3   metoprolol succinate (TOPROL-XL) 50 MG 24 hr tablet TAKE 1 TABLET BY MOUTH EVERY DAY WITH OR IMMEDIATELY FOLLOWING A MEAL 30 tablet 0   nitroGLYCERIN (NITROSTAT) 0.4 MG SL tablet Place 1 tablet (0.4 mg total) under the tongue every 5 (five) minutes as needed for chest pain. 25 tablet 3   omeprazole (PRILOSEC) 20 MG capsule TAKE ONE CAPSULE BY MOUTH EVERY DAY 90 capsule 0   No current facility-administered medications for this visit.     Past Surgical History:  Procedure Laterality Date   CORONARY CTO INTERVENTION N/A 08/25/2017   Procedure: CORONARY CTO INTERVENTION;  Surgeon: Martinique, Peter M, MD;  Location: [place] [facility];  Service: Cardiovascular;  Laterality: N/A;  LEFT HEART CATH AND CORONARY ANGIOGRAPHY N/A 07/21/2017   Procedure: LEFT HEART CATH AND CORONARY ANGIOGRAPHY;  Surgeon: Belva Crome, MD;  Location: [place] [facility];  Service: Cardiovascular;  Laterality: N/A;   MOLE REMOVAL Right    "chin"   TUMOR EXCISION Left 1991   knee area     No Known Allergies    Family History  Problem Relation Age of Onset   Diabetes Mother    Cancer Mother    Diabetes Father      Social History Carl [name] reports that he has been smoking cigarettes. He started smoking about 28 years ago. He has a 48.00 pack-year smoking history. He has never used smokeless tobacco. Carl [name] reports current alcohol use.   Review of Systems CONSTITUTIONAL: No weight loss, fever, chills, weakness or fatigue.  HEENT: Eyes: No visual loss, blurred vision, double vision or yellow sclerae.No hearing  loss, sneezing, congestion, runny nose or sore throat.  SKIN: No rash or itching.  CARDIOVASCULAR: per hpi RESPIRATORY: No shortness of breath, cough or sputum.  GASTROINTESTINAL: No anorexia, nausea, vomiting or diarrhea. No abdominal pain or blood.  GENITOURINARY: No burning on urination, no polyuria NEUROLOGICAL: No headache, dizziness, syncope, paralysis, ataxia, numbness or tingling in the extremities. No change in bowel or bladder control.  MUSCULOSKELETAL: No muscle, back pain, joint pain or stiffness.  LYMPHATICS: No enlarged nodes. No history of splenectomy.  PSYCHIATRIC: No history of depression or anxiety.  ENDOCRINOLOGIC: No reports of sweating, cold or heat intolerance. No polyuria or polydipsia.  Marland Kitchen   Physical Examination Today's Vitals   07/29/21 1349  BP: 124/80  Pulse: 94  SpO2: 97%  Weight: 293 lb 3.2 oz (133 kg)  Height: '5\' 11"'$  (1.803 m)   Body mass index is 40.89 kg/m.  Gen: resting comfortably, no acute distress HEENT: no scleral icterus, pupils equal round and reactive, no palptable cervical adenopathy,  CV: RRR, no m/r/g no jvd Resp: Clear to auscultation bilaterally GI: abdomen is soft, non-tender, non-distended, normal bowel sounds, no hepatosplenomegaly MSK: extremities are warm, no edema.  Skin: warm, no rash Neuro:  no focal deficits Psych: appropriate affect   Diagnostic Studies  05/2020 GXT Patient achieved a maximum workload of 7 METS, MPHR 86%. There were equivocal ST segment changes, 0.5 mm ST segment depression in the inferior leads. Nonlimiting, vague chest discomfort reported. Hypertensive response noted to exercise. There were no arrhythmias. Intermediate Duke treadmill score of -1. Blood pressure demonstrated a hypertensive response to exercise.   Assessment and Plan  1. CAD - recent chest pain symptmos - GXT last year with sublte changes - plan for exercise nuclear stress 2 day protocol, hold toprol day before.    2.  Hyperlipidema - LDL remains at goal, continue curren tmeds   3. HTN - he is at goal, continue current meds      Arnoldo Lenis, M.D.

## 2021-08-07 ENCOUNTER — Encounter (HOSPITAL_COMMUNITY): Payer: BC Managed Care – PPO

## 2021-08-07 ENCOUNTER — Encounter (HOSPITAL_COMMUNITY)
Admission: RE | Admit: 2021-08-07 | Discharge: 2021-08-07 | Disposition: A | Discharge status: Home / Self Care | Payer: BC Managed Care – PPO | Source: Ambulatory Visit | Attending: Cardiology | Admitting: Cardiology

## 2021-08-07 ENCOUNTER — Ambulatory Visit (HOSPITAL_COMMUNITY)
Admission: RE | Admit: 2021-08-07 | Discharge: 2021-08-07 | Disposition: A | Discharge status: Home / Self Care | Payer: BC Managed Care – PPO | Source: Ambulatory Visit | Attending: Cardiology | Admitting: Cardiology

## 2021-08-07 DIAGNOSIS — R079 Chest pain, unspecified: Secondary | ICD-10-CM | POA: Insufficient documentation

## 2021-08-07 MED ORDER — TECHNETIUM TC 99M TETROFOSMIN IV KIT
30.0000 | PACK | Freq: Once | INTRAVENOUS | Status: AC | PRN
  Administered 2021-08-07: 33 via INTRAVENOUS

## 2021-08-07 MED ORDER — SODIUM CHLORIDE FLUSH 0.9 % IV SOLN
INTRAVENOUS | Status: AC
  Filled 2021-08-07: qty 10

## 2021-08-07 MED ORDER — REGADENOSON 0.4 MG/5ML IV SOLN
INTRAVENOUS | Status: AC
  Filled 2021-08-07: qty 5

## 2021-08-08 ENCOUNTER — Encounter (HOSPITAL_COMMUNITY)
Admission: RE | Admit: 2021-08-08 | Discharge: 2021-08-08 | Disposition: A | Discharge status: Home / Self Care | Payer: BC Managed Care – PPO | Source: Ambulatory Visit | Attending: Cardiology | Admitting: Cardiology

## 2021-08-08 ENCOUNTER — Encounter (HOSPITAL_COMMUNITY): Payer: Self-pay

## 2021-08-08 LAB — NM MYOCAR MULTI W/SPECT W/WALL MOTION / EF
Angina Index: 0
Estimated workload: 7.2
Exercise duration (min): 5 min
Exercise duration (sec): 28 s
LV dias vol: 116 mL (ref 62–150)
LV sys vol: 56 mL
MPHR: 178 {beats}/min
Nuc Stress EF: 51 %
Peak HR: 153 {beats}/min
Percent HR: 85 %
RATE: 0.4
RPE: 14
Rest HR: 94 {beats}/min
Rest Nuclear Isotope Dose: 30 mCi
SDS: 4
SRS: 8
SSS: 12
Stress Nuclear Isotope Dose: 33 mCi
TID: 1.02

## 2021-08-08 MED ORDER — TECHNETIUM TC 99M TETROFOSMIN IV KIT
30.0000 | PACK | Freq: Once | INTRAVENOUS | Status: AC | PRN
  Administered 2021-08-08: 30 via INTRAVENOUS

## 2021-08-12 ENCOUNTER — Telehealth: Payer: Self-pay | Admitting: *Deleted

## 2021-08-12 MED ORDER — AMLODIPINE BESYLATE 2.5 MG PO TABS: 2.5000 mg | ORAL_TABLET | Freq: Every day | ORAL | 0 refills | Status: DC

## 2021-08-12 NOTE — Telephone Encounter (Signed)
Laurine Blazer, LPN  1/62/4469 50:72 AM EDT Back to Top    Notified, copy to pcp.  New medication sent to [facility] today.  He will be able to pick up in the next 2-3 days.  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

## 2021-08-12 NOTE — Telephone Encounter (Signed)
-----   Message from Arnoldo Lenis, MD sent at 08/11/2021  4:59 PM EDT ----- Small abnormal area on stress suggesting small blockage. If ongoing symptoms would try to manage with medications initially, if refractory may consider repeat cath. Can he start norvasc 2.'5mg'$  daily and update [place] on symptoms in 1 week  Zandra Abts MD

## 2021-10-02 ENCOUNTER — Encounter: Payer: Self-pay | Admitting: Family Medicine

## 2021-10-02 ENCOUNTER — Ambulatory Visit: Payer: BC Managed Care – PPO | Admitting: Family Medicine

## 2021-10-02 VITALS — BP 119/77 | HR 105 | Temp 98.0°F | Ht 71.0 in | Wt 292.0 lb

## 2021-10-02 DIAGNOSIS — E1159 Type 2 diabetes mellitus with other circulatory complications: Secondary | ICD-10-CM

## 2021-10-02 DIAGNOSIS — E1169 Type 2 diabetes mellitus with other specified complication: Secondary | ICD-10-CM

## 2021-10-02 DIAGNOSIS — K219 Gastro-esophageal reflux disease without esophagitis: Secondary | ICD-10-CM

## 2021-10-02 DIAGNOSIS — Z125 Encounter for screening for malignant neoplasm of prostate: Secondary | ICD-10-CM

## 2021-10-02 DIAGNOSIS — I152 Hypertension secondary to endocrine disorders: Secondary | ICD-10-CM

## 2021-10-02 DIAGNOSIS — E785 Hyperlipidemia, unspecified: Secondary | ICD-10-CM

## 2021-10-02 LAB — BAYER DCA HB A1C WAIVED: HB A1C (BAYER DCA - WAIVED): 8.2 % — ABNORMAL HIGH (ref 4.8–5.6)

## 2021-10-02 MED ORDER — OMEPRAZOLE 40 MG PO CPDR: 40.0000 mg | DELAYED_RELEASE_CAPSULE | Freq: Every day | ORAL | 1 refills | Status: DC

## 2021-10-02 NOTE — Progress Notes (Signed)
BP 119/77   Pulse (!) 105   Temp 98 F (36.7 C)   Ht 5' 11" (1.803 m)   Wt 292 lb (132.5 kg)   SpO2 96%   BMI 40.73 kg/m    Subjective:   Patient ID: Carl [name], male    DOB: [birth year], 46 y.o.   MRN: [id number]  HPI: Carl [name] is a 46 y.o. male presenting on 10/02/2021 for Medical Management of Chronic Issues and Diabetes   HPI Type 2 diabetes mellitus Patient comes in today for recheck of his diabetes. Patient has been currently taking Trulicity and metformin. Patient is currently on an ACE inhibitor/ARB. Patient has seen an ophthalmologist this year. Patient denies any issues with their feet. The symptom started onset as an adult hypertension hyperlipidemia sleep apnea ARE RELATED TO DM   Hypertension Patient is currently on metoprolol and lisinopril and amlodipine, and their blood pressure today is 119/77. Patient denies any lightheadedness or dizziness. Patient denies headaches, blurred vision, chest pains, shortness of breath, or weakness. Denies any side effects from medication and is content with current medication.   Hyperlipidemia Patient is coming in for recheck of his hyperlipidemia. The patient is currently taking Lipitor. They deny any issues with myalgias or history of liver damage from it. They deny any focal numbness or weakness or chest pain.   GERD Patient is currently on omeprazole 20.  He is having a lot of belching and feeling gas and has not improved since he started the omeprazole 20  Relevant past medical, surgical, family and social history reviewed and updated as indicated. Interim medical history since our last visit reviewed. Allergies and medications reviewed and updated.  Review of Systems  Constitutional:  Negative for chills and fever.  Eyes:  Negative for visual disturbance.  Respiratory:  Negative for shortness of breath and wheezing.   Cardiovascular:  Negative for chest pain and leg swelling.  Musculoskeletal:  Negative for back  pain and gait problem.  Skin:  Negative for rash.  Neurological:  Negative for dizziness, weakness and light-headedness.  All other systems reviewed and are negative.   Per HPI unless specifically indicated above   Allergies as of 10/02/2021   No Known Allergies      Medication List        Accurate as of October 02, 2021  1:54 PM. If you have any questions, ask your nurse or doctor.          amLODipine 2.5 MG tablet Commonly known as: NORVASC Take 1 tablet (2.5 mg total) by mouth daily.   aspirin EC 81 MG tablet Take 81 mg by mouth daily.   atorvastatin 80 MG tablet Commonly known as: LIPITOR Take 1 tablet (80 mg total) by mouth daily.   lisinopril 5 MG tablet Commonly known as: ZESTRIL Take 1 tablet (5 mg total) by mouth daily.   metFORMIN 500 MG 24 hr tablet Commonly known as: Glucophage XR Take 2 tablets (1,000 mg total) by mouth in the morning and at bedtime.   metoprolol succinate 50 MG 24 hr tablet Commonly known as: TOPROL-XL TAKE 1 TABLET BY MOUTH EVERY DAY WITH OR IMMEDIATELY FOLLOWING A MEAL   nitroGLYCERIN 0.4 MG SL tablet Commonly known as: NITROSTAT Place 1 tablet (0.4 mg total) under the tongue every 5 (five) minutes x 3 doses as needed for chest pain (if no relief after 2nd dose, proceed to ED or call 911).   omeprazole 40 MG capsule Commonly known as: PRILOSEC  Take 1 capsule (40 mg total) by mouth daily. What changed:  medication strength how much to take Changed by: Fransisca Kaufmann Dettinger, MD   Trulicity 4.5 DD/2.2GU Sopn Generic drug: Dulaglutide Inject 4.5 mg as directed once a week.   vitamin C 1000 MG tablet Take 2,000 mg by mouth daily.         Objective:   BP 119/77   Pulse (!) 105   Temp 98 F (36.7 C)   Ht 5' 11" (1.803 m)   Wt 292 lb (132.5 kg)   SpO2 96%   BMI 40.73 kg/m   Wt Readings from Last 3 Encounters:  10/02/21 292 lb (132.5 kg)  07/29/21 293 lb 3.2 oz (133 kg)  06/26/21 293 lb (132.9 kg)    Physical  Exam Vitals and nursing note reviewed.  Constitutional:      General: He is not in acute distress.    Appearance: He is well-developed. He is not diaphoretic.  Eyes:     General: No scleral icterus.    Conjunctiva/sclera: Conjunctivae normal.  Neck:     Thyroid: No thyromegaly.  Cardiovascular:     Rate and Rhythm: Normal rate and regular rhythm.     Heart sounds: Normal heart sounds. No murmur heard. Pulmonary:     Effort: Pulmonary effort is normal. No respiratory distress.     Breath sounds: Normal breath sounds. No wheezing.  Abdominal:     General: Abdomen is flat. Bowel sounds are normal. There is no distension.     Palpations: Abdomen is soft.     Tenderness: There is no abdominal tenderness. There is no guarding or rebound.  Musculoskeletal:        General: Normal range of motion.     Cervical back: Neck supple.  Lymphadenopathy:     Cervical: No cervical adenopathy.  Skin:    General: Skin is warm and dry.     Findings: No rash.  Neurological:     Mental Status: He is alert and oriented to person, place, and time.     Coordination: Coordination normal.  Psychiatric:        Behavior: Behavior normal.       Assessment & Plan:   Problem List Items Addressed This Visit       Cardiovascular and Mediastinum   Hypertension associated with diabetes ([place])   Relevant Orders   CBC with Differential/Platelet   CMP14+EGFR   Lipid panel   Bayer DCA Hb A1c Waived     Digestive   GERD (gastroesophageal reflux disease)   Relevant Medications   omeprazole (PRILOSEC) 40 MG capsule     Endocrine   Hyperlipidemia associated with type 2 diabetes mellitus ([place])   Relevant Orders   CBC with Differential/Platelet   CMP14+EGFR   Lipid panel   Bayer DCA Hb A1c Waived   Type 2 diabetes mellitus with other specified complication (HCC) - Primary   Relevant Orders   CBC with Differential/Platelet   CMP14+EGFR   Lipid panel   Bayer DCA Hb A1c Waived   Other Visit  Diagnoses     Prostate cancer screening       Relevant Orders   PSA, total and free       Patient admits to having a lot worse diet recently, A1c is up to 8.2.  He says he will refocus on diet.  No change in medication right now but may consider addition to in the future.  Patient is a trucker so resistant to  start insulin  Acid reflux and belching and burping still going on, increase omeprazole but if not improved may consider EGD.[email or web address]  Follow up plan: Return in about 3 months (around 01/02/2022), or if symptoms worsen or fail to improve, for Diabetes hypertension and cholesterol.  Counseling provided for all of the vaccine components Orders Placed This Encounter  Procedures   CBC with Differential/Platelet   CMP14+EGFR   Lipid panel   Bayer DCA Hb A1c Waived   PSA, total and free    Caryl Pina, MD [facility] 10/02/2021, 1:54 PM

## 2021-10-03 LAB — CMP14+EGFR
ALT: 37 IU/L (ref 0–44)
AST: 20 IU/L (ref 0–40)
Albumin/Globulin Ratio: 1.6 (ref 1.2–2.2)
Albumin: 4.5 g/dL (ref 4.1–5.1)
Alkaline Phosphatase: 88 IU/L (ref 44–121)
BUN/Creatinine Ratio: 19 (ref 9–20)
BUN: 18 mg/dL (ref 6–24)
Bilirubin Total: 0.5 mg/dL (ref 0.0–1.2)
CO2: 21 mmol/L (ref 20–29)
Calcium: 9.6 mg/dL (ref 8.7–10.2)
Chloride: 98 mmol/L (ref 96–106)
Creatinine, Ser: 0.95 mg/dL (ref 0.76–1.27)
Globulin, Total: 2.8 g/dL (ref 1.5–4.5)
Glucose: 275 mg/dL — ABNORMAL HIGH (ref 70–99)
Potassium: 4.5 mmol/L (ref 3.5–5.2)
Sodium: 136 mmol/L (ref 134–144)
Total Protein: 7.3 g/dL (ref 6.0–8.5)
eGFR: 99 mL/min/{1.73_m2} (ref >59)

## 2021-10-03 LAB — CBC WITH DIFFERENTIAL/PLATELET
Basophils Absolute: 0 10*3/uL (ref 0.0–0.2)
Basos: 0 %
EOS (ABSOLUTE): 0.1 10*3/uL (ref 0.0–0.4)
Eos: 1 %
Hematocrit: 44.6 % (ref 37.5–51.0)
Hemoglobin: 15.3 g/dL (ref 13.0–17.7)
Immature Grans (Abs): 0.1 10*3/uL (ref 0.0–0.1)
Immature Granulocytes: 1 %
Lymphocytes Absolute: 3 10*3/uL (ref 0.7–3.1)
Lymphs: 30 %
MCH: 31.4 pg (ref 26.6–33.0)
MCHC: 34.3 g/dL (ref 31.5–35.7)
MCV: 91 fL (ref 79–97)
Monocytes Absolute: 0.9 10*3/uL (ref 0.1–0.9)
Monocytes: 9 %
Neutrophils Absolute: 5.9 10*3/uL (ref 1.4–7.0)
Neutrophils: 59 %
Platelets: 230 10*3/uL (ref 150–450)
RBC: 4.88 x10E6/uL (ref 4.14–5.80)
RDW: 12.4 % (ref 11.6–15.4)
WBC: 10 10*3/uL (ref 3.4–10.8)

## 2021-10-03 LAB — LIPID PANEL
Chol/HDL Ratio: 4.9 ratio (ref 0.0–5.0)
Cholesterol, Total: 123 mg/dL (ref 100–199)
HDL: 25 mg/dL — ABNORMAL LOW (ref >39)
LDL Chol Calc (NIH): 48 mg/dL (ref 0–99)
Triglycerides: 330 mg/dL — ABNORMAL HIGH (ref 0–149)
VLDL Cholesterol Cal: 50 mg/dL — ABNORMAL HIGH (ref 5–40)

## 2021-10-03 LAB — PSA, TOTAL AND FREE
PSA, Free Pct: 21.1 %
PSA, Free: 0.19 ng/mL
Prostate Specific Ag, Serum: 0.9 ng/mL (ref 0.0–4.0)

## 2021-10-13 LAB — HM DIABETES EYE EXAM

## 2022-01-01 ENCOUNTER — Ambulatory Visit: Payer: BC Managed Care – PPO | Admitting: Family Medicine

## 2022-01-01 ENCOUNTER — Encounter: Payer: Self-pay | Admitting: Family Medicine

## 2022-01-01 VITALS — BP 109/66 | HR 107 | Temp 98.7°F | Ht 71.0 in | Wt 288.0 lb

## 2022-01-01 DIAGNOSIS — K219 Gastro-esophageal reflux disease without esophagitis: Secondary | ICD-10-CM

## 2022-01-01 DIAGNOSIS — I152 Hypertension secondary to endocrine disorders: Secondary | ICD-10-CM

## 2022-01-01 DIAGNOSIS — E1169 Type 2 diabetes mellitus with other specified complication: Secondary | ICD-10-CM | POA: Diagnosis not present

## 2022-01-01 DIAGNOSIS — E1159 Type 2 diabetes mellitus with other circulatory complications: Secondary | ICD-10-CM | POA: Diagnosis not present

## 2022-01-01 DIAGNOSIS — E785 Hyperlipidemia, unspecified: Secondary | ICD-10-CM

## 2022-01-01 LAB — BAYER DCA HB A1C WAIVED: HB A1C (BAYER DCA - WAIVED): 8.1 % — ABNORMAL HIGH (ref 4.8–5.6)

## 2022-01-01 MED ORDER — OMEPRAZOLE 40 MG PO CPDR: 40.0000 mg | DELAYED_RELEASE_CAPSULE | Freq: Every day | ORAL | 3 refills | Status: DC

## 2022-01-01 MED ORDER — AMLODIPINE BESYLATE 2.5 MG PO TABS: 2.5000 mg | ORAL_TABLET | Freq: Every day | ORAL | 0 refills | Status: DC

## 2022-01-01 MED ORDER — LISINOPRIL 5 MG PO TABS: 5.0000 mg | ORAL_TABLET | Freq: Every day | ORAL | 3 refills | Status: DC

## 2022-01-01 MED ORDER — ATORVASTATIN CALCIUM 80 MG PO TABS: 80.0000 mg | ORAL_TABLET | Freq: Every day | ORAL | 3 refills | Status: DC

## 2022-01-01 MED ORDER — EMPAGLIFLOZIN 10 MG PO TABS: 10.0000 mg | ORAL_TABLET | Freq: Every day | ORAL | 3 refills | Status: DC

## 2022-01-01 NOTE — Progress Notes (Signed)
BP 109/66   Pulse (!) 107   Temp 98.7 F (37.1 C)   Ht _0  (1.803 m)   Wt 288 lb (130.6 kg)   SpO2 96%   BMI 40.17 kg/m    Subjective:   Patient ID: Carl [name], male    DOB: 1976/02/10, 46 y.o.   MRN: [id number]  HPI: Carl [name] is a 46 y.o. male presenting on 01/01/2022 for Medical Management of Chronic Issues (3 month ), Foot Pain (Pt states this has not gotten any better - right now pain is a 6 ), and Hand Pain (Left hand locking up - 9 months )   HPI Type 2 diabetes mellitus Patient comes in today for recheck of his diabetes. Patient has been currently taking metformin and trulicity. Patient is currently on an ACE inhibitor/ARB. Patient has not seen an ophthalmologist this year. Patient denies any issues with their feet. The symptom started onset as an adult hypertension and hyperlipidemia and sleep apnea ARE RELATED TO DM   Hypertension Patient is currently on metoprolol and amlodipine and lisinopril, and their blood pressure today is 109/67. Patient denies any lightheadedness or dizziness. Patient denies headaches, blurred vision, chest pains, shortness of breath, or weakness. Denies any side effects from medication and is content with current medication.   Hyperlipidemia Patient is coming in for recheck of his hyperlipidemia. The patient is currently taking atorvastatin. They deny any issues with myalgias or history of liver damage from it. They deny any focal numbness or weakness or chest pain.   GERD Patient is currently on omeprazole.  She denies any major symptoms or abdominal pain or belching or burping. She denies any blood in her stool or lightheadedness or dizziness.   Plantar fasciitis right foot Patient is on plantar fascia his right foot, has taking some anti-inflammatories but they have not seem to resolve it.  He has not tried icing.  We discussed possible injection.  He is wearing his shoes more around the house to try and prevent it.  This has been  going on for quite many months.  Left hand catch Patient comes in complaining about 1 month of some of the fingers on his left hand that they get caught in more of a closed position and he has to work it to get them to fully open.  He feels the pulling all the way up into the muscles of his forearm.  He has not had this before this is new.  Relevant past medical, surgical, family and social history reviewed and updated as indicated. Interim medical history since our last visit reviewed. Allergies and medications reviewed and updated.  Review of Systems  Constitutional:  Negative for chills and fever.  Eyes:  Negative for visual disturbance.  Respiratory:  Negative for shortness of breath and wheezing.   Cardiovascular:  Negative for chest pain and leg swelling.  Musculoskeletal:  Positive for arthralgias. Negative for back pain and gait problem.  Skin:  Negative for rash.  Neurological:  Negative for dizziness, weakness and light-headedness.  All other systems reviewed and are negative.   Per HPI unless specifically indicated above   Allergies as of 01/01/2022   No Known Allergies      Medication List        Accurate as of January 01, 2022  1:58 PM. If you have any questions, ask your nurse or doctor.          amLODipine 2.5 MG tablet Commonly known as: NORVASC  Take 1 tablet (2.5 mg total) by mouth daily.   aspirin EC 81 MG tablet Take 81 mg by mouth daily.   atorvastatin 80 MG tablet Commonly known as: LIPITOR Take 1 tablet (80 mg total) by mouth daily.   empagliflozin 10 MG Tabs tablet Commonly known as: Jardiance Take 1 tablet (10 mg total) by mouth daily before breakfast. Started by: Fransisca Kaufmann Crescencio Jozwiak, MD   lisinopril 5 MG tablet Commonly known as: ZESTRIL Take 1 tablet (5 mg total) by mouth daily.   metFORMIN 500 MG 24 hr tablet Commonly known as: Glucophage XR Take 2 tablets (1,000 mg total) by mouth in the morning and at bedtime.   metoprolol  succinate 50 MG 24 hr tablet Commonly known as: TOPROL-XL TAKE 1 TABLET BY MOUTH EVERY DAY WITH OR IMMEDIATELY FOLLOWING A MEAL   nitroGLYCERIN 0.4 MG SL tablet Commonly known as: NITROSTAT Place 1 tablet (0.4 mg total) under the tongue every 5 (five) minutes x 3 doses as needed for chest pain (if no relief after 2nd dose, proceed to ED or call 911).   omeprazole 40 MG capsule Commonly known as: PRILOSEC Take 1 capsule (40 mg total) by mouth daily.   Trulicity 4.5 ZO/1.0RU Sopn Generic drug: Dulaglutide Inject 4.5 mg as directed once a week.   vitamin C 1000 MG tablet Take 2,000 mg by mouth daily.         Objective:   BP 109/66   Pulse (!) 107   Temp 98.7 F (37.1 C)   Ht _0  (1.803 m)   Wt 288 lb (130.6 kg)   SpO2 96%   BMI 40.17 kg/m   Wt Readings from Last 3 Encounters:  01/01/22 288 lb (130.6 kg)  10/02/21 292 lb (132.5 kg)  07/29/21 293 lb 3.2 oz (133 kg)    Physical Exam Vitals and nursing note reviewed.  Constitutional:      General: He is not in acute distress.    Appearance: He is well-developed. He is not diaphoretic.  Eyes:     General: No scleral icterus.    Conjunctiva/sclera: Conjunctivae normal.  Neck:     Thyroid: No thyromegaly.  Cardiovascular:     Rate and Rhythm: Normal rate and regular rhythm.     Heart sounds: Normal heart sounds. No murmur heard. Pulmonary:     Effort: Pulmonary effort is normal. No respiratory distress.     Breath sounds: Normal breath sounds. No wheezing.  Musculoskeletal:        General: No swelling. Normal range of motion.     Cervical back: Neck supple.  Lymphadenopathy:     Cervical: No cervical adenopathy.  Skin:    General: Skin is warm and dry.     Findings: No rash.  Neurological:     Mental Status: He is alert and oriented to person, place, and time.     Coordination: Coordination normal.  Psychiatric:        Behavior: Behavior normal.     Results for orders placed or performed in visit on  10/13/21  HM DIABETES EYE EXAM  Result Value Ref Range   HM Diabetic Eye Exam No Retinopathy No Retinopathy    Assessment & Plan:   Problem List Items Addressed This Visit       Cardiovascular and Mediastinum   Hypertension associated with diabetes ([place])   Relevant Medications   amLODipine (NORVASC) 2.5 MG tablet   atorvastatin (LIPITOR) 80 MG tablet   lisinopril (ZESTRIL) 5 MG tablet  empagliflozin (JARDIANCE) 10 MG TABS tablet     Digestive   GERD (gastroesophageal reflux disease)   Relevant Medications   omeprazole (PRILOSEC) 40 MG capsule     Endocrine   Hyperlipidemia associated with type 2 diabetes mellitus (HCC)   Relevant Medications   amLODipine (NORVASC) 2.5 MG tablet   atorvastatin (LIPITOR) 80 MG tablet   lisinopril (ZESTRIL) 5 MG tablet   empagliflozin (JARDIANCE) 10 MG TABS tablet   Type 2 diabetes mellitus with other specified complication (HCC) - Primary   Relevant Medications   amLODipine (NORVASC) 2.5 MG tablet   atorvastatin (LIPITOR) 80 MG tablet   lisinopril (ZESTRIL) 5 MG tablet   empagliflozin (JARDIANCE) 10 MG TABS tablet   Other Relevant Orders   Bayer DCA Hb A1c Waived   CBC with Differential/Platelet   CMP14+EGFR   Vitamin B12   Lipid panel   Thyroid Panel With TSH    A1c is up at 8.1, will give Jardiance and see how that does, will start low-dose to see if he tolerates because 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  Counseling provided for all of the vaccine components Orders Placed This Encounter  Procedures   Bayer DCA Hb A1c Waived   CBC with Differential/Platelet   CMP14+EGFR   Vitamin B12   Lipid panel   Thyroid Panel With TSH    Caryl Pina, MD [facility] 01/01/2022, 1:58 PM

## 2022-01-02 LAB — LIPID PANEL
Chol/HDL Ratio: 4.5 ratio (ref 0.0–5.0)
Cholesterol, Total: 103 mg/dL (ref 100–199)
HDL: 23 mg/dL — ABNORMAL LOW (ref >39)
LDL Chol Calc (NIH): 44 mg/dL (ref 0–99)
Triglycerides: 222 mg/dL — ABNORMAL HIGH (ref 0–149)
VLDL Cholesterol Cal: 36 mg/dL (ref 5–40)

## 2022-01-02 LAB — CMP14+EGFR
ALT: 49 IU/L — ABNORMAL HIGH (ref 0–44)
AST: 38 IU/L (ref 0–40)
Albumin/Globulin Ratio: 1.6 (ref 1.2–2.2)
Albumin: 4.2 g/dL (ref 4.1–5.1)
Alkaline Phosphatase: 82 IU/L (ref 44–121)
BUN/Creatinine Ratio: 11 (ref 9–20)
BUN: 11 mg/dL (ref 6–24)
Bilirubin Total: 0.8 mg/dL (ref 0.0–1.2)
CO2: 21 mmol/L (ref 20–29)
Calcium: 9.4 mg/dL (ref 8.7–10.2)
Chloride: 101 mmol/L (ref 96–106)
Creatinine, Ser: 0.99 mg/dL (ref 0.76–1.27)
Globulin, Total: 2.7 g/dL (ref 1.5–4.5)
Glucose: 246 mg/dL — ABNORMAL HIGH (ref 70–99)
Potassium: 4.3 mmol/L (ref 3.5–5.2)
Sodium: 136 mmol/L (ref 134–144)
Total Protein: 6.9 g/dL (ref 6.0–8.5)
eGFR: 95 mL/min/{1.73_m2} (ref >59)

## 2022-01-02 LAB — VITAMIN B12: Vitamin B-12: 607 pg/mL (ref 232–1245)

## 2022-01-02 LAB — CBC WITH DIFFERENTIAL/PLATELET
Basophils Absolute: 0 10*3/uL (ref 0.0–0.2)
Basos: 0 %
EOS (ABSOLUTE): 0.1 10*3/uL (ref 0.0–0.4)
Eos: 2 %
Hematocrit: 42.5 % (ref 37.5–51.0)
Hemoglobin: 14.6 g/dL (ref 13.0–17.7)
Immature Grans (Abs): 0 10*3/uL (ref 0.0–0.1)
Immature Granulocytes: 1 %
Lymphocytes Absolute: 2.5 10*3/uL (ref 0.7–3.1)
Lymphs: 30 %
MCH: 31.4 pg (ref 26.6–33.0)
MCHC: 34.4 g/dL (ref 31.5–35.7)
MCV: 91 fL (ref 79–97)
Monocytes Absolute: 0.7 10*3/uL (ref 0.1–0.9)
Monocytes: 9 %
Neutrophils Absolute: 5 10*3/uL (ref 1.4–7.0)
Neutrophils: 58 %
Platelets: 236 10*3/uL (ref 150–450)
RBC: 4.65 x10E6/uL (ref 4.14–5.80)
RDW: 12.5 % (ref 11.6–15.4)
WBC: 8.5 10*3/uL (ref 3.4–10.8)

## 2022-01-02 LAB — THYROID PANEL WITH TSH
Free Thyroxine Index: 1.9 (ref 1.2–4.9)
T3 Uptake Ratio: 27 % (ref 24–39)
T4, Total: 7.2 ug/dL (ref 4.5–12.0)
TSH: 1.03 u[IU]/mL (ref 0.450–4.500)

## 2022-02-12 ENCOUNTER — Encounter: Payer: Self-pay | Admitting: Cardiology

## 2022-02-12 ENCOUNTER — Ambulatory Visit: Payer: BC Managed Care – PPO | Attending: Cardiology | Admitting: Cardiology

## 2022-02-12 VITALS — BP 126/80 | HR 89 | Ht 71.0 in | Wt 279.2 lb

## 2022-02-12 DIAGNOSIS — I25118 Atherosclerotic heart disease of native coronary artery with other forms of angina pectoris: Secondary | ICD-10-CM

## 2022-02-12 DIAGNOSIS — I152 Hypertension secondary to endocrine disorders: Secondary | ICD-10-CM | POA: Diagnosis not present

## 2022-02-12 DIAGNOSIS — E1159 Type 2 diabetes mellitus with other circulatory complications: Secondary | ICD-10-CM

## 2022-02-12 DIAGNOSIS — E782 Mixed hyperlipidemia: Secondary | ICD-10-CM | POA: Diagnosis not present

## 2022-02-12 MED ORDER — AMLODIPINE BESYLATE 2.5 MG PO TABS: 2.5000 mg | ORAL_TABLET | Freq: Every day | ORAL | 3 refills | Status: DC

## 2022-02-12 NOTE — Progress Notes (Signed)
Clinical Summary Carl [name] is a 46 y.o.maleseen today for follow up of the following medical problem.s    1. CAD - abnormal exericse stress in 2015. - cath 2015 at [facility] with occluded proximal LAD, LAD filled with left-left and right-left collaterals. Medically managed  - echo Jan 2018 LVEF 55-60%     - nuclear stress Jan 2018 showed apical and distal anterolateral infarct with ischemia. Overall intermediate to high risk.      06/2017 cath with prox LAD 100%, RCA prox 50%, RPDA 40%, norma LCX - he was referred for evaluation for CTO intervention on his LAD - 08/2017 succesful CTO PCI of LAD with DES x 2.   Imdur stopped that admission due to headaches.         - prior visit reported chest pain, mild episodes over the last 6 months. Mid to left chest, 2/10 in severity. Can occur at rest or with activity. No other associated symptoms. No association with meals. Not positional. Lasts about few seconds. Sporadic in frequency.  07/2021 nuclear stress: inferior st depressions, ischemia distal anterior and apical walls. Rated as low to intermediate risk. SDS 4.  - started norvasc 2.'5mg'$  daily.    - still some chest pains, mild and infrequent. Started norvasc but ran out     2. Hyperlipidemia -05/2019 TC 116 TG 208 HDL 27 LDL 55 - compliant with statin 03/2021 TC 99 TG 141 HDL 27 LDL 47  12/2021 TC 103 TG 222 HDL 23 LDL 44   3. HTN - he is compliant with meds   SH: works at [employer]  just bought a house   Past Medical History:  Diagnosis Date   Anxiety    CAD in native artery    a. cath in 2015 [facility] showing occluded proximal LAD, LAD filled with left-left and right-left collaterals, otherwise 20-30% prox-mid RCA, LVEF 55%. b. Abnormal nuc 02/2016 - [facility] medically. c. redo cath in 06/2017 showing 100% Prox LAD stenosis, 50% RCA, and 40% RPDA and underwent CTO PCI of the LAD with DESx2 to in 08/2017   Daily headache    "since I started Plavix" (08/25/2017)   Depression     Diabetes mellitus type 2 in obese (HCC)    Hyperlipidemia    Hypertension    OSA on CPAP      No Known Allergies   Current Outpatient Medications  Medication Sig Dispense Refill   amLODipine (NORVASC) 2.5 MG tablet Take 1 tablet (2.5 mg total) by mouth daily. 90 tablet 0   Ascorbic Acid (VITAMIN C) 1000 MG tablet Take 2,000 mg by mouth daily.     aspirin EC 81 MG tablet Take 81 mg by mouth daily.      atorvastatin (LIPITOR) 80 MG tablet Take 1 tablet (80 mg total) by mouth daily. 90 tablet 3   Dulaglutide (TRULICITY) 4.5 WI/2.0BT SOPN Inject 4.5 mg as directed once a week. 6 mL 3   empagliflozin (JARDIANCE) 10 MG TABS tablet Take 1 tablet (10 mg total) by mouth daily before breakfast. 90 tablet 3   lisinopril (ZESTRIL) 5 MG tablet Take 1 tablet (5 mg total) by mouth daily. 90 tablet 3   metFORMIN (GLUCOPHAGE XR) 500 MG 24 hr tablet Take 2 tablets (1,000 mg total) by mouth in the morning and at bedtime. 360 tablet 3   metoprolol succinate (TOPROL-XL) 50 MG 24 hr tablet TAKE 1 TABLET BY MOUTH EVERY DAY WITH OR IMMEDIATELY FOLLOWING A MEAL 90  tablet 3   nitroGLYCERIN (NITROSTAT) 0.4 MG SL tablet Place 1 tablet (0.4 mg total) under the tongue every 5 (five) minutes x 3 doses as needed for chest pain (if no relief after 2nd dose, proceed to ED or call 911). 25 tablet 3   omeprazole (PRILOSEC) 40 MG capsule Take 1 capsule (40 mg total) by mouth daily. 90 capsule 3   No current facility-administered medications for this visit.     Past Surgical History:  Procedure Laterality Date   CORONARY CTO INTERVENTION N/A 08/25/2017   Procedure: CORONARY CTO INTERVENTION;  Surgeon: Martinique, Peter M, MD;  Location: [facility];  Service: Cardiovascular;  Laterality: N/A;   LEFT HEART CATH AND CORONARY ANGIOGRAPHY N/A 07/21/2017   Procedure: LEFT HEART CATH AND CORONARY ANGIOGRAPHY;  Surgeon: Belva Crome, MD;  Location: [facility];  Service: Cardiovascular;  Laterality: N/A;   MOLE  REMOVAL Right    "chin"   TUMOR EXCISION Left 1991   knee area     No Known Allergies    Family History  Problem Relation Age of Onset   Diabetes Mother    Cancer Mother    Diabetes Father      Social History Carl [name] reports that he has been smoking cigarettes. He started smoking about 29 years ago. He has a 48.00 pack-year smoking history. He has never used smokeless tobacco. Carl [name] reports current alcohol use.   Review of Systems CONSTITUTIONAL: No weight loss, fever, chills, weakness or fatigue.  HEENT: Eyes: No visual loss, blurred vision, double vision or yellow sclerae.No hearing loss, sneezing, congestion, runny nose or sore throat.  SKIN: No rash or itching.  CARDIOVASCULAR: per hpi RESPIRATORY: No shortness of breath, cough or sputum.  GASTROINTESTINAL: No anorexia, nausea, vomiting or diarrhea. No abdominal pain or blood.  GENITOURINARY: No burning on urination, no polyuria NEUROLOGICAL: No headache, dizziness, syncope, paralysis, ataxia, numbness or tingling in the extremities. No change in bowel or bladder control.  MUSCULOSKELETAL: No muscle, back pain, joint pain or stiffness.  LYMPHATICS: No enlarged nodes. No history of splenectomy.  PSYCHIATRIC: No history of depression or anxiety.  ENDOCRINOLOGIC: No reports of sweating, cold or heat intolerance. No polyuria or polydipsia.  Marland Kitchen   Physical Examination Today's Vitals   02/12/22 0823  BP: 126/80  Pulse: 89  SpO2: 98%  Weight: 279 lb 3.2 oz (126.6 kg)  Height: '5\' 11"'$  (1.803 m)   Body mass index is 38.94 kg/m.  Gen: resting comfortably, no acute distress HEENT: no scleral icterus, pupils equal round and reactive, no palptable cervical adenopathy,  CV: RRR, no m/rg, no jvd Resp: Clear to auscultation bilaterally GI: abdomen is soft, non-tender, non-distended, normal bowel sounds, no hepatosplenomegaly MSK: extremities are warm, no edema.  Skin: warm, no rash Neuro:  no focal deficits Psych:  appropriate affect   Diagnostic Studies  05/2020 GXT Patient achieved a maximum workload of 7 METS, MPHR 86%. There were equivocal ST segment changes, 0.5 mm ST segment depression in the inferior leads. Nonlimiting, vague chest discomfort reported. Hypertensive response noted to exercise. There were no arrhythmias. Intermediate Duke treadmill score of -1. Blood pressure demonstrated a hypertensive response to exercise.   07/2021 nuclear stress Exercise sterss test:  Clinically negative, electrically positive with ST depression in inferior leads that improved in the recovery period   Myoview scan with ischemia in the distal anterior and apical walls   LVEF on gating calculated at 51% wiht mild hypokinesis of the  distal anterior wall   Low to intermediate risk scan.  Compared to myoview scan done in 2018, defect in similar distribution but less severe in current study.    Assessment and Plan   1. CAD with chronic stable angina - infrequent mild chest pain at times, recent stress test mild ischemia. Treat medically at this time. Restart norvasc 2.'5mg'$  daily, titrate as needed. Prior headaches on imdur.     2. Hyperlipidema -LDL is at goal, we discussed dietary and lifestyle changes to improve TGs and HDL   3. HTN - bp at goal, continue current meds  F/u 3 months     Arnoldo Lenis, M.D.

## 2022-02-12 NOTE — Patient Instructions (Addendum)
Medication Instructions:  Continue all current medications.  Labwork: none  Testing/Procedures: none  Follow-Up: 3 months   Any Other Special Instructions Will Be Listed Below (If Applicable).  If you need a refill on your cardiac medications before your next appointment, please call your pharmacy.  

## 2022-03-26 ENCOUNTER — Ambulatory Visit: Payer: BC Managed Care – PPO | Admitting: Family Medicine

## 2022-03-27 ENCOUNTER — Encounter: Payer: Self-pay | Admitting: Family Medicine

## 2022-04-03 ENCOUNTER — Encounter: Payer: Self-pay | Admitting: Family Medicine

## 2022-04-03 ENCOUNTER — Ambulatory Visit: Payer: BC Managed Care – PPO | Admitting: Family Medicine

## 2022-04-03 VITALS — BP 130/67 | HR 92 | Ht 71.0 in | Wt 269.0 lb

## 2022-04-03 DIAGNOSIS — E1159 Type 2 diabetes mellitus with other circulatory complications: Secondary | ICD-10-CM | POA: Diagnosis not present

## 2022-04-03 DIAGNOSIS — I152 Hypertension secondary to endocrine disorders: Secondary | ICD-10-CM | POA: Diagnosis not present

## 2022-04-03 DIAGNOSIS — E785 Hyperlipidemia, unspecified: Secondary | ICD-10-CM | POA: Diagnosis not present

## 2022-04-03 DIAGNOSIS — E1169 Type 2 diabetes mellitus with other specified complication: Secondary | ICD-10-CM

## 2022-04-03 LAB — BAYER DCA HB A1C WAIVED: HB A1C (BAYER DCA - WAIVED): 7 % — ABNORMAL HIGH (ref 4.8–5.6)

## 2022-04-03 NOTE — Progress Notes (Signed)
BP 130/67   Pulse 92   Ht 5' 11"$  (1.803 m)   Wt 269 lb (122 kg)   SpO2 97%   BMI 37.52 kg/m    Subjective:   Patient ID: Carl [name], male    DOB: 12/14/1975, 47 y.o.   MRN: [id number]  HPI: Carl [name] is a 47 y.o. male presenting on 04/03/2022 for Medical Management of Chronic Issues and Diabetes   HPI Type 2 diabetes mellitus Patient comes in today for recheck of his diabetes. Patient has been currently taking metformin and Jardiance and Trulicity. Patient is currently on an ACE inhibitor/ARB. Patient has not seen an ophthalmologist this year. Patient denies any issues with their feet. The symptom started onset as an adult hypertension and hyperlipidemia and CAD ARE RELATED TO DM   Hypertension Patient is currently on amlodipine lisinopril and metoprolol, and their blood pressure today is 130/67. Patient denies any lightheadedness or dizziness. Patient denies headaches, blurred vision, chest pains, shortness of breath, or weakness. Denies any side effects from medication and is content with current medication.   Hyperlipidemia Patient is coming in for recheck of his hyperlipidemia. The patient is currently taking atorvastatin. They deny any issues with myalgias or history of liver damage from it. They deny any focal numbness or weakness or chest pain.   Relevant past medical, surgical, family and social history reviewed and updated as indicated. Interim medical history since our last visit reviewed. Allergies and medications reviewed and updated.  Review of Systems  Constitutional:  Negative for chills and fever.  Eyes:  Negative for visual disturbance.  Respiratory:  Negative for shortness of breath and wheezing.   Cardiovascular:  Negative for chest pain and leg swelling.  Musculoskeletal:  Negative for back pain and gait problem.  Skin:  Negative for rash.  Neurological:  Negative for dizziness, weakness and light-headedness.  All other systems reviewed and are  negative.   Per HPI unless specifically indicated above   Allergies as of 04/03/2022   No Known Allergies      Medication List        Accurate as of April 03, 2022  1:53 PM. If you have any questions, ask your nurse or doctor.          amLODipine 2.5 MG tablet Commonly known as: NORVASC Take 1 tablet (2.5 mg total) by mouth daily.   aspirin EC 81 MG tablet Take 81 mg by mouth daily.   atorvastatin 80 MG tablet Commonly known as: LIPITOR Take 1 tablet (80 mg total) by mouth daily.   Jardiance 10 MG Tabs tablet Generic drug: empagliflozin Take 10 mg by mouth daily.   lisinopril 5 MG tablet Commonly known as: ZESTRIL Take 1 tablet (5 mg total) by mouth daily.   metFORMIN 500 MG 24 hr tablet Commonly known as: Glucophage XR Take 2 tablets (1,000 mg total) by mouth in the morning and at bedtime.   metoprolol succinate 50 MG 24 hr tablet Commonly known as: TOPROL-XL TAKE 1 TABLET BY MOUTH EVERY DAY WITH OR IMMEDIATELY FOLLOWING A MEAL   nitroGLYCERIN 0.4 MG SL tablet Commonly known as: NITROSTAT Place 1 tablet (0.4 mg total) under the tongue every 5 (five) minutes x 3 doses as needed for chest pain (if no relief after 2nd dose, proceed to ED or call 911).   omeprazole 40 MG capsule Commonly known as: PRILOSEC Take 1 capsule (40 mg total) by mouth daily.   Trulicity 4.5 0000000 Sopn Generic drug: Dulaglutide  Inject 4.5 mg as directed once a week.   vitamin C 1000 MG tablet Take 2,000 mg by mouth daily.         Objective:   BP 130/67   Pulse 92   Ht 5' 11"$  (1.803 m)   Wt 269 lb (122 kg)   SpO2 97%   BMI 37.52 kg/m   Wt Readings from Last 3 Encounters:  04/03/22 269 lb (122 kg)  02/12/22 279 lb 3.2 oz (126.6 kg)  01/01/22 288 lb (130.6 kg)    Physical Exam Vitals and nursing note reviewed.  Constitutional:      General: He is not in acute distress.    Appearance: He is well-developed. He is not diaphoretic.  Eyes:     General: No scleral  icterus.    Conjunctiva/sclera: Conjunctivae normal.  Neck:     Thyroid: No thyromegaly.  Cardiovascular:     Rate and Rhythm: Normal rate and regular rhythm.     Heart sounds: Normal heart sounds. No murmur heard. Pulmonary:     Effort: Pulmonary effort is normal. No respiratory distress.     Breath sounds: Normal breath sounds. No wheezing.  Musculoskeletal:        General: No swelling. Normal range of motion.     Cervical back: Neck supple.  Lymphadenopathy:     Cervical: No cervical adenopathy.  Skin:    General: Skin is warm and dry.     Findings: No rash.  Neurological:     Mental Status: He is alert and oriented to person, place, and time.     Coordination: Coordination normal.  Psychiatric:        Behavior: Behavior normal.       Assessment & Plan:   Problem List Items Addressed This Visit       Cardiovascular and Mediastinum   Hypertension associated with diabetes ([place])   Relevant Medications   JARDIANCE 10 MG TABS tablet     Endocrine   Hyperlipidemia associated with type 2 diabetes mellitus ([place])   Relevant Medications   JARDIANCE 10 MG TABS tablet   Type 2 diabetes mellitus with other specified complication (HCC) - Primary   Relevant Medications   JARDIANCE 10 MG TABS tablet   Other Relevant Orders   Bayer DCA Hb A1c Waived   Microalbumin / creatinine urine ratio    A1c 7.0.  Much improved from last time.  Continue current medicine, no changes   Follow up plan: Return in about 3 months (around 07/02/2022), or if symptoms worsen or fail to improve, for Diabetes and hypertension and cholesterol.  Counseling provided for all of the vaccine components Orders Placed This Encounter  Procedures   Bayer [place] Hb A1c Waived   Microalbumin / creatinine urine ratio    Caryl Pina, MD [facility] 04/03/2022, 1:53 PM

## 2022-05-07 ENCOUNTER — Other Ambulatory Visit: Payer: Self-pay | Admitting: Family Medicine

## 2022-05-07 DIAGNOSIS — E1169 Type 2 diabetes mellitus with other specified complication: Secondary | ICD-10-CM

## 2022-05-28 ENCOUNTER — Encounter: Payer: Self-pay | Admitting: Cardiology

## 2022-05-28 ENCOUNTER — Ambulatory Visit: Payer: BC Managed Care – PPO | Attending: Cardiology | Admitting: Cardiology

## 2022-05-28 VITALS — BP 122/80 | HR 88 | Ht 71.0 in | Wt 264.4 lb

## 2022-05-28 DIAGNOSIS — I25118 Atherosclerotic heart disease of native coronary artery with other forms of angina pectoris: Secondary | ICD-10-CM

## 2022-05-28 DIAGNOSIS — I10 Essential (primary) hypertension: Secondary | ICD-10-CM

## 2022-05-28 DIAGNOSIS — E782 Mixed hyperlipidemia: Secondary | ICD-10-CM | POA: Diagnosis not present

## 2022-05-28 NOTE — Progress Notes (Signed)
Clinical Summary Carl [name] is a 47 y.o.male seen today for follow up of the following medical problem.s    1. CAD - abnormal exericse stress in 2015. - cath 2015 at [facility] with occluded proximal LAD, LAD filled with left-left and right-left collaterals. Medically managed  - echo Jan 2018 LVEF 55-60%     - nuclear stress Jan 2018 showed apical and distal anterolateral infarct with ischemia. Overall intermediate to high risk.      06/2017 cath with prox LAD 100%, RCA prox 50%, RPDA 40%, norma LCX - he was referred for evaluation for CTO intervention on his LAD - 08/2017 succesful CTO PCI of LAD with DES x 2.    Imdur stopped that admission due to headaches.    07/2021 nuclear stress: inferior st depressions, ischemia distal anterior and apical walls. Rated as low to intermediate risk. SDS 4.    - last visit we started norvasc 2.5mg  daily as additional antianginal  - chronic stable chest pains. Overall infrequent   2. Hyperlipidemia -05/2019 TC 116 TG 208 HDL 27 LDL 55 - compliant with statin 03/2021 TC 99 TG 141 HDL 27 LDL 47  12/2021 TC 103 TG 222 HDL 23 LDL 44   3. HTN - he is compliant with meds  4. DM2 - 03/2022 HgbA1c 7     SH: works at [employer]  just bought a house Past Medical History:  Diagnosis Date   Anxiety    CAD in native artery    a. cath in 2015 [facility] showing occluded proximal LAD, LAD filled with left-left and right-left collaterals, otherwise 20-30% prox-mid RCA, LVEF 55%. b. Abnormal nuc 02/2016 - [facility] medically. c. redo cath in 06/2017 showing 100% Prox LAD stenosis, 50% RCA, and 40% RPDA and underwent CTO PCI of the LAD with DESx2 to in 08/2017   Daily headache    "since I started Plavix" (08/25/2017)   Depression    Diabetes mellitus type 2 in obese (HCC)    Hyperlipidemia    Hypertension    OSA on CPAP      No Known Allergies   Current Outpatient Medications  Medication Sig Dispense Refill   amLODipine (NORVASC) 2.5 MG tablet Take 1  tablet (2.5 mg total) by mouth daily. 90 tablet 3   Ascorbic Acid (VITAMIN C) 1000 MG tablet Take 2,000 mg by mouth daily.     aspirin EC 81 MG tablet Take 81 mg by mouth daily.      atorvastatin (LIPITOR) 80 MG tablet Take 1 tablet (80 mg total) by mouth daily. 90 tablet 3   Dulaglutide (TRULICITY) 4.5 0000000 SOPN INJECT 4.5 MG AS DIRECTED ONCE A WEEK 6 mL 0   JARDIANCE 10 MG TABS tablet Take 10 mg by mouth daily.     lisinopril (ZESTRIL) 5 MG tablet Take 1 tablet (5 mg total) by mouth daily. 90 tablet 3   metFORMIN (GLUCOPHAGE XR) 500 MG 24 hr tablet Take 2 tablets (1,000 mg total) by mouth in the morning and at bedtime. 360 tablet 3   metoprolol succinate (TOPROL-XL) 50 MG 24 hr tablet TAKE 1 TABLET BY MOUTH EVERY DAY WITH OR IMMEDIATELY FOLLOWING A MEAL 90 tablet 3   nitroGLYCERIN (NITROSTAT) 0.4 MG SL tablet Place 1 tablet (0.4 mg total) under the tongue every 5 (five) minutes x 3 doses as needed for chest pain (if no relief after 2nd dose, proceed to ED or call 911). 25 tablet 3   omeprazole (PRILOSEC)  40 MG capsule Take 1 capsule (40 mg total) by mouth daily. 90 capsule 3   No current facility-administered medications for this visit.     Past Surgical History:  Procedure Laterality Date   CORONARY CTO INTERVENTION N/A 08/25/2017   Procedure: CORONARY CTO INTERVENTION;  Surgeon: Martinique, Peter M, MD;  Location: [place] [facility];  Service: Cardiovascular;  Laterality: N/A;   LEFT HEART CATH AND CORONARY ANGIOGRAPHY N/A 07/21/2017   Procedure: LEFT HEART CATH AND CORONARY ANGIOGRAPHY;  Surgeon: Belva Crome, MD;  Location: [facility];  Service: Cardiovascular;  Laterality: N/A;   MOLE REMOVAL Right    "chin"   TUMOR EXCISION Left 1991   knee area     No Known Allergies    Family History  Problem Relation Age of Onset   Diabetes Mother    Cancer Mother    Diabetes Father      Social History Carl [name] reports that he has been smoking cigarettes. He started  smoking about 29 years ago. He has a 24.00 pack-year smoking history. He has never used smokeless tobacco. Carl [name] reports current alcohol use.   Review of Systems CONSTITUTIONAL: No weight loss, fever, chills, weakness or fatigue.  HEENT: Eyes: No visual loss, blurred vision, double vision or yellow sclerae.No hearing loss, sneezing, congestion, runny nose or sore throat.  SKIN: No rash or itching.  CARDIOVASCULAR: per hpi RESPIRATORY: No shortness of breath, cough or sputum.  GASTROINTESTINAL: No anorexia, nausea, vomiting or diarrhea. No abdominal pain or blood.  GENITOURINARY: No burning on urination, no polyuria NEUROLOGICAL: No headache, dizziness, syncope, paralysis, ataxia, numbness or tingling in the extremities. No change in bowel or bladder control.  MUSCULOSKELETAL: No muscle, back pain, joint pain or stiffness.  LYMPHATICS: No enlarged nodes. No history of splenectomy.  PSYCHIATRIC: No history of depression or anxiety.  ENDOCRINOLOGIC: No reports of sweating, cold or heat intolerance. No polyuria or polydipsia.  Marland Kitchen   Physical Examination Today's Vitals   05/28/22 0910  BP: 122/80  Pulse: 88  SpO2: 95%  Weight: 264 lb 6.4 oz (119.9 kg)  Height: 5\' 11"  (1.803 m)   Body mass index is 36.88 kg/m.  Gen: resting comfortably, no acute distress HEENT: no scleral icterus, pupils equal round and reactive, no palptable cervical adenopathy,  CV: RRR, no mrg, no jvd Resp: Clear to auscultation bilaterally GI: abdomen is soft, non-tender, non-distended, normal bowel sounds, no hepatosplenomegaly MSK: extremities are warm, no edema.  Skin: warm, no rash Neuro:  no focal deficits Psych: appropriate affect   Diagnostic Studies  05/2020 GXT Patient achieved a maximum workload of 7 METS, MPHR 86%. There were equivocal ST segment changes, 0.5 mm ST segment depression in the inferior leads. Nonlimiting, vague chest discomfort reported. Hypertensive response noted to exercise.  There were no arrhythmias. Intermediate Duke treadmill score of -1. Blood pressure demonstrated a hypertensive response to exercise.     07/2021 nuclear stress Exercise sterss test:  Clinically negative, electrically positive with ST depression in inferior leads that improved in the recovery period   Myoview scan with ischemia in the distal anterior and apical walls   LVEF on gating calculated at 51% wiht mild hypokinesis of the distal anterior wall   Low to intermediate risk scan.  Compared to myoview scan done in 2018, defect in similar distribution but less severe in current study.   Assessment and Plan  1. CAD with chronic stable angina - infrequent mild chest pain at times, recent  stress test mild ischemia.  - chronic stable infrequent symptoms, continue current meds   2. Hyperlipidema -LDL remains at goal, continue atorvastaitn. Contineu working on diety to lower TGs   3. HTN - he is at goal  F/u 6 months      Arnoldo Lenis, M.D.

## 2022-05-28 NOTE — Patient Instructions (Addendum)
Medication Instructions:  Continue all current medications.   Labwork: none  Testing/Procedures: none  Follow-Up: 6 months   Any Other Special Instructions Will Be Listed Below (If Applicable).   If you need a refill on your cardiac medications before your next appointment, please call your pharmacy.  

## 2022-06-25 ENCOUNTER — Ambulatory Visit: Payer: BC Managed Care – PPO | Admitting: Family Medicine

## 2022-06-25 ENCOUNTER — Encounter: Payer: Self-pay | Admitting: Family Medicine

## 2022-06-25 VITALS — BP 124/82 | HR 100 | Ht 71.0 in | Wt 257.0 lb

## 2022-06-25 DIAGNOSIS — E785 Hyperlipidemia, unspecified: Secondary | ICD-10-CM

## 2022-06-25 DIAGNOSIS — E1169 Type 2 diabetes mellitus with other specified complication: Secondary | ICD-10-CM | POA: Diagnosis not present

## 2022-06-25 DIAGNOSIS — I152 Hypertension secondary to endocrine disorders: Secondary | ICD-10-CM

## 2022-06-25 DIAGNOSIS — E1159 Type 2 diabetes mellitus with other circulatory complications: Secondary | ICD-10-CM | POA: Diagnosis not present

## 2022-06-25 DIAGNOSIS — E118 Type 2 diabetes mellitus with unspecified complications: Secondary | ICD-10-CM

## 2022-06-25 LAB — BAYER DCA HB A1C WAIVED: HB A1C (BAYER DCA - WAIVED): 5.9 % — ABNORMAL HIGH (ref 4.8–5.6)

## 2022-06-25 MED ORDER — METFORMIN HCL ER 500 MG PO TB24: 1000.0000 mg | ORAL_TABLET | Freq: Two times a day (BID) | ORAL | 3 refills | Status: DC

## 2022-06-25 MED ORDER — TRULICITY 4.5 MG/0.5ML ~~LOC~~ SOAJ: 4.5000 mg | SUBCUTANEOUS | 3 refills | Status: DC

## 2022-06-25 MED ORDER — LISINOPRIL 5 MG PO TABS: 5.0000 mg | ORAL_TABLET | Freq: Every day | ORAL | 3 refills | Status: DC

## 2022-06-25 NOTE — Progress Notes (Signed)
BP 124/82   Pulse 100   Ht 5\' 11"  (1.803 m)   Wt 257 lb (116.6 kg)   SpO2 97%   BMI 35.84 kg/m    Subjective:   Patient ID: Carl [name], male    DOB: 1975-08-26, 47 y.o.   MRN: [id number]  HPI: Carl [name] is a 47 y.o. male presenting on 06/25/2022 for Medical Management of Chronic Issues and Diabetes   HPI Type 2 diabetes mellitus Patient comes in today for recheck of his diabetes. Patient has been currently taking Trulicity and Jardiance and metformin. Patient is currently on an ACE inhibitor/ARB. Patient has not seen an ophthalmologist this year. Patient denies any new issues with their feet. The symptom started onset as an adult hypertension and hyperlipidemia and CAD ARE RELATED TO DM   Hypertension Patient is currently on amlodipine and lisinopril and metoprolol, and their blood pressure today is 124/82. Patient denies any lightheadedness or dizziness. Patient denies headaches, blurred vision, chest pains, shortness of breath, or weakness. Denies any side effects from medication and is content with current medication.   Hyperlipidemia Patient is coming in for recheck of his hyperlipidemia. The patient is currently taking atorvastatin. They deny any issues with myalgias or history of liver damage from it. They deny any focal numbness or weakness or chest pain.   Relevant past medical, surgical, family and social history reviewed and updated as indicated. Interim medical history since our last visit reviewed. Allergies and medications reviewed and updated.  Review of Systems  Constitutional:  Negative for chills and fever.  Respiratory:  Negative for shortness of breath and wheezing.   Cardiovascular:  Negative for chest pain and leg swelling.  Musculoskeletal:  Negative for back pain and gait problem.  Skin:  Negative for rash.  Neurological:  Negative for dizziness, weakness and numbness.  All other systems reviewed and are negative.   Per HPI unless specifically  indicated above   Allergies as of 06/25/2022   No Known Allergies      Medication List        Accurate as of Jun 25, 2022  1:47 PM. If you have any questions, ask your nurse or doctor.          amLODipine 2.5 MG tablet Commonly known as: NORVASC Take 1 tablet (2.5 mg total) by mouth daily.   aspirin EC 81 MG tablet Take 81 mg by mouth daily.   atorvastatin 80 MG tablet Commonly known as: LIPITOR Take 1 tablet (80 mg total) by mouth daily.   Jardiance 10 MG Tabs tablet Generic drug: empagliflozin Take 10 mg by mouth daily.   lisinopril 5 MG tablet Commonly known as: ZESTRIL Take 1 tablet (5 mg total) by mouth daily.   metFORMIN 500 MG 24 hr tablet Commonly known as: Glucophage XR Take 2 tablets (1,000 mg total) by mouth in the morning and at bedtime.   metoprolol succinate 50 MG 24 hr tablet Commonly known as: TOPROL-XL TAKE 1 TABLET BY MOUTH EVERY DAY WITH OR IMMEDIATELY FOLLOWING A MEAL   nitroGLYCERIN 0.4 MG SL tablet Commonly known as: NITROSTAT Place 1 tablet (0.4 mg total) under the tongue every 5 (five) minutes x 3 doses as needed for chest pain (if no relief after 2nd dose, proceed to ED or call 911).   omeprazole 40 MG capsule Commonly known as: PRILOSEC Take 1 capsule (40 mg total) by mouth daily.   Trulicity 4.5 MG/0.5ML Sopn Generic drug: Dulaglutide Inject 4.5 mg as directed  once a week.   vitamin C 1000 MG tablet Take 2,000 mg by mouth daily.         Objective:   BP 124/82   Pulse 100   Ht 5\' 11"  (1.803 m)   Wt 257 lb (116.6 kg)   SpO2 97%   BMI 35.84 kg/m   Wt Readings from Last 3 Encounters:  06/25/22 257 lb (116.6 kg)  05/28/22 264 lb 6.4 oz (119.9 kg)  04/03/22 269 lb (122 kg)    Physical Exam Vitals and nursing note reviewed.  Constitutional:      General: He is not in acute distress.    Appearance: He is well-developed. He is not diaphoretic.  Eyes:     General: No scleral icterus.    Conjunctiva/sclera:  Conjunctivae normal.  Neck:     Thyroid: No thyromegaly.  Cardiovascular:     Rate and Rhythm: Normal rate and regular rhythm.     Heart sounds: Normal heart sounds. No murmur heard. Pulmonary:     Effort: Pulmonary effort is normal. No respiratory distress.     Breath sounds: Normal breath sounds. No wheezing.  Musculoskeletal:        General: No swelling. Normal range of motion.     Cervical back: Neck supple.  Lymphadenopathy:     Cervical: No cervical adenopathy.  Skin:    General: Skin is warm and dry.     Findings: No rash.  Neurological:     Mental Status: He is alert and oriented to person, place, and time.     Coordination: Coordination normal.  Psychiatric:        Behavior: Behavior normal.       Assessment & Plan:   Problem List Items Addressed This Visit       Cardiovascular and Mediastinum   Hypertension associated with diabetes (HCC)   Relevant Medications   Dulaglutide (TRULICITY) 4.5 MG/0.5ML SOPN   lisinopril (ZESTRIL) 5 MG tablet   metFORMIN (GLUCOPHAGE XR) 500 MG 24 hr tablet   Other Relevant Orders   CBC with Differential/Platelet   CMP14+EGFR   Lipid panel   Bayer DCA Hb A1c Waived     Endocrine   Hyperlipidemia associated with type 2 diabetes mellitus (HCC)   Relevant Medications   Dulaglutide (TRULICITY) 4.5 MG/0.5ML SOPN   lisinopril (ZESTRIL) 5 MG tablet   metFORMIN (GLUCOPHAGE XR) 500 MG 24 hr tablet   Other Relevant Orders   CBC with Differential/Platelet   CMP14+EGFR   Lipid panel   Bayer DCA Hb A1c Waived   Type 2 diabetes mellitus with other specified complication (HCC) - Primary   Relevant Medications   Dulaglutide (TRULICITY) 4.5 MG/0.5ML SOPN   lisinopril (ZESTRIL) 5 MG tablet   metFORMIN (GLUCOPHAGE XR) 500 MG 24 hr tablet   Other Relevant Orders   CBC with Differential/Platelet   CMP14+EGFR   Lipid panel   Bayer DCA Hb A1c Waived   Other Visit Diagnoses     Controlled type 2 diabetes mellitus with complication,  without long-term current use of insulin (HCC)       Relevant Medications   Dulaglutide (TRULICITY) 4.5 MG/0.5ML SOPN   lisinopril (ZESTRIL) 5 MG tablet   metFORMIN (GLUCOPHAGE XR) 500 MG 24 hr tablet     A1c 5.9, looks good, he was cut back on the metformin and will go down to 1000 mg a day  Blood pressure everything else looks good continue current medicine.  Follow up plan: Return in about 3 months (  around 09/25/2022), or if symptoms worsen or fail to improve, for Diabetes and hypertension and hyperlipidemia.  Counseling provided for all of the vaccine components Orders Placed This Encounter  Procedures   CBC with Differential/Platelet   CMP14+EGFR   Lipid panel   Bayer DCA Hb A1c Waived    Arville Care, MD [facility] 06/25/2022, 1:47 PM

## 2022-06-26 LAB — LIPID PANEL
Chol/HDL Ratio: 3.5 ratio (ref 0.0–5.0)
Cholesterol, Total: 104 mg/dL (ref 100–199)
HDL: 30 mg/dL — ABNORMAL LOW (ref >39)
LDL Chol Calc (NIH): 53 mg/dL (ref 0–99)
Triglycerides: 117 mg/dL (ref 0–149)
VLDL Cholesterol Cal: 21 mg/dL (ref 5–40)

## 2022-06-26 LAB — CMP14+EGFR
ALT: 25 IU/L (ref 0–44)
AST: 21 IU/L (ref 0–40)
Albumin/Globulin Ratio: 1.6 (ref 1.2–2.2)
Albumin: 4.6 g/dL (ref 4.1–5.1)
Alkaline Phosphatase: 89 IU/L (ref 44–121)
BUN/Creatinine Ratio: 20 (ref 9–20)
BUN: 19 mg/dL (ref 6–24)
Bilirubin Total: 0.8 mg/dL (ref 0.0–1.2)
CO2: 20 mmol/L (ref 20–29)
Calcium: 10.1 mg/dL (ref 8.7–10.2)
Chloride: 101 mmol/L (ref 96–106)
Creatinine, Ser: 0.95 mg/dL (ref 0.76–1.27)
Globulin, Total: 2.8 g/dL (ref 1.5–4.5)
Glucose: 80 mg/dL (ref 70–99)
Potassium: 4.4 mmol/L (ref 3.5–5.2)
Sodium: 140 mmol/L (ref 134–144)
Total Protein: 7.4 g/dL (ref 6.0–8.5)
eGFR: 99 mL/min/{1.73_m2} (ref >59)

## 2022-06-26 LAB — CBC WITH DIFFERENTIAL/PLATELET
Basophils Absolute: 0 10*3/uL (ref 0.0–0.2)
Basos: 0 %
EOS (ABSOLUTE): 0.1 10*3/uL (ref 0.0–0.4)
Eos: 1 %
Hematocrit: 45.4 % (ref 37.5–51.0)
Hemoglobin: 15.4 g/dL (ref 13.0–17.7)
Immature Grans (Abs): 0 10*3/uL (ref 0.0–0.1)
Immature Granulocytes: 0 %
Lymphocytes Absolute: 3.4 10*3/uL — ABNORMAL HIGH (ref 0.7–3.1)
Lymphs: 23 %
MCH: 31 pg (ref 26.6–33.0)
MCHC: 33.9 g/dL (ref 31.5–35.7)
MCV: 91 fL (ref 79–97)
Monocytes Absolute: 1.3 10*3/uL — ABNORMAL HIGH (ref 0.1–0.9)
Monocytes: 9 %
Neutrophils Absolute: 9.8 10*3/uL — ABNORMAL HIGH (ref 1.4–7.0)
Neutrophils: 67 %
Platelets: 231 10*3/uL (ref 150–450)
RBC: 4.97 x10E6/uL (ref 4.14–5.80)
RDW: 13.1 % (ref 11.6–15.4)
WBC: 14.7 10*3/uL — ABNORMAL HIGH (ref 3.4–10.8)

## 2022-08-09 ENCOUNTER — Other Ambulatory Visit: Payer: Self-pay | Admitting: Cardiology

## 2022-08-26 ENCOUNTER — Telehealth: Payer: Self-pay | Admitting: Family Medicine

## 2022-08-26 DIAGNOSIS — E1169 Type 2 diabetes mellitus with other specified complication: Secondary | ICD-10-CM

## 2022-08-26 MED ORDER — TIRZEPATIDE 7.5 MG/0.5ML ~~LOC~~ SOAJ: 7.5000 mg | SUBCUTANEOUS | 3 refills | Status: DC

## 2022-08-26 MED ORDER — SEMAGLUTIDE (2 MG/DOSE) 8 MG/3ML ~~LOC~~ SOPN: 2.0000 mg | PEN_INJECTOR | SUBCUTANEOUS | 3 refills | Status: DC

## 2022-08-26 NOTE — Telephone Encounter (Signed)
Patient calling because he was told Trulicity is not going to be made anymore and he is out. Needs to know what he needs to do.

## 2022-08-26 NOTE — Telephone Encounter (Signed)
Yes realistically they are starting to phase out Trulicity, Ozempic is another option that we are commonly going to, if that is not covered well by the insurance then [facility] is another 1 that we could try in the future as well.  I sent Ozempic to the pharmacy for you

## 2022-08-26 NOTE — Telephone Encounter (Signed)
Per Dr Dettinger ok to send Mounjaro 7.5 weekly

## 2022-09-07 ENCOUNTER — Telehealth: Payer: Self-pay

## 2022-09-07 NOTE — Telephone Encounter (Signed)
Transition Care Management Follow-up Telephone Call Date of discharge and from where: 09/06/2022 [facility] ED  How have you been since you were released from the hospital? Patient states that he is doing well. Dizziness believed to be caused by new medication Mounjaro. Patient states that symptoms have resloved Any questions or concerns? No  Items Reviewed: Did the pt receive and understand the discharge instructions provided? Yes  Medications obtained and verified? Yes  Any new allergies since your discharge? No  Dietary orders reviewed? Yes Do you have support at home? Yes   Home Care and Equipment/Supplies: Were home health services ordered? not applicable If so, what is the name of the agency? NA  Has the agency set up a time to come to the patient's home? not applicable Were any new equipment or medical supplies ordered?  No What is the name of the medical supply agency? NA Were you able to get the supplies/equipment? not applicable Do you have any questions related to the use of the equipment or supplies? No  Functional Questionnaire: (I = Independent and D = Dependent) ADLs: i  Bathing/Dressing- i  Meal Prep- i  Eating- i  Maintaining continence- i  Transferring/Ambulation- i  Managing Meds- i  Follow up appointments reviewed:  PCP Hospital f/u appt confirmed? Yes  Scheduled to see 09/24/2022 on Dettinger @ 1255 for chronic follow up - patient will call if needs follow up sooner. Specialist Hospital f/u appt confirmed? No   Are transportation arrangements needed? No  If their condition worsens, is the pt aware to call PCP or go to the Emergency Dept.? Yes Was the patient provided with contact information for the PCP's office or ED? Yes Was to pt encouraged to call back with questions or concerns? Yes

## 2022-09-24 ENCOUNTER — Ambulatory Visit: Payer: BC Managed Care – PPO | Admitting: Family Medicine

## 2022-09-24 ENCOUNTER — Encounter: Payer: Self-pay | Admitting: Family Medicine

## 2022-09-24 VITALS — BP 119/78 | HR 73 | Ht 71.0 in | Wt 249.0 lb

## 2022-09-24 DIAGNOSIS — I152 Hypertension secondary to endocrine disorders: Secondary | ICD-10-CM | POA: Diagnosis not present

## 2022-09-24 DIAGNOSIS — E1159 Type 2 diabetes mellitus with other circulatory complications: Secondary | ICD-10-CM | POA: Diagnosis not present

## 2022-09-24 DIAGNOSIS — E785 Hyperlipidemia, unspecified: Secondary | ICD-10-CM

## 2022-09-24 DIAGNOSIS — E1169 Type 2 diabetes mellitus with other specified complication: Secondary | ICD-10-CM | POA: Diagnosis not present

## 2022-09-24 LAB — BAYER DCA HB A1C WAIVED: HB A1C (BAYER DCA - WAIVED): 5.7 % — ABNORMAL HIGH (ref 4.8–5.6)

## 2022-09-24 NOTE — Progress Notes (Signed)
BP 119/78   Pulse 73   Ht 5\' 11"  (1.803 m)   Wt 249 lb (112.9 kg)   SpO2 98%   BMI 34.73 kg/m    Subjective:   Patient ID: Carl [name], male    DOB: 08/24/1975, 47 y.o.   MRN: [id number]  HPI: Carl [name] is a 47 y.o. male presenting on 09/24/2022 for Medical Management of Chronic Issues and Diabetes   HPI Type 2 diabetes mellitus Patient comes in today for recheck of his diabetes. Patient has been currently taking metformin and Mounjaro and Jardiance. Patient is currently on an ACE inhibitor/ARB. Patient has not seen an ophthalmologist this year. Patient denies any new issues with their feet. The symptom started onset as an adult hypertension and hyperlipidemia ARE RELATED TO DM   Hypertension Patient is currently on amlodipine and lisinopril metoprolol, and their blood pressure today is 119/78. Patient denies any lightheadedness or dizziness. Patient denies headaches, blurred vision, chest pains, shortness of breath, or weakness. Denies any side effects from medication and is content with current medication.   Hyperlipidemia Patient is coming in for recheck of his hyperlipidemia. The patient is currently taking atorvastatin. They deny any issues with myalgias or history of liver damage from it. They deny any focal numbness or weakness or chest pain.   Relevant past medical, surgical, family and social history reviewed and updated as indicated. Interim medical history since our last visit reviewed. Allergies and medications reviewed and updated.  Review of Systems  Constitutional:  Negative for chills and fever.  Eyes:  Negative for visual disturbance.  Respiratory:  Negative for shortness of breath and wheezing.   Cardiovascular:  Negative for chest pain and leg swelling.  Musculoskeletal:  Negative for back pain and gait problem.  Skin:  Negative for rash.  Neurological:  Negative for dizziness, weakness and light-headedness.  All other systems reviewed and are  negative.   Per HPI unless specifically indicated above   Allergies as of 09/24/2022   No Known Allergies      Medication List        Accurate as of September 24, 2022  1:07 PM. If you have any questions, ask your nurse or doctor.          amLODipine 2.5 MG tablet Commonly known as: NORVASC Take 1 tablet (2.5 mg total) by mouth daily.   aspirin EC 81 MG tablet Take 81 mg by mouth daily.   atorvastatin 80 MG tablet Commonly known as: LIPITOR Take 1 tablet (80 mg total) by mouth daily.   Jardiance 10 MG Tabs tablet Generic drug: empagliflozin Take 10 mg by mouth daily.   lisinopril 5 MG tablet Commonly known as: ZESTRIL Take 1 tablet (5 mg total) by mouth daily.   metFORMIN 500 MG 24 hr tablet Commonly known as: Glucophage XR Take 2 tablets (1,000 mg total) by mouth in the morning and at bedtime.   metoprolol succinate 50 MG 24 hr tablet Commonly known as: TOPROL-XL TAKE 1 TABLET BY MOUTH EVERY DAY with OR immediately AFTER a meal   nitroGLYCERIN 0.4 MG SL tablet Commonly known as: NITROSTAT Place 1 tablet (0.4 mg total) under the tongue every 5 (five) minutes x 3 doses as needed for chest pain (if no relief after 2nd dose, proceed to ED or call 911).   omeprazole 40 MG capsule Commonly known as: PRILOSEC Take 1 capsule (40 mg total) by mouth daily.   tirzepatide 7.5 MG/0.5ML Pen Commonly known as: [employer]  Inject 7.5 mg into the skin once a week.   vitamin C 1000 MG tablet Take 2,000 mg by mouth daily.         Objective:   BP 119/78   Pulse 73   Ht 5\' 11"  (1.803 m)   Wt 249 lb (112.9 kg)   SpO2 98%   BMI 34.73 kg/m   Wt Readings from Last 3 Encounters:  09/24/22 249 lb (112.9 kg)  06/25/22 257 lb (116.6 kg)  05/28/22 264 lb 6.4 oz (119.9 kg)    Physical Exam Vitals and nursing note reviewed.  Constitutional:      General: He is not in acute distress.    Appearance: He is well-developed. He is not diaphoretic.  Eyes:     General: No  scleral icterus.    Conjunctiva/sclera: Conjunctivae normal.  Neck:     Thyroid: No thyromegaly.  Cardiovascular:     Rate and Rhythm: Normal rate and regular rhythm.     Heart sounds: Normal heart sounds. No murmur heard. Pulmonary:     Effort: Pulmonary effort is normal. No respiratory distress.     Breath sounds: Normal breath sounds. No wheezing.  Musculoskeletal:        General: No swelling. Normal range of motion.     Cervical back: Neck supple.  Lymphadenopathy:     Cervical: No cervical adenopathy.  Skin:    General: Skin is warm and dry.     Findings: No rash.  Neurological:     Mental Status: He is alert and oriented to person, place, and time.     Coordination: Coordination normal.  Psychiatric:        Behavior: Behavior normal.       Assessment & Plan:   Problem List Items Addressed This Visit       Cardiovascular and Mediastinum   Hypertension associated with diabetes (HCC)     Endocrine   Hyperlipidemia associated with type 2 diabetes mellitus (HCC)   Type 2 diabetes mellitus with other specified complication (HCC) - Primary   Relevant Orders   CBC with Differential/Platelet   Bayer DCA Hb A1c Waived  A1c is 5.7, looks good.  Mainly focus on diet.  He said he was eating some sugary things but it seems like the Greggory Keen is controlling it.  Blood pressure everything else looks good today  Follow up plan: Return in about 3 months (around 12/25/2022), or if symptoms worsen or fail to improve, for Diabetes and hypertension and cholesterol.  Counseling provided for all of the vaccine components Orders Placed This Encounter  Procedures   CBC with Differential/Platelet   Bayer DCA Hb A1c Waived    Arville Care, MD [facility] 09/24/2022, 1:07 PM

## 2022-12-03 ENCOUNTER — Ambulatory Visit: Payer: BC Managed Care – PPO | Attending: Cardiology | Admitting: Cardiology

## 2022-12-03 ENCOUNTER — Encounter: Payer: Self-pay | Admitting: Cardiology

## 2022-12-03 VITALS — BP 122/80 | HR 81 | Ht 70.0 in | Wt 250.0 lb

## 2022-12-03 DIAGNOSIS — E782 Mixed hyperlipidemia: Secondary | ICD-10-CM | POA: Diagnosis not present

## 2022-12-03 DIAGNOSIS — I25118 Atherosclerotic heart disease of native coronary artery with other forms of angina pectoris: Secondary | ICD-10-CM

## 2022-12-03 DIAGNOSIS — I10 Essential (primary) hypertension: Secondary | ICD-10-CM

## 2022-12-03 NOTE — Progress Notes (Signed)
Clinical Summary Carl [name] is a 47 y.o.male seen today for follow up of the following medical problem.s    1. CAD with chronic stable angina - abnormal exericse stress in 2015. - cath 2015 at [facility] with occluded proximal LAD, LAD filled with left-left and right-left collaterals. Medically managed  - echo Jan 2018 LVEF 55-60%     - nuclear stress Jan 2018 showed apical and distal anterolateral infarct with ischemia. Overall intermediate to high risk.      06/2017 cath with prox LAD 100%, RCA prox 50%, RPDA 40%, norma LCX - he was referred for evaluation for CTO intervention on his LAD - 08/2017 succesful CTO PCI of LAD with DES x 2.    Imdur stopped that admission due to headaches.    07/2021 nuclear stress: inferior st depressions, ischemia distal anterior and apical walls. Rated as low to intermediate risk. SDS 4.     - last visit we started norvasc 2.5mg  daily as additional antianginal  - chronic stable chest pains. Overall infrequent   - mild infrequent symptoms, overall stable - compliant with meds   2. Hyperlipidemia -05/2019 TC 116 TG 208 HDL 27 LDL 55 - compliant with statin 03/2021 TC 99 TG 141 HDL 27 LDL 47  12/2021 TC 130 TG 865 HDL 23 LDL 44 - 06/2022 TC 784 TG 696 HDL 30 LDL 53   3. HTN - he is compliant with meds   4. DM2 - 09/2022 A1c 5.7 - over the last year roughly 60-70 lbs weight loss.      SH: works at [employer]  just bought a house Past Medical History:  Diagnosis Date   Anxiety    CAD in native artery    a. cath in 2015 [facility] showing occluded proximal LAD, LAD filled with left-left and right-left collaterals, otherwise 20-30% prox-mid RCA, LVEF 55%. b. Abnormal nuc 02/2016 - [facility] medically. c. redo cath in 06/2017 showing 100% Prox LAD stenosis, 50% RCA, and 40% RPDA and underwent CTO PCI of the LAD with DESx2 to in 08/2017   Daily headache    "since I started Plavix" (08/25/2017)   Depression    Diabetes mellitus type 2 in obese     Hyperlipidemia    Hypertension    OSA on CPAP      No Known Allergies   Current Outpatient Medications  Medication Sig Dispense Refill   amLODipine (NORVASC) 2.5 MG tablet Take 1 tablet (2.5 mg total) by mouth daily. 90 tablet 3   Ascorbic Acid (VITAMIN C) 1000 MG tablet Take 2,000 mg by mouth daily.     aspirin EC 81 MG tablet Take 81 mg by mouth daily.      atorvastatin (LIPITOR) 80 MG tablet Take 1 tablet (80 mg total) by mouth daily. 90 tablet 3   JARDIANCE 10 MG TABS tablet Take 10 mg by mouth daily.     lisinopril (ZESTRIL) 5 MG tablet Take 1 tablet (5 mg total) by mouth daily. 90 tablet 3   metFORMIN (GLUCOPHAGE XR) 500 MG 24 hr tablet Take 2 tablets (1,000 mg total) by mouth in the morning and at bedtime. 360 tablet 3   metoprolol succinate (TOPROL-XL) 50 MG 24 hr tablet TAKE 1 TABLET BY MOUTH EVERY DAY with OR immediately AFTER a meal 90 tablet 3   nitroGLYCERIN (NITROSTAT) 0.4 MG SL tablet Place 1 tablet (0.4 mg total) under the tongue every 5 (five) minutes x 3 doses as needed for chest  pain (if no relief after 2nd dose, proceed to ED or call 911). 25 tablet 3   omeprazole (PRILOSEC) 40 MG capsule Take 1 capsule (40 mg total) by mouth daily. 90 capsule 3   tirzepatide (MOUNJARO) 7.5 MG/0.5ML Pen Inject 7.5 mg into the skin once a week. 6 mL 3   No current facility-administered medications for this visit.     Past Surgical History:  Procedure Laterality Date   CORONARY CTO INTERVENTION N/A 08/25/2017   Procedure: CORONARY CTO INTERVENTION;  Surgeon: [place], Peter M, MD;  Location: [facility] INVASIVE CV LAB;  Service: Cardiovascular;  Laterality: N/A;   LEFT HEART CATH AND CORONARY ANGIOGRAPHY N/A 07/21/2017   Procedure: LEFT HEART CATH AND CORONARY ANGIOGRAPHY;  Surgeon: Lyn Records, MD;  Location: [facility] INVASIVE CV LAB;  Service: Cardiovascular;  Laterality: N/A;   MOLE REMOVAL Right    "chin"   TUMOR EXCISION Left 1991   knee area     No Known Allergies    Family History   Problem Relation Age of Onset   Diabetes Mother    Cancer Mother    Diabetes Father      Social History Carl [name] reports that he has been smoking cigarettes. He started smoking about 29 years ago. He has a 29.9 pack-year smoking history. He has never used smokeless tobacco. Carl [name] reports current alcohol use.   Review of Systems CONSTITUTIONAL: No weight loss, fever, chills, weakness or fatigue.  HEENT: Eyes: No visual loss, blurred vision, double vision or yellow sclerae.No hearing loss, sneezing, congestion, runny nose or sore throat.  SKIN: No rash or itching.  CARDIOVASCULAR: per hpi RESPIRATORY: No shortness of breath, cough or sputum.  GASTROINTESTINAL: No anorexia, nausea, vomiting or diarrhea. No abdominal pain or blood.  GENITOURINARY: No burning on urination, no polyuria NEUROLOGICAL: No headache, dizziness, syncope, paralysis, ataxia, numbness or tingling in the extremities. No change in bowel or bladder control.  MUSCULOSKELETAL: No muscle, back pain, joint pain or stiffness.  LYMPHATICS: No enlarged nodes. No history of splenectomy.  PSYCHIATRIC: No history of depression or anxiety.  ENDOCRINOLOGIC: No reports of sweating, cold or heat intolerance. No polyuria or polydipsia.  Marland Kitchen   Physical Examination Today's Vitals   12/03/22 0845  BP: 122/80  Pulse: 81  SpO2: 99%  Weight: 250 lb (113.4 kg)  Height: 5\' 10"  (1.778 m)   Body mass index is 35.87 kg/m.  Gen: resting comfortably, no acute distress HEENT: no scleral icterus, pupils equal round and reactive, no palptable cervical adenopathy,  CV: RRR, no mrg, no jvd Resp: Clear to auscultation bilaterally GI: abdomen is soft, non-tender, non-distended, normal bowel sounds, no hepatosplenomegaly MSK: extremities are warm, no edema.  Skin: warm, no rash Neuro:  no focal deficits Psych: appropriate affect   Diagnostic Studies  05/2020 GXT Patient achieved a maximum workload of 7 METS, MPHR 86%. There were  equivocal ST segment changes, 0.5 mm ST segment depression in the inferior leads. Nonlimiting, vague chest discomfort reported. Hypertensive response noted to exercise. There were no arrhythmias. Intermediate Duke treadmill score of -1. Blood pressure demonstrated a hypertensive response to exercise.     07/2021 nuclear stress Exercise sterss test:  Clinically negative, electrically positive with ST depression in inferior leads that improved in the recovery period   Myoview scan with ischemia in the distal anterior and apical walls   LVEF on gating calculated at 51% wiht mild hypokinesis of the distal anterior wall   Low to intermediate risk scan.  Compared to myoview scan done in 2018, defect in similar distribution but less severe in current study.   Assessment and Plan   1. CAD with chronic stable angina - infrequent mild chest pain at times, recent stress test mild ischemia.  - stable symptoms, we will continue current meds EKG SR, no acute ischemic changes   2. Hyperlipidema -LDL is at goal, conitnue current meds  3. HTN -at goal, continue curernt meds   F/u 04/2023     Antoine Poche, M.D

## 2022-12-03 NOTE — Patient Instructions (Signed)
Medication Instructions:   Continue all current medications.   Labwork:  none  Testing/Procedures:  none  Follow-Up:  March 2025  Any Other Special Instructions Will Be Listed Below (If Applicable).   If you need a refill on your cardiac medications before your next appointment, please call your pharmacy.

## 2022-12-14 ENCOUNTER — Other Ambulatory Visit: Payer: Self-pay | Admitting: Family Medicine

## 2022-12-14 DIAGNOSIS — K219 Gastro-esophageal reflux disease without esophagitis: Secondary | ICD-10-CM

## 2022-12-24 ENCOUNTER — Encounter: Payer: Self-pay | Admitting: Family Medicine

## 2022-12-31 ENCOUNTER — Encounter: Payer: Self-pay | Admitting: Family Medicine

## 2022-12-31 ENCOUNTER — Ambulatory Visit: Payer: BC Managed Care – PPO | Admitting: Family Medicine

## 2022-12-31 VITALS — BP 115/75 | HR 81 | Ht 70.0 in | Wt 253.0 lb

## 2022-12-31 DIAGNOSIS — Z7984 Long term (current) use of oral hypoglycemic drugs: Secondary | ICD-10-CM

## 2022-12-31 DIAGNOSIS — E1159 Type 2 diabetes mellitus with other circulatory complications: Secondary | ICD-10-CM

## 2022-12-31 DIAGNOSIS — I152 Hypertension secondary to endocrine disorders: Secondary | ICD-10-CM | POA: Diagnosis not present

## 2022-12-31 DIAGNOSIS — E1169 Type 2 diabetes mellitus with other specified complication: Secondary | ICD-10-CM | POA: Diagnosis not present

## 2022-12-31 DIAGNOSIS — K219 Gastro-esophageal reflux disease without esophagitis: Secondary | ICD-10-CM | POA: Diagnosis not present

## 2022-12-31 DIAGNOSIS — E785 Hyperlipidemia, unspecified: Secondary | ICD-10-CM

## 2022-12-31 LAB — BAYER DCA HB A1C WAIVED: HB A1C (BAYER DCA - WAIVED): 5.3 % (ref 4.8–5.6)

## 2022-12-31 MED ORDER — ATORVASTATIN CALCIUM 80 MG PO TABS: 80.0000 mg | ORAL_TABLET | Freq: Every day | ORAL | 3 refills | Status: DC

## 2022-12-31 MED ORDER — OMEPRAZOLE 40 MG PO CPDR: 40.0000 mg | DELAYED_RELEASE_CAPSULE | Freq: Every day | ORAL | 3 refills | Status: DC

## 2022-12-31 NOTE — Progress Notes (Signed)
BP 115/75   Pulse 81   Ht 5\' 10"  (1.778 m)   Wt 253 lb (114.8 kg)   SpO2 97%   BMI 36.30 kg/m    Subjective:   Patient ID: Carl [name], male    DOB: Nov 05, 1975, 47 y.o.   MRN: [id number]  HPI: Carl [name] is a 47 y.o. male presenting on 12/31/2022 for Medical Management of Chronic Issues, Diabetes, Hyperlipidemia, and Hypertension  Type 2 diabetes mellitus Patient comes in today for recheck of his diabetes. Patient is currently taking Mounjaro, metformin, and Jardiance. He does not monitor his blood glucose at home. Patient denies any hypoglycemic episodes. Patient has seen an ophthalmologist this year. Patient denies any new issues with his feet. His diabetes is complicated by hypertension and hyperlipidemia.   Hypertension Patient is currently taking amlodipine, lisinopril, and metoprolol. His blood pressure today is 115/75. He denies lightheadedness or dizziness, headaches, vision changes, or shortness of breath. He does have occasional chest discomfort, but this is monitored regularly by cardiology.  Hyperlipidemia Patient is currently taking atorvastatin. He denies myalgias or weakness. He does not have a history of liver damage from it.   Relevant past medical, surgical, family and social history reviewed and updated as indicated. Interim medical history since our last visit reviewed. Allergies and medications reviewed and updated.  Review of Systems  Constitutional:  Negative for chills and fever.  Eyes:  Negative for visual disturbance.  Respiratory:  Negative for cough, chest tightness and shortness of breath.   Cardiovascular:  Negative for palpitations and leg swelling.  Gastrointestinal:  Negative for abdominal pain, constipation and diarrhea.  Genitourinary:  Positive for urgency. Negative for difficulty urinating, dysuria and frequency.  Musculoskeletal:  Negative for myalgias.  Neurological:  Negative for dizziness, syncope, weakness, light-headedness and  headaches.    Per HPI unless specifically indicated above   Allergies as of 12/31/2022   No Known Allergies      Medication List        Accurate as of December 31, 2022  1:34 PM. If you have any questions, ask your nurse or doctor.          STOP taking these medications    metFORMIN 500 MG 24 hr tablet Commonly known as: Glucophage XR Stopped by: Elige Radon Zackery Brine       TAKE these medications    amLODipine 2.5 MG tablet Commonly known as: NORVASC Take 1 tablet (2.5 mg total) by mouth daily.   aspirin EC 81 MG tablet Take 81 mg by mouth daily.   atorvastatin 80 MG tablet Commonly known as: LIPITOR Take 1 tablet (80 mg total) by mouth daily.   Jardiance 10 MG Tabs tablet Generic drug: empagliflozin Take 10 mg by mouth daily.   lisinopril 5 MG tablet Commonly known as: ZESTRIL Take 1 tablet (5 mg total) by mouth daily.   metoprolol succinate 50 MG 24 hr tablet Commonly known as: TOPROL-XL TAKE 1 TABLET BY MOUTH EVERY DAY with OR immediately AFTER a meal   nitroGLYCERIN 0.4 MG SL tablet Commonly known as: NITROSTAT Place 1 tablet (0.4 mg total) under the tongue every 5 (five) minutes x 3 doses as needed for chest pain (if no relief after 2nd dose, proceed to ED or call 911).   omeprazole 40 MG capsule Commonly known as: PRILOSEC Take 1 capsule (40 mg total) by mouth daily.   tirzepatide 7.5 MG/0.5ML Pen Commonly known as: MOUNJARO Inject 7.5 mg into the skin once a week.  vitamin C 1000 MG tablet Take 2,000 mg by mouth daily.         Objective:   BP 115/75   Pulse 81   Ht 5\' 10"  (1.778 m)   Wt 253 lb (114.8 kg)   SpO2 97%   BMI 36.30 kg/m   Wt Readings from Last 3 Encounters:  12/31/22 253 lb (114.8 kg)  12/03/22 250 lb (113.4 kg)  09/24/22 249 lb (112.9 kg)    Physical Exam Vitals and nursing note reviewed.  Constitutional:      Appearance: Normal appearance. He is obese.  HENT:     Head: Normocephalic and atraumatic.      Right Ear: External ear normal.     Left Ear: External ear normal.  Eyes:     Conjunctiva/sclera: Conjunctivae normal.  Cardiovascular:     Rate and Rhythm: Normal rate and regular rhythm.     Heart sounds: Normal heart sounds.  Pulmonary:     Effort: Pulmonary effort is normal. No respiratory distress.     Breath sounds: Normal breath sounds.  Abdominal:     Tenderness: There is no right CVA tenderness or left CVA tenderness.  Musculoskeletal:     Cervical back: Normal range of motion and neck supple. No tenderness.     Right lower leg: No edema.     Left lower leg: No edema.  Skin:    General: Skin is warm and dry.  Neurological:     Mental Status: He is alert and oriented to person, place, and time.  Psychiatric:        Mood and Affect: Mood normal.        Behavior: Behavior normal.        Thought Content: Thought content normal.        Judgment: Judgment normal.    Assessment & Plan:   Problem List Items Addressed This Visit       Cardiovascular and Mediastinum   Hypertension associated with diabetes (HCC)   Relevant Medications   atorvastatin (LIPITOR) 80 MG tablet   Other Relevant Orders   CBC with Differential/Platelet   CMP14+EGFR   Lipid panel   Bayer DCA Hb A1c Waived     Digestive   GERD (gastroesophageal reflux disease)   Relevant Medications   omeprazole (PRILOSEC) 40 MG capsule     Endocrine   Hyperlipidemia associated with type 2 diabetes mellitus (HCC)   Relevant Medications   atorvastatin (LIPITOR) 80 MG tablet   Other Relevant Orders   CBC with Differential/Platelet   CMP14+EGFR   Lipid panel   Bayer DCA Hb A1c Waived   Type 2 diabetes mellitus with other specified complication (HCC) - Primary   Relevant Medications   atorvastatin (LIPITOR) 80 MG tablet   Other Relevant Orders   CBC with Differential/Platelet   CMP14+EGFR   Lipid panel   Bayer DCA Hb A1c Waived    Patient is doing well with no complaints. Blood pressure  well-controlled on current antihypertensive regimen. Will recheck CMP today. HgbA1c improved at 5.3%. Will taper metformin to 500 mg before completely discontinuing. Patient currently on high-intensity statin, so will also recheck lipid panel today.  Follow up plan: Return in about 3 months (around 04/02/2023), or if symptoms worsen or fail to improve, for dm.  Counseling provided for all of the vaccine components Orders Placed This Encounter  Procedures   CBC with Differential/Platelet   CMP14+EGFR   Lipid panel   Bayer DCA Hb A1c Waived  Gillermina Phy, Medical Student [facility] 12/31/2022, 1:34 PM  Patient seen and examined with Gillermina Phy, medical student, agree with assessment and plan above.  Will taper off metformin he will keep his other medicines.  He seems to be doing really well. Arville Care, MD [facility] 01/06/2023, 9:48 AM

## 2023-01-01 LAB — CMP14+EGFR
ALT: 20 [IU]/L (ref 0–44)
AST: 16 [IU]/L (ref 0–40)
Albumin: 4.2 g/dL (ref 4.1–5.1)
Alkaline Phosphatase: 73 [IU]/L (ref 44–121)
BUN/Creatinine Ratio: 13 (ref 9–20)
BUN: 12 mg/dL (ref 6–24)
Bilirubin Total: 0.5 mg/dL (ref 0.0–1.2)
CO2: 24 mmol/L (ref 20–29)
Calcium: 9.6 mg/dL (ref 8.7–10.2)
Chloride: 104 mmol/L (ref 96–106)
Creatinine, Ser: 0.93 mg/dL (ref 0.76–1.27)
Globulin, Total: 2.8 g/dL (ref 1.5–4.5)
Glucose: 87 mg/dL (ref 70–99)
Potassium: 4.3 mmol/L (ref 3.5–5.2)
Sodium: 141 mmol/L (ref 134–144)
Total Protein: 7 g/dL (ref 6.0–8.5)
eGFR: 101 mL/min/{1.73_m2} (ref >59)

## 2023-01-01 LAB — CBC WITH DIFFERENTIAL/PLATELET
Basophils Absolute: 0.1 10*3/uL (ref 0.0–0.2)
Basos: 1 %
EOS (ABSOLUTE): 0.2 10*3/uL (ref 0.0–0.4)
Eos: 2 %
Hematocrit: 43.6 % (ref 37.5–51.0)
Hemoglobin: 14.7 g/dL (ref 13.0–17.7)
Immature Grans (Abs): 0 10*3/uL (ref 0.0–0.1)
Immature Granulocytes: 0 %
Lymphocytes Absolute: 3 10*3/uL (ref 0.7–3.1)
Lymphs: 31 %
MCH: 32.3 pg (ref 26.6–33.0)
MCHC: 33.7 g/dL (ref 31.5–35.7)
MCV: 96 fL (ref 79–97)
Monocytes Absolute: 1 10*3/uL — ABNORMAL HIGH (ref 0.1–0.9)
Monocytes: 10 %
Neutrophils Absolute: 5.5 10*3/uL (ref 1.4–7.0)
Neutrophils: 56 %
Platelets: 198 10*3/uL (ref 150–450)
RBC: 4.55 x10E6/uL (ref 4.14–5.80)
RDW: 12.9 % (ref 11.6–15.4)
WBC: 9.8 10*3/uL (ref 3.4–10.8)

## 2023-01-01 LAB — LIPID PANEL
Chol/HDL Ratio: 3.2 ratio (ref 0.0–5.0)
Cholesterol, Total: 108 mg/dL (ref 100–199)
HDL: 34 mg/dL — ABNORMAL LOW (ref >39)
LDL Chol Calc (NIH): 53 mg/dL (ref 0–99)
Triglycerides: 116 mg/dL (ref 0–149)
VLDL Cholesterol Cal: 21 mg/dL (ref 5–40)

## 2023-01-06 ENCOUNTER — Other Ambulatory Visit: Payer: Self-pay | Admitting: Family Medicine

## 2023-03-14 ENCOUNTER — Other Ambulatory Visit: Payer: Self-pay | Admitting: Cardiology

## 2023-03-14 DIAGNOSIS — E1159 Type 2 diabetes mellitus with other circulatory complications: Secondary | ICD-10-CM

## 2023-04-08 ENCOUNTER — Ambulatory Visit: Payer: BC Managed Care – PPO | Admitting: Family Medicine

## 2023-04-08 ENCOUNTER — Encounter: Payer: Self-pay | Admitting: Family Medicine

## 2023-04-08 VITALS — BP 125/84 | HR 77 | Ht 70.0 in | Wt 245.0 lb

## 2023-04-08 DIAGNOSIS — E1159 Type 2 diabetes mellitus with other circulatory complications: Secondary | ICD-10-CM

## 2023-04-08 DIAGNOSIS — Z7984 Long term (current) use of oral hypoglycemic drugs: Secondary | ICD-10-CM

## 2023-04-08 DIAGNOSIS — I152 Hypertension secondary to endocrine disorders: Secondary | ICD-10-CM | POA: Diagnosis not present

## 2023-04-08 DIAGNOSIS — Z7985 Long-term (current) use of injectable non-insulin antidiabetic drugs: Secondary | ICD-10-CM

## 2023-04-08 DIAGNOSIS — E785 Hyperlipidemia, unspecified: Secondary | ICD-10-CM

## 2023-04-08 DIAGNOSIS — E1169 Type 2 diabetes mellitus with other specified complication: Secondary | ICD-10-CM | POA: Diagnosis not present

## 2023-04-08 LAB — LIPID PANEL

## 2023-04-08 LAB — BAYER DCA HB A1C WAIVED: HB A1C (BAYER DCA - WAIVED): 5.5 % (ref 4.8–5.6)

## 2023-04-08 MED ORDER — TIRZEPATIDE 7.5 MG/0.5ML ~~LOC~~ SOAJ: 7.5000 mg | SUBCUTANEOUS | 3 refills | Status: AC

## 2023-04-08 NOTE — Progress Notes (Signed)
BP 125/84   Pulse 77   Ht 5\' 10"  (1.778 m)   Wt 245 lb (111.1 kg)   SpO2 97%   BMI 35.15 kg/m    Subjective:   Patient ID: Carl [name], male    DOB: [birth year], 48 y.o.   MRN: [id number]  HPI: Carl [name] is a 48 y.o. male presenting on 04/08/2023 for Medical Management of Chronic Issues and Diabetes   HPI Type 2 diabetes mellitus Patient comes in today for recheck of his diabetes. Patient has been currently taking [place] and Mounjaro. Patient is currently on an ACE inhibitor/ARB. Patient has not seen an ophthalmologist this year. Patient denies any new issues with their feet. The symptom started onset as an adult hypertension and hyperlipidemia and CAD ARE RELATED TO DM   Hypertension Patient is currently on amlodipine and lisinopril and metoprolol, and their blood pressure today is 125/84. Patient denies any lightheadedness or dizziness. Patient denies headaches, blurred vision, chest pains, shortness of breath, or weakness. Denies any side effects from medication and is content with current medication.   Hyperlipidemia Patient is coming in for recheck of his hyperlipidemia. The patient is currently taking atorvastatin. They deny any issues with myalgias or history of liver damage from it. They deny any focal numbness or weakness or chest pain.   Relevant past medical, surgical, family and social history reviewed and updated as indicated. Interim medical history since our last visit reviewed. Allergies and medications reviewed and updated.  Review of Systems  Constitutional:  Negative for chills and fever.  Eyes:  Negative for visual disturbance.  Respiratory:  Negative for shortness of breath and wheezing.   Cardiovascular:  Negative for chest pain and leg swelling.  Musculoskeletal:  Negative for back pain and gait problem.  Skin:  Negative for rash.  All other systems reviewed and are negative.   Per HPI unless specifically indicated above   Allergies as of  04/08/2023   No Known Allergies      Medication List        Accurate as of April 08, 2023  8:47 AM. If you have any questions, ask your nurse or doctor.          amLODipine 2.5 MG tablet Commonly known as: NORVASC TAKE 1 TABLET BY MOUTH DAILY   aspirin EC 81 MG tablet Take 81 mg by mouth daily.   atorvastatin 80 MG tablet Commonly known as: LIPITOR Take 1 tablet (80 mg total) by mouth daily.   Jardiance 10 MG Tabs tablet Generic drug: empagliflozin TAKE 1 TABLET DAILY BEFORE BREAKFAST   lisinopril 5 MG tablet Commonly known as: ZESTRIL Take 1 tablet (5 mg total) by mouth daily.   metoprolol succinate 50 MG 24 hr tablet Commonly known as: TOPROL-XL TAKE 1 TABLET BY MOUTH EVERY DAY with OR immediately AFTER a meal   nitroGLYCERIN 0.4 MG SL tablet Commonly known as: NITROSTAT Place 1 tablet (0.4 mg total) under the tongue every 5 (five) minutes x 3 doses as needed for chest pain (if no relief after 2nd dose, proceed to ED or call 911).   omeprazole 40 MG capsule Commonly known as: PRILOSEC Take 1 capsule (40 mg total) by mouth daily.   tirzepatide 7.5 MG/0.5ML Pen Commonly known as: MOUNJARO Inject 7.5 mg into the skin once a week.   vitamin C 1000 MG tablet Take 2,000 mg by mouth daily.         Objective:   BP 125/84   Pulse  77   Ht 5\' 10"  (1.778 m)   Wt 245 lb (111.1 kg)   SpO2 97%   BMI 35.15 kg/m   Wt Readings from Last 3 Encounters:  04/08/23 245 lb (111.1 kg)  12/31/22 253 lb (114.8 kg)  12/03/22 250 lb (113.4 kg)    Physical Exam Vitals and nursing note reviewed.  Constitutional:      General: He is not in acute distress.    Appearance: He is well-developed. He is not diaphoretic.  Eyes:     General: No scleral icterus.       Right eye: No discharge.     Conjunctiva/sclera: Conjunctivae normal.     Pupils: Pupils are equal, round, and reactive to light.  Neck:     Thyroid: No thyromegaly.  Cardiovascular:     Rate and Rhythm:  Normal rate and regular rhythm.     Heart sounds: Normal heart sounds. No murmur heard. Pulmonary:     Effort: Pulmonary effort is normal. No respiratory distress.     Breath sounds: Normal breath sounds. No wheezing.  Musculoskeletal:        General: Normal range of motion.     Cervical back: Neck supple.  Lymphadenopathy:     Cervical: No cervical adenopathy.  Skin:    General: Skin is warm and dry.     Findings: No rash.  Neurological:     Mental Status: He is alert and oriented to person, place, and time.     Coordination: Coordination normal.  Psychiatric:        Behavior: Behavior normal.    Diabetic Foot Exam - Simple   Simple Foot Form Diabetic Foot exam was performed with the following findings: Yes 04/08/2023  8:46 AM  Visual Inspection No deformities, no ulcerations, no other skin breakdown bilaterally: Yes Sensation Testing Intact to touch and monofilament testing bilaterally: Yes Pulse Check Posterior Tibialis and Dorsalis pulse intact bilaterally: Yes Comments Tinea pedis between toes, recommended antifungal topical over-the-counter.       Assessment & Plan:   Problem List Items Addressed This Visit       Cardiovascular and Mediastinum   Hypertension associated with diabetes (HCC)   Relevant Medications   tirzepatide (MOUNJARO) 7.5 MG/0.5ML Pen   Other Relevant Orders   CBC with Differential/Platelet   CMP14+EGFR   Lipid panel     Endocrine   Hyperlipidemia associated with type 2 diabetes mellitus (HCC)   Relevant Medications   tirzepatide (MOUNJARO) 7.5 MG/0.5ML Pen   Other Relevant Orders   CBC with Differential/Platelet   CMP14+EGFR   Lipid panel   Type 2 diabetes mellitus with other specified complication (HCC) - Primary   Relevant Medications   tirzepatide (MOUNJARO) 7.5 MG/0.5ML Pen   Other Relevant Orders   Bayer DCA Hb A1c Waived   Microalbumin / creatinine urine ratio   CBC with Differential/Platelet   CMP14+EGFR   Lipid panel     A1c was good at 5.5.  He is continue to lose weight doing well.  No changes  Patient is seeing cardiology so he wanted full blood work before going to see them next month. Follow up plan: Return in about 3 months (around 07/06/2023), or if symptoms worsen or fail to improve, for dm and htn and hld.  Counseling provided for all of the vaccine components Orders Placed This Encounter  Procedures   Bayer DCA Hb A1c Waived   Microalbumin / creatinine urine ratio   CBC with Differential/Platelet   CMP14+EGFR  Lipid panel    Arville Care, MD [facility] 04/08/2023, 8:47 AM

## 2023-04-09 LAB — CBC WITH DIFFERENTIAL/PLATELET
Basophils Absolute: 0 10*3/uL (ref 0.0–0.2)
Basos: 1 %
EOS (ABSOLUTE): 0.2 10*3/uL (ref 0.0–0.4)
Eos: 2 %
Hematocrit: 48.3 % (ref 37.5–51.0)
Hemoglobin: 16.4 g/dL (ref 13.0–17.7)
Immature Grans (Abs): 0 10*3/uL (ref 0.0–0.1)
Immature Granulocytes: 0 %
Lymphocytes Absolute: 3.1 10*3/uL (ref 0.7–3.1)
Lymphs: 36 %
MCH: 32.3 pg (ref 26.6–33.0)
MCHC: 34 g/dL (ref 31.5–35.7)
MCV: 95 fL (ref 79–97)
Monocytes Absolute: 0.8 10*3/uL (ref 0.1–0.9)
Monocytes: 10 %
Neutrophils Absolute: 4.4 10*3/uL (ref 1.4–7.0)
Neutrophils: 51 %
Platelets: 201 10*3/uL (ref 150–450)
RBC: 5.08 x10E6/uL (ref 4.14–5.80)
RDW: 12.3 % (ref 11.6–15.4)
WBC: 8.6 10*3/uL (ref 3.4–10.8)

## 2023-04-09 LAB — CMP14+EGFR
ALT: 19 IU/L (ref 0–44)
AST: 20 IU/L (ref 0–40)
Albumin: 4.7 g/dL (ref 4.1–5.1)
Alkaline Phosphatase: 80 IU/L (ref 44–121)
BUN/Creatinine Ratio: 11 (ref 9–20)
BUN: 11 mg/dL (ref 6–24)
Bilirubin Total: 1 mg/dL (ref 0.0–1.2)
CO2: 24 mmol/L (ref 20–29)
Calcium: 10 mg/dL (ref 8.7–10.2)
Chloride: 102 mmol/L (ref 96–106)
Creatinine, Ser: 1.03 mg/dL (ref 0.76–1.27)
Globulin, Total: 2.7 g/dL (ref 1.5–4.5)
Glucose: 89 mg/dL (ref 70–99)
Potassium: 5.1 mmol/L (ref 3.5–5.2)
Sodium: 141 mmol/L (ref 134–144)
Total Protein: 7.4 g/dL (ref 6.0–8.5)
eGFR: 89 mL/min/{1.73_m2} (ref >59)

## 2023-04-09 LAB — LIPID PANEL
Cholesterol, Total: 100 mg/dL (ref 100–199)
HDL: 31 mg/dL — ABNORMAL LOW (ref >39)
LDL CALC COMMENT:: 3.2 ratio (ref 0.0–5.0)
LDL Chol Calc (NIH): 48 mg/dL (ref 0–99)
Triglycerides: 113 mg/dL (ref 0–149)
VLDL Cholesterol Cal: 21 mg/dL (ref 5–40)

## 2023-04-10 LAB — MICROALBUMIN / CREATININE URINE RATIO
Creatinine, Urine: 39.6 mg/dL
Microalb/Creat Ratio: 31 mg/g{creat} — ABNORMAL HIGH (ref 0–29)
Microalbumin, Urine: 12.3 ug/mL

## 2023-04-22 ENCOUNTER — Encounter: Payer: Self-pay | Admitting: Cardiology

## 2023-04-22 ENCOUNTER — Encounter: Payer: Self-pay | Admitting: *Deleted

## 2023-04-22 ENCOUNTER — Ambulatory Visit: Payer: BC Managed Care – PPO | Attending: Cardiology | Admitting: Cardiology

## 2023-04-22 VITALS — BP 106/58 | HR 94 | Ht 71.0 in | Wt 249.4 lb

## 2023-04-22 DIAGNOSIS — E782 Mixed hyperlipidemia: Secondary | ICD-10-CM

## 2023-04-22 DIAGNOSIS — I10 Essential (primary) hypertension: Secondary | ICD-10-CM

## 2023-04-22 DIAGNOSIS — I25118 Atherosclerotic heart disease of native coronary artery with other forms of angina pectoris: Secondary | ICD-10-CM | POA: Diagnosis not present

## 2023-04-22 NOTE — Patient Instructions (Signed)
 Medication Instructions:   Continue all current medications.   Labwork:  none  Testing/Procedures:  Your physician has requested that you have an exercise stress myoview. For further information please visit [email or web address] Please follow instruction sheet, as given.  Office will contact with results via phone, letter or mychart.     Follow-Up:  6 months   Any Other Special Instructions Will Be Listed Below (If Applicable).   If you need a refill on your cardiac medications before your next appointment, please call your pharmacy.

## 2023-04-22 NOTE — Progress Notes (Signed)
 Clinical Summary Carl [name] is a 48 y.o.male seen today for follow up of the following medical problem.s    1. CAD with chronic stable angina - abnormal exericse stress in 2015. - cath 2015 at [facility] with occluded proximal LAD, LAD filled with left-left and right-left collaterals. Medically managed  - echo Jan 2018 LVEF 55-60%     - nuclear stress Jan 2018 showed apical and distal anterolateral infarct with ischemia. Overall intermediate to high risk.      06/2017 cath with prox LAD 100%, RCA prox 50%, RPDA 40%, norma LCX - he was referred for evaluation for CTO intervention on his LAD - 08/2017 succesful CTO PCI of LAD with DES x 2.    Imdur stopped that admission due to headaches.    07/2021 nuclear stress: inferior st depressions, ischemia distal anterior and apical walls. Rated as low to intermediate risk. SDS 4.    - occasional slight discomfort, midchest/epigastric. Not specifically exertional, last about 1-2 minutes. No other associated symptoms. Occurs at most twice a month.  - compliant with meds   2. Hyperlipidemia  12/2021 TC 103 TG 222 HDL 23 LDL 44 - 06/2022 TC 132 TG 440 HDL 30 LDL 53 - 03/2023 TC 102 TG 725 HDL 31 LDL 48   3. HTN - he is compliant with meds   4. DM2 - recent A1c was 5.5 - glycemic control per pcp   SH: works at [employer]  Past Medical History:  Diagnosis Date   Anxiety    CAD in native artery    a. cath in 2015 [facility] showing occluded proximal LAD, LAD filled with left-left and right-left collaterals, otherwise 20-30% prox-mid RCA, LVEF 55%. b. Abnormal nuc 02/2016 - [facility] medically. c. redo cath in 06/2017 showing 100% Prox LAD stenosis, 50% RCA, and 40% RPDA and underwent CTO PCI of the LAD with DESx2 to in 08/2017   Daily headache    "since I started Plavix" (08/25/2017)   Depression    Diabetes mellitus type 2 in obese    Hyperlipidemia    Hypertension    OSA on CPAP      No Known Allergies   Current Outpatient Medications   Medication Sig Dispense Refill   amLODipine (NORVASC) 2.5 MG tablet TAKE 1 TABLET BY MOUTH DAILY 90 tablet 1   Ascorbic Acid (VITAMIN C) 1000 MG tablet Take 2,000 mg by mouth daily.     aspirin EC 81 MG tablet Take 81 mg by mouth daily.      atorvastatin (LIPITOR) 80 MG tablet Take 1 tablet (80 mg total) by mouth daily. 90 tablet 3   empagliflozin (JARDIANCE) 10 MG TABS tablet TAKE 1 TABLET DAILY BEFORE BREAKFAST 90 tablet 2   lisinopril (ZESTRIL) 5 MG tablet Take 1 tablet (5 mg total) by mouth daily. 90 tablet 3   metoprolol succinate (TOPROL-XL) 50 MG 24 hr tablet TAKE 1 TABLET BY MOUTH EVERY DAY with OR immediately AFTER a meal 90 tablet 3   nitroGLYCERIN (NITROSTAT) 0.4 MG SL tablet Place 1 tablet (0.4 mg total) under the tongue every 5 (five) minutes x 3 doses as needed for chest pain (if no relief after 2nd dose, proceed to ED or call 911). 25 tablet 3   omeprazole (PRILOSEC) 40 MG capsule Take 1 capsule (40 mg total) by mouth daily. 90 capsule 3   tirzepatide (MOUNJARO) 7.5 MG/0.5ML Pen Inject 7.5 mg into the skin once a week. 6 mL 3  No current facility-administered medications for this visit.     Past Surgical History:  Procedure Laterality Date   CORONARY CTO INTERVENTION N/A 08/25/2017   Procedure: CORONARY CTO INTERVENTION;  Surgeon: Swaziland, Peter M, MD;  Location: [facility] INVASIVE CV LAB;  Service: Cardiovascular;  Laterality: N/A;   LEFT HEART CATH AND CORONARY ANGIOGRAPHY N/A 07/21/2017   Procedure: LEFT HEART CATH AND CORONARY ANGIOGRAPHY;  Surgeon: Lyn Records, MD;  Location: [facility] INVASIVE CV LAB;  Service: Cardiovascular;  Laterality: N/A;   MOLE REMOVAL Right    "chin"   TUMOR EXCISION Left 1991   knee area     No Known Allergies    Family History  Problem Relation Age of Onset   Diabetes Mother    Cancer Mother    Diabetes Father      Social History Mr. Lennartz reports that he has been smoking cigarettes. He started smoking about 30 years ago. He has a 30.3  pack-year smoking history. He has never used smokeless tobacco. Mr. Daffin reports current alcohol use.     Physical Examination Today's Vitals   04/22/23 1132  BP: (!) 106/58  Pulse: 94  SpO2: 95%  Weight: 249 lb 6.4 oz (113.1 kg)  Height: 5\' 11"  (1.803 m)   Body mass index is 34.78 kg/m.  Gen: resting comfortably, no acute distress HEENT: no scleral icterus, pupils equal round and reactive, no palptable cervical adenopathy,  CV: RRR, no mrg no jvd Resp: Clear to auscultation bilaterally GI: abdomen is soft, non-tender, non-distended, normal bowel sounds, no hepatosplenomegaly MSK: extremities are warm, no edema.  Skin: warm, no rash Neuro:  no focal deficits Psych: appropriate affect   Diagnostic Studies 05/2020 GXT Patient achieved a maximum workload of 7 METS, MPHR 86%. There were equivocal ST segment changes, 0.5 mm ST segment depression in the inferior leads. Nonlimiting, vague chest discomfort reported. Hypertensive response noted to exercise. There were no arrhythmias. Intermediate Duke treadmill score of -1. Blood pressure demonstrated a hypertensive response to exercise.     07/2021 nuclear stress Exercise sterss test:  Clinically negative, electrically positive with ST depression in inferior leads that improved in the recovery period   Myoview scan with ischemia in the distal anterior and apical walls   LVEF on gating calculated at 51% wiht mild hypokinesis of the distal anterior wall   Low to intermediate risk scan.  Compared to myoview scan done in 2018, defect in similar distribution but less severe in current study.    Assessment and Plan   1. CAD with chronic stable angina - infrequent mild chest pain at times, recent stress test mild ischemia.  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 Hold metoprolol day of test.    2. Hyperlipidema -at  goal, continue current meds   3. HTN -well controlled, continue current meds  F/u 6 months     Antoine Poche, M.D.

## 2023-04-29 ENCOUNTER — Ambulatory Visit (HOSPITAL_BASED_OUTPATIENT_CLINIC_OR_DEPARTMENT_OTHER)
Admission: RE | Admit: 2023-04-29 | Discharge: 2023-04-29 | Disposition: A | Discharge status: Home / Self Care | Payer: BC Managed Care – PPO | Source: Ambulatory Visit | Attending: Cardiology | Admitting: Cardiology

## 2023-04-29 ENCOUNTER — Ambulatory Visit: Payer: BC Managed Care – PPO | Admitting: Cardiology

## 2023-04-29 ENCOUNTER — Ambulatory Visit (HOSPITAL_COMMUNITY)
Admission: RE | Admit: 2023-04-29 | Discharge: 2023-04-29 | Disposition: A | Discharge status: Home / Self Care | Payer: BC Managed Care – PPO | Source: Ambulatory Visit | Attending: Cardiology | Admitting: Cardiology

## 2023-04-29 DIAGNOSIS — I25118 Atherosclerotic heart disease of native coronary artery with other forms of angina pectoris: Secondary | ICD-10-CM

## 2023-04-29 LAB — NM MYOCAR MULTI W/SPECT W/WALL MOTION / EF
Angina Index: 0
Estimated workload: 9.7
Exercise duration (min): 6 min
Exercise duration (sec): 54 s
LV dias vol: 122 mL (ref 62–150)
LV sys vol: 60 mL
MPHR: 171 {beats}/min
Nuc Stress EF: 51 %
Peak HR: 146 {beats}/min
Percent HR: 85 %
RATE: 0.4
RPE: 13
Rest HR: 90 {beats}/min
Rest Nuclear Isotope Dose: 9 mCi
SDS: 3
SRS: 10
SSS: 13
Stress Nuclear Isotope Dose: 30 mCi
TID: 1.32

## 2023-04-29 MED ORDER — REGADENOSON 0.4 MG/5ML IV SOLN
INTRAVENOUS | Status: AC
  Filled 2023-04-29: qty 5

## 2023-04-29 MED ORDER — SODIUM CHLORIDE FLUSH 0.9 % IV SOLN
INTRAVENOUS | Status: AC
  Administered 2023-04-29: 10 mL via INTRAVENOUS
  Filled 2023-04-29: qty 10

## 2023-04-29 MED ORDER — TECHNETIUM TC 99M TETROFOSMIN IV KIT
30.0000 | PACK | Freq: Once | INTRAVENOUS | Status: AC | PRN
  Administered 2023-04-29: 30 via INTRAVENOUS

## 2023-04-29 MED ORDER — TECHNETIUM TC 99M TETROFOSMIN IV KIT
10.0000 | PACK | Freq: Once | INTRAVENOUS | Status: AC | PRN
  Administered 2023-04-29: 9.04 via INTRAVENOUS

## 2023-05-11 ENCOUNTER — Telehealth: Payer: Self-pay | Admitting: Cardiology

## 2023-05-11 NOTE — Telephone Encounter (Signed)
 Patient would like to know if he could stop by the office to pick up a copy of 3/06 stress test results.

## 2023-05-11 NOTE — Telephone Encounter (Deleted)
 Please advise results as pt would like a copy.

## 2023-05-19 ENCOUNTER — Telehealth: Payer: Self-pay | Admitting: *Deleted

## 2023-05-19 NOTE — Telephone Encounter (Signed)
 The patient verbally consented for a telehealth phone visit with [facility] and understands that his/her insurance company will be billed for the encounter.

## 2023-05-21 ENCOUNTER — Ambulatory Visit: Attending: Cardiology | Admitting: Cardiology

## 2023-05-21 ENCOUNTER — Encounter: Payer: Self-pay | Admitting: Cardiology

## 2023-05-21 VITALS — Ht 71.0 in | Wt 249.0 lb

## 2023-05-21 DIAGNOSIS — R9439 Abnormal result of other cardiovascular function study: Secondary | ICD-10-CM

## 2023-05-21 DIAGNOSIS — I25118 Atherosclerotic heart disease of native coronary artery with other forms of angina pectoris: Secondary | ICD-10-CM | POA: Diagnosis not present

## 2023-05-21 NOTE — H&P (View-Only) (Signed)
 Virtual Visit via Telephone Note   Because of Carl [name]'s co-morbid illnesses, he is at least at moderate risk for complications without adequate follow up.  This format is felt to be most appropriate for this patient at this time.  The patient did not have access to video technology/had technical difficulties with video requiring transitioning to audio format only (telephone).  All issues noted in this document were discussed and addressed.  No physical exam could be performed with this format.  Please refer to the patient's chart for his consent to telehealth for [facility].    Date:  05/21/2023   ID:  [name], DOB 12/14/1975, MRN [id number] The patient was identified using 2 identifiers.  Patient Location: Home Provider Location: Office/Clinic   PCP:  Dettinger, Elige Radon, MD    [facility] Prov[facility] Cardiologist:  Dina Rich, MD     Evaluation Performed:  Follow-Up Visit  Chief Complaint:  Follow up visit  History of Present Illness:    [name] is a 48 y.o. male seen today for follow up of the following medical problems.  This is a focused visit to discuss abnormal stress test findings.    1. CAD with chronic stable angina - abnormal exericse stress in 2015. - cath 2015 at [facility] with occluded proximal LAD, LAD filled with left-left and right-left collaterals. Medically managed  - echo Jan 2018 LVEF 55-60%  - nuclear stress Jan 2018 showed apical and distal anterolateral infarct with ischemia. Overall intermediate to high risk.     06/2017 cath with prox LAD 100%, RCA prox 50%, RPDA 40%, norma LCX - he was referred for evaluation for CTO intervention on his LAD - 08/2017 succesful CTO PCI of LAD with DES x 2.   Imdur stopped that admission due to headaches.    -07/2021 nuclear stress: inferior st depressions, ischemia distal anterior and apical walls. Rated as low to intermediate risk. SDS 4.   - occasional slight discomfort,  midchest/epigastric. Not specifically exertional, last about 1-2 minutes. No other associated symptoms. Occurs at most twice a month.  - compliant with meds  -04/2023 nuclear stress: exercised 6 minutes and 54 seconds, no specific ischemic changes  limited by artifact, <64mm ST depression inferior leads. Treadmill score reported at 4, assuming subtracted for 0.75mm ST depression in the equation. Small reversible defect mid inferior wall, TID 1.32. SDS 3.  - on my review 1mm ST depressio inferior and lateral precordial lead sin recovery, would give Duke treadmill score of 2 which is intermediate - denies any specific changes in  chest pains since last visit     Other medical problems not addressed this visit   2. Hyperlipidemia  12/2021 TC 103 TG 222 HDL 23 LDL 44 - 06/2022 TC 528 TG 413 HDL 30 LDL 53 - 03/2023 TC 244 TG 010 HDL 31 LDL 48   3. HTN - he is compliant with meds   4. DM2 - recent A1c was 5.5 - glycemic control per pcp   SH: works at [employer]    Past Medical History:  Diagnosis Date   Anxiety    CAD in native artery    a. cath in 2015 [facility] showing occluded proximal LAD, LAD filled with left-left and right-left collaterals, otherwise 20-30% prox-mid RCA, LVEF 55%. b. Abnormal nuc 02/2016 - [facility] medically. c. redo cath in 06/2017 showing 100% Prox LAD stenosis, 50% RCA, and 40% RPDA and underwent CTO PCI of the LAD with  DESx2 to in 08/2017   Daily headache    "since I started Plavix" (08/25/2017)   Depression    Diabetes mellitus type 2 in obese    Hyperlipidemia    Hypertension    OSA on CPAP    Past Surgical History:  Procedure Laterality Date   CORONARY CTO INTERVENTION N/A 08/25/2017   Procedure: CORONARY CTO INTERVENTION;  Surgeon: Swaziland, Peter M, MD;  Location: [facility] INVASIVE CV LAB;  Service: Cardiovascular;  Laterality: N/A;   LEFT HEART CATH AND CORONARY ANGIOGRAPHY N/A 07/21/2017   Procedure: LEFT HEART CATH AND CORONARY ANGIOGRAPHY;  Surgeon: Lyn Records,  MD;  Location: [facility] INVASIVE CV LAB;  Service: Cardiovascular;  Laterality: N/A;   MOLE REMOVAL Right    "chin"   TUMOR EXCISION Left 1991   knee area     No outpatient medications have been marked as taking for the 05/21/23 encounter (Appointment) with Antoine Poche, MD.     Allergies:   Patient has no known allergies.   Social History   Tobacco Use   Smoking status: Every Day    Current packs/day: 1.00    Average packs/day: 1 pack/day for 30.4 years (30.4 ttl pk-yrs)    Types: Cigarettes    Start date: 01/03/1993   Smokeless tobacco: Never  Vaping Use   Vaping status: Every Day   Substances: Nicotine, Flavoring  Substance Use Topics   Alcohol use: Yes    Comment: 08/25/2017 "0-a few drinks/year"   Drug use: Never     Family Hx: The patient's family history includes Cancer in his mother; Diabetes in his father and mother.  ROS:   Please see the history of present illness.     All other systems reviewed and are negative.   Prior CV studies:   The following studies were reviewed today:  05/2020 GXT Patient achieved a maximum workload of 7 METS, MPHR 86%. There were equivocal ST segment changes, 0.5 mm ST segment depression in the inferior leads. Nonlimiting, vague chest discomfort reported. Hypertensive response noted to exercise. There were no arrhythmias. Intermediate Duke treadmill score of -1. Blood pressure demonstrated a hypertensive response to exercise.     07/2021 nuclear stress Exercise sterss test:  Clinically negative, electrically positive with ST depression in inferior leads that improved in the recovery period   Myoview scan with ischemia in the distal anterior and apical walls   LVEF on gating calculated at 51% wiht mild hypokinesis of the distal anterior wall   Low to intermediate risk scan.  Compared to myoview scan done in 2018, defect in similar distribution but less severe in current study.    04/2023 nuclear stress Exercise sterss test:   Clinically negative, electrically positive with ST depression in inferior leads that improved in the recovery period   Myoview scan with ischemia in the distal anterior and apical walls   LVEF on gating calculated at 51% wiht mild hypokinesis of the distal anterior wall   Low to intermediate risk scan.  Compared to myoview scan done in 2018, defect in similar distribution but less severe in current study.     Labs/Other Tests and Data Reviewed:    EKG:    Recent Labs: 04/08/2023: ALT 19; BUN 11; Creatinine, Ser 1.03; Hemoglobin 16.4; Platelets 201; Potassium 5.1; Sodium 141   Recent Lipid Panel Lab Results  Component Value Date/Time   CHOL 100 04/08/2023 08:55 AM   TRIG 113 04/08/2023 08:55 AM   HDL 31 (L) 04/08/2023 08:55 AM  CHOLHDL 3.2 04/08/2023 08:55 AM   LDLCALC 48 04/08/2023 08:55 AM    Wt Readings from Last 3 Encounters:  04/22/23 249 lb 6.4 oz (113.1 kg)  04/08/23 245 lb (111.1 kg)  12/31/22 253 lb (114.8 kg)         Objective:    Vital Signs:   Today's Vitals   05/21/23 0934  Weight: 249 lb (112.9 kg)  Height: 5\' 11"  (1.803 m)   Body mass index is 34.73 kg/m. Normal affect. Normal speech pattern and tone. Comfortable, no apparent distress. No audible signs of sob or wheezing.   ASSESSMENT & PLAN:    1. CAD with chronic stable angina - infrequent mild chest pain at times, has had chronic stable mild angina.  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 Moderate risk based on EKG alone, he had similar EKG changes in 2023. Report suggests inferior ischemia which would be new, on the limited images I can see unclear to me, I will review images in detail Monday from reading station. From limited images remotely reviewed looks to have prior apical/distal anterior changes similar to prior studies. He does have elevated TID which may suggest a component of balanced ischemia and higher risk - review images from reading  station on Monday, will contact patient and discuss options. He is open to cath if indicated.     05/24/23 addendum Nuclear stress images reviewed in full. Moderate size moderate intensity distal anterior/apical defect that is fixed. Small mild intensity inferior defect that is reversible. Elevated TID of 1.32 by measurements and visually, which may suggest a component of balance ischemia and higher cardiac risk. 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 He has had chronic fairly stable mild chest pains. Plan for cath to further evaluate his anatomy.   Informed Consent   Shared Decision Making/Informed Consent The risks [stroke (1 in 1000), death (1 in 1000), kidney failure [usually temporary] (1 in 500), bleeding (1 in 200), allergic reaction [possibly serious] (1 in 200)], benefits (diagnostic support and management of coronary artery disease) and alternatives of a cardiac catheterization were discussed in detail with Mr. Vandevoorde and he is willing to proceed.        Time:   Today, I have spent 20 minutes with the patient with telehealth technology discussing the above problems.     Medication Adjustments/Labs and Tests Ordered: Current medicines are reviewed at length with the patient today.  Concerns regarding medicines are outlined above.   Tests Ordered: No orders of the defined types were placed in this encounter.   Medication Changes: No orders of the defined types were placed in this encounter.   Follow Up:  Will call patient Monday to discuss stress findings after further review  Signed, Dina Rich, MD  05/21/2023 9:31 AM    [facility]

## 2023-05-21 NOTE — Progress Notes (Addendum)
 Virtual Visit via Telephone Note   Because of Carl [name]'s co-morbid illnesses, he is at least at moderate risk for complications without adequate follow up.  This format is felt to be most appropriate for this patient at this time.  The patient did not have access to video technology/had technical difficulties with video requiring transitioning to audio format only (telephone).  All issues noted in this document were discussed and addressed.  No physical exam could be performed with this format.  Please refer to the patient's chart for his consent to telehealth for [facility].    Date:  05/21/2023   ID:  Carl [name], DOB 12/14/1975, MRN [id number] The patient was identified using 2 identifiers.  Patient Location: Home Provider Location: Office/Clinic   PCP:  Dettinger, Elige Radon, MD    [facility] Providers Cardiologist:  Dina Rich, MD     Evaluation Performed:  Follow-Up Visit  Chief Complaint:  Follow up visit  History of Present Illness:    [name] is a 48 y.o. male seen today for follow up of the following medical problems.  This is a focused visit to discuss abnormal stress test findings.    1. CAD with chronic stable angina - abnormal exericse stress in 2015. - cath 2015 at [facility] with occluded proximal LAD, LAD filled with left-left and right-left collaterals. Medically managed  - echo Jan 2018 LVEF 55-60%  - nuclear stress Jan 2018 showed apical and distal anterolateral infarct with ischemia. Overall intermediate to high risk.     06/2017 cath with prox LAD 100%, RCA prox 50%, RPDA 40%, norma LCX - he was referred for evaluation for CTO intervention on his LAD - 08/2017 succesful CTO PCI of LAD with DES x 2.   Imdur stopped that admission due to headaches.    -07/2021 nuclear stress: inferior st depressions, ischemia distal anterior and apical walls. Rated as low to intermediate risk. SDS 4.   - occasional slight discomfort,  midchest/epigastric. Not specifically exertional, last about 1-2 minutes. No other associated symptoms. Occurs at most twice a month.  - compliant with meds  -04/2023 nuclear stress: exercised 6 minutes and 54 seconds, no specific ischemic changes  limited by artifact, <64mm ST depression inferior leads. Treadmill score reported at 4, assuming subtracted for 0.75mm ST depression in the equation. Small reversible defect mid inferior wall, TID 1.32. SDS 3.  - on my review 1mm ST depressio inferior and lateral precordial lead sin recovery, would give Duke treadmill score of 2 which is intermediate - denies any specific changes in  chest pains since last visit     Other medical problems not addressed this visit   2. Hyperlipidemia  12/2021 TC 103 TG 222 HDL 23 LDL 44 - 06/2022 TC 528 TG 413 HDL 30 LDL 53 - 03/2023 TC 244 TG 010 HDL 31 LDL 48   3. HTN - he is compliant with meds   4. DM2 - recent A1c was 5.5 - glycemic control per pcp   SH: works at [employer]    Past Medical History:  Diagnosis Date   Anxiety    CAD in native artery    a. cath in 2015 [facility] showing occluded proximal LAD, LAD filled with left-left and right-left collaterals, otherwise 20-30% prox-mid RCA, LVEF 55%. b. Abnormal nuc 02/2016 - [facility] medically. c. redo cath in 06/2017 showing 100% Prox LAD stenosis, 50% RCA, and 40% RPDA and underwent CTO PCI of the LAD with  DESx2 to in 08/2017   Daily headache    "since I started Plavix" (08/25/2017)   Depression    Diabetes mellitus type 2 in obese    Hyperlipidemia    Hypertension    OSA on CPAP    Past Surgical History:  Procedure Laterality Date   CORONARY CTO INTERVENTION N/A 08/25/2017   Procedure: CORONARY CTO INTERVENTION;  Surgeon: Swaziland, Peter M, MD;  Location: [facility] INVASIVE CV LAB;  Service: Cardiovascular;  Laterality: N/A;   LEFT HEART CATH AND CORONARY ANGIOGRAPHY N/A 07/21/2017   Procedure: LEFT HEART CATH AND CORONARY ANGIOGRAPHY;  Surgeon: Lyn Records,  MD;  Location: [facility] INVASIVE CV LAB;  Service: Cardiovascular;  Laterality: N/A;   MOLE REMOVAL Right    "chin"   TUMOR EXCISION Left 1991   knee area     No outpatient medications have been marked as taking for the 05/21/23 encounter (Appointment) with Antoine Poche, MD.     Allergies:   Patient has no known allergies.   Social History   Tobacco Use   Smoking status: Every Day    Current packs/day: 1.00    Average packs/day: 1 pack/day for 30.4 years (30.4 ttl pk-yrs)    Types: Cigarettes    Start date: 01/03/1993   Smokeless tobacco: Never  Vaping Use   Vaping status: Every Day   Substances: Nicotine, Flavoring  Substance Use Topics   Alcohol use: Yes    Comment: 08/25/2017 "0-a few drinks/year"   Drug use: Never     Family Hx: The patient's family history includes Cancer in his mother; Diabetes in his father and mother.  ROS:   Please see the history of present illness.     All other systems reviewed and are negative.   Prior CV studies:   The following studies were reviewed today:  05/2020 [facility] Patient achieved a maximum workload of 7 METS, MPHR 86%. There were equivocal ST segment changes, 0.5 mm ST segment depression in the inferior leads. Nonlimiting, vague chest discomfort reported. Hypertensive response noted to exercise. There were no arrhythmias. Intermediate Duke treadmill score of -1. Blood pressure demonstrated a hypertensive response to exercise.     07/2021 nuclear stress Exercise sterss test:  Clinically negative, electrically positive with ST depression in inferior leads that improved in the recovery period   Myoview scan with ischemia in the distal anterior and apical walls   LVEF on gating calculated at 51% wiht mild hypokinesis of the distal anterior wall   Low to intermediate risk scan.  Compared to myoview scan done in 2018, defect in similar distribution but less severe in current study.    04/2023 nuclear stress Exercise sterss test:   Clinically negative, electrically positive with ST depression in inferior leads that improved in the recovery period   Myoview scan with ischemia in the distal anterior and apical walls   LVEF on gating calculated at 51% wiht mild hypokinesis of the distal anterior wall   Low to intermediate risk scan.  Compared to myoview scan done in 2018, defect in similar distribution but less severe in current study.     Labs/Other Tests and Data Reviewed:    EKG:    Recent Labs: 04/08/2023: ALT 19; BUN 11; Creatinine, Ser 1.03; Hemoglobin 16.4; Platelets 201; Potassium 5.1; Sodium 141   Recent Lipid Panel Lab Results  Component Value Date/Time   CHOL 100 04/08/2023 08:55 AM   TRIG 113 04/08/2023 08:55 AM   HDL 31 (L) 04/08/2023 08:55 AM  CHOLHDL 3.2 04/08/2023 08:55 AM   LDLCALC 48 04/08/2023 08:55 AM    Wt Readings from Last 3 Encounters:  04/22/23 249 lb 6.4 oz (113.1 kg)  04/08/23 245 lb (111.1 kg)  12/31/22 253 lb (114.8 kg)         Objective:    Vital Signs:   Today's Vitals   05/21/23 0934  Weight: 249 lb (112.9 kg)  Height: 5\' 11"  (1.803 m)   Body mass index is 34.73 kg/m. Normal affect. Normal speech pattern and tone. Comfortable, no apparent distress. No audible signs of sob or wheezing.   ASSESSMENT & PLAN:    1. CAD with chronic stable angina - infrequent mild chest pain at times, has had chronic stable mild angina.  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 Moderate risk based on EKG alone, he had similar EKG changes in 2023. Report suggests inferior ischemia which would be new, on the limited images I can see unclear to me, I will review images in detail Monday from reading station. From limited images remotely reviewed looks to have prior apical/distal anterior changes similar to prior studies. He does have elevated TID which may suggest a component of balanced ischemia and higher risk - review images from reading  station on Monday, will contact patient and discuss options. He is open to cath if indicated.     05/24/23 addendum Nuclear stress images reviewed in full. Moderate size moderate intensity distal anterior/apical defect that is fixed. Small mild intensity inferior defect that is reversible. Elevated TID of 1.32 by measurements and visually, which may suggest a component of balance ischemia and higher cardiac risk. 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 He has had chronic fairly stable mild chest pains. Plan for cath to further evaluate his anatomy.   Informed Consent   Shared Decision Making/Informed Consent The risks [stroke (1 in 1000), death (1 in 1000), kidney failure [usually temporary] (1 in 500), bleeding (1 in 200), allergic reaction [possibly serious] (1 in 200)], benefits (diagnostic support and management of coronary artery disease) and alternatives of a cardiac catheterization were discussed in detail with Mr. Vandevoorde and he is willing to proceed.        Time:   Today, I have spent 20 minutes with the patient with telehealth technology discussing the above problems.     Medication Adjustments/Labs and Tests Ordered: Current medicines are reviewed at length with the patient today.  Concerns regarding medicines are outlined above.   Tests Ordered: No orders of the defined types were placed in this encounter.   Medication Changes: No orders of the defined types were placed in this encounter.   Follow Up:  Will call patient Monday to discuss stress findings after further review  Signed, Dina Rich, MD  05/21/2023 9:31 AM    [facility]

## 2023-05-21 NOTE — Patient Instructions (Signed)
 Medication Instructions:  Continue all current medications.  Labwork: none  Testing/Procedures: none  Follow-Up: Pending   Any Other Special Instructions Will Be Listed Below (If Applicable).  If you need a refill on your cardiac medications before your next appointment, please call your pharmacy.

## 2023-05-28 ENCOUNTER — Other Ambulatory Visit: Payer: Self-pay | Admitting: Cardiology

## 2023-05-28 ENCOUNTER — Ambulatory Visit

## 2023-05-28 ENCOUNTER — Telehealth: Payer: Self-pay | Admitting: Cardiology

## 2023-05-28 ENCOUNTER — Other Ambulatory Visit: Payer: Self-pay

## 2023-05-28 DIAGNOSIS — Z01818 Encounter for other preprocedural examination: Secondary | ICD-10-CM

## 2023-05-28 DIAGNOSIS — I25118 Atherosclerotic heart disease of native coronary artery with other forms of angina pectoris: Secondary | ICD-10-CM

## 2023-05-28 NOTE — Telephone Encounter (Signed)
 Patient notified- waiting on response from provider.

## 2023-05-28 NOTE — Telephone Encounter (Deleted)
 Per office note from provider:  05/24/23 addendum Nuclear stress images reviewed in full. Moderate size moderate intensity distal anterior/apical defect that is fixed. Small mild intensity inferior defect that is reversible. Elevated TID of 1.32 by measurements and visually, which may suggest a component of balance ischemia and higher cardiac risk. 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 He has had chronic fairly stable mild chest pains. Plan for cath to further evaluate his anatomy.   Please advise if cath is still warranted.

## 2023-05-28 NOTE — Telephone Encounter (Signed)
 Pt called in on the status on getting Cath scheduled. Please advise.

## 2023-05-28 NOTE — Telephone Encounter (Signed)
 Cath scheduled 4/9 at 8:30 with Dr. Jacinto Halim.

## 2023-05-28 NOTE — Telephone Encounter (Signed)
 Patient on Jardiance & Mounjaro.  Also, needs EKG.

## 2023-05-28 NOTE — Telephone Encounter (Signed)
 Left heart cath for chest pain. No grafts & NKDA.

## 2023-05-28 NOTE — Telephone Encounter (Signed)
 Message sent to provider

## 2023-05-31 ENCOUNTER — Other Ambulatory Visit (HOSPITAL_COMMUNITY)
Admission: RE | Admit: 2023-05-31 | Discharge: 2023-05-31 | Disposition: A | Discharge status: Home / Self Care | Source: Ambulatory Visit | Attending: Cardiology | Admitting: Cardiology

## 2023-05-31 ENCOUNTER — Ambulatory Visit (HOSPITAL_COMMUNITY)
Admission: RE | Admit: 2023-05-31 | Discharge: 2023-05-31 | Disposition: A | Discharge status: Home / Self Care | Source: Ambulatory Visit | Attending: Cardiology | Admitting: Cardiology

## 2023-05-31 ENCOUNTER — Ambulatory Visit

## 2023-05-31 ENCOUNTER — Other Ambulatory Visit: Payer: Self-pay

## 2023-05-31 DIAGNOSIS — Z01818 Encounter for other preprocedural examination: Secondary | ICD-10-CM | POA: Insufficient documentation

## 2023-05-31 LAB — CBC
HCT: 42.2 % (ref 39.0–52.0)
Hemoglobin: 14.8 g/dL (ref 13.0–17.0)
MCH: 32.6 pg (ref 26.0–34.0)
MCHC: 35.1 g/dL (ref 30.0–36.0)
MCV: 93 fL (ref 80.0–100.0)
Platelets: 166 10*3/uL (ref 150–400)
RBC: 4.54 MIL/uL (ref 4.22–5.81)
RDW: 12.3 % (ref 11.5–15.5)
WBC: 7.7 10*3/uL (ref 4.0–10.5)
nRBC: 0 % (ref 0.0–0.2)

## 2023-05-31 LAB — BASIC METABOLIC PANEL WITH GFR
Anion gap: 10 (ref 5–15)
BUN: 17 mg/dL (ref 6–20)
CO2: 23 mmol/L (ref 22–32)
Calcium: 8.9 mg/dL (ref 8.9–10.3)
Chloride: 104 mmol/L (ref 98–111)
Creatinine, Ser: 0.98 mg/dL (ref 0.61–1.24)
GFR, Estimated: >60 mL/min (ref >60)
Glucose, Bld: 139 mg/dL — ABNORMAL HIGH (ref 70–99)
Potassium: 3.7 mmol/L (ref 3.5–5.1)
Sodium: 137 mmol/L (ref 135–145)

## 2023-06-01 ENCOUNTER — Telehealth: Payer: Self-pay | Admitting: *Deleted

## 2023-06-01 NOTE — Telephone Encounter (Signed)
 Cardiac Catheterization scheduled at [facility] for: Wednesday June 02, 2023 8:30 AM Arrival time [facility] Main Entrance A at: 6:30 AM  Nothing to eat after midnight prior to procedure, clear liquids until 5 AM day of procedure.  Medication instructions: -Hold:  Jardiance PM prior to procedure  Mounjaro-weekly on Thursday -last dose 05/27/23 -Other usual morning medications can be taken with sips of water including aspirin 81 mg.  Plan to go home the same day, you will only stay overnight if medically necessary.  You must have responsible adult to drive you home.  Someone must be with you the first 24 hours after you arrive home.  Reviewed procedure instructions with patient.

## 2023-06-02 ENCOUNTER — Encounter (HOSPITAL_COMMUNITY)
Admission: RE | Disposition: A | Discharge status: Home / Self Care | Payer: Self-pay | Source: Home / Self Care | Attending: Cardiology

## 2023-06-02 ENCOUNTER — Other Ambulatory Visit: Payer: Self-pay

## 2023-06-02 ENCOUNTER — Encounter (HOSPITAL_COMMUNITY): Payer: Self-pay | Admitting: Cardiology

## 2023-06-02 ENCOUNTER — Ambulatory Visit (HOSPITAL_COMMUNITY)
Admission: RE | Admit: 2023-06-02 | Discharge: 2023-06-02 | Disposition: A | Discharge status: Home / Self Care | Attending: Cardiology | Admitting: Cardiology

## 2023-06-02 DIAGNOSIS — E785 Hyperlipidemia, unspecified: Secondary | ICD-10-CM | POA: Insufficient documentation

## 2023-06-02 DIAGNOSIS — Z79899 Other long term (current) drug therapy: Secondary | ICD-10-CM | POA: Diagnosis not present

## 2023-06-02 DIAGNOSIS — I25118 Atherosclerotic heart disease of native coronary artery with other forms of angina pectoris: Secondary | ICD-10-CM | POA: Insufficient documentation

## 2023-06-02 DIAGNOSIS — Z7982 Long term (current) use of aspirin: Secondary | ICD-10-CM | POA: Diagnosis not present

## 2023-06-02 DIAGNOSIS — I2510 Atherosclerotic heart disease of native coronary artery without angina pectoris: Secondary | ICD-10-CM

## 2023-06-02 DIAGNOSIS — Z955 Presence of coronary angioplasty implant and graft: Secondary | ICD-10-CM | POA: Diagnosis not present

## 2023-06-02 DIAGNOSIS — I10 Essential (primary) hypertension: Secondary | ICD-10-CM | POA: Diagnosis not present

## 2023-06-02 DIAGNOSIS — E119 Type 2 diabetes mellitus without complications: Secondary | ICD-10-CM | POA: Insufficient documentation

## 2023-06-02 DIAGNOSIS — R9439 Abnormal result of other cardiovascular function study: Secondary | ICD-10-CM | POA: Diagnosis present

## 2023-06-02 DIAGNOSIS — I2584 Coronary atherosclerosis due to calcified coronary lesion: Secondary | ICD-10-CM | POA: Insufficient documentation

## 2023-06-02 DIAGNOSIS — F17210 Nicotine dependence, cigarettes, uncomplicated: Secondary | ICD-10-CM | POA: Diagnosis not present

## 2023-06-02 HISTORY — PX: LEFT HEART CATH AND CORONARY ANGIOGRAPHY: CATH118249

## 2023-06-02 LAB — GLUCOSE, CAPILLARY
Glucose-Capillary: 99 mg/dL (ref 70–99)
Glucose-Capillary: 92 mg/dL (ref 70–99)

## 2023-06-02 SURGERY — LEFT HEART CATH AND CORONARY ANGIOGRAPHY
Anesthesia: LOCAL

## 2023-06-02 MED ORDER — FENTANYL CITRATE (PF) 100 MCG/2ML IJ SOLN
INTRAMUSCULAR | Status: DC | PRN
  Administered 2023-06-02 (×2): 25 ug via INTRAVENOUS

## 2023-06-02 MED ORDER — MIDAZOLAM HCL 2 MG/2ML IJ SOLN
INTRAMUSCULAR | Status: AC
  Filled 2023-06-02: qty 2

## 2023-06-02 MED ORDER — IOHEXOL 350 MG/ML SOLN
INTRAVENOUS | Status: DC | PRN
  Administered 2023-06-02: 45 mL

## 2023-06-02 MED ORDER — LIDOCAINE HCL (PF) 1 % IJ SOLN
INTRAMUSCULAR | Status: AC
  Filled 2023-06-02: qty 30

## 2023-06-02 MED ORDER — LIDOCAINE HCL (PF) 1 % IJ SOLN
INTRAMUSCULAR | Status: DC | PRN
  Administered 2023-06-02: 2 mL

## 2023-06-02 MED ORDER — SODIUM CHLORIDE 0.9 % WEIGHT BASED INFUSION: 3.0000 mL/kg/h | INTRAVENOUS | Status: AC

## 2023-06-02 MED ORDER — HEPARIN SODIUM (PORCINE) 1000 UNIT/ML IJ SOLN
INTRAMUSCULAR | Status: AC
  Filled 2023-06-02: qty 10

## 2023-06-02 MED ORDER — SODIUM CHLORIDE 0.9 % WEIGHT BASED INFUSION: 1.0000 mL/kg/h | INTRAVENOUS | Status: DC

## 2023-06-02 MED ORDER — HEPARIN SODIUM (PORCINE) 1000 UNIT/ML IJ SOLN
INTRAMUSCULAR | Status: DC | PRN
  Administered 2023-06-02: 5000 [IU] via INTRAVENOUS

## 2023-06-02 MED ORDER — VERAPAMIL HCL 2.5 MG/ML IV SOLN
INTRAVENOUS | Status: DC | PRN
  Administered 2023-06-02: 10 mL via INTRA_ARTERIAL

## 2023-06-02 MED ORDER — ASPIRIN 81 MG PO CHEW: 81.0000 mg | CHEWABLE_TABLET | ORAL | Status: DC

## 2023-06-02 MED ORDER — ACETAMINOPHEN 325 MG PO TABS: 650.0000 mg | ORAL_TABLET | ORAL | Status: DC | PRN

## 2023-06-02 MED ORDER — ONDANSETRON HCL 4 MG/2ML IJ SOLN: 4.0000 mg | Freq: Four times a day (QID) | INTRAMUSCULAR | Status: DC | PRN

## 2023-06-02 MED ORDER — HEPARIN (PORCINE) IN NACL 2000-0.9 UNIT/L-% IV SOLN
INTRAVENOUS | Status: DC | PRN
  Administered 2023-06-02: 1000 mL

## 2023-06-02 MED ORDER — VERAPAMIL HCL 2.5 MG/ML IV SOLN
INTRAVENOUS | Status: AC
  Filled 2023-06-02: qty 2

## 2023-06-02 MED ORDER — MIDAZOLAM HCL 2 MG/2ML IJ SOLN
INTRAMUSCULAR | Status: DC | PRN
  Administered 2023-06-02: 1 mg via INTRAVENOUS
  Administered 2023-06-02: 2 mg via INTRAVENOUS

## 2023-06-02 MED ORDER — FENTANYL CITRATE (PF) 100 MCG/2ML IJ SOLN
INTRAMUSCULAR | Status: AC
  Filled 2023-06-02: qty 2

## 2023-06-02 MED ORDER — SODIUM CHLORIDE 0.9% FLUSH: 3.0000 mL | INTRAVENOUS | Status: DC | PRN

## 2023-06-02 MED ORDER — SODIUM CHLORIDE 0.9 % IV SOLN: 250.0000 mL | INTRAVENOUS | Status: DC | PRN

## 2023-06-02 SURGICAL SUPPLY — 7 items
CATH INFINITI AMBI 5FR TG (CATHETERS) IMPLANT
DEVICE RAD COMP TR BAND LRG (VASCULAR PRODUCTS) IMPLANT
GLIDESHEATH SLEND A-KIT 6F 22G (SHEATH) IMPLANT
GUIDEWIRE INQWIRE 1.5J.035X260 (WIRE) IMPLANT
INQWIRE 1.5J .035X260CM (WIRE) ×1 IMPLANT
PACK CARDIAC CATHETERIZATION (CUSTOM PROCEDURE TRAY) ×1 IMPLANT
SET ATX-X65L (MISCELLANEOUS) IMPLANT

## 2023-06-02 NOTE — Discharge Instructions (Signed)

## 2023-06-02 NOTE — Interval H&P Note (Signed)
 History and Physical Interval Note:  06/02/2023 8:23 AM  Carl [name]  has presented today for surgery, with the diagnosis of abnormal stress test.  The various methods of treatment have been discussed with the patient and family. After consideration of risks, benefits and other options for treatment, the patient has consented to  Procedure(s): LEFT HEART CATH AND CORONARY ANGIOGRAPHY (N/A) and possible coronary angioplasty as a surgical intervention.  The patient's history has been reviewed, patient examined, no change in status, stable for surgery.  I have reviewed the patient's chart and labs.  Questions were answered to the patient's satisfaction.   Cath Lab Visit (complete for each Cath Lab visit)  Clinical Evaluation Leading to the Procedure:   ACS: No.  Non-ACS:    Anginal Classification: CCS I  Anti-ischemic medical therapy: Maximal Therapy (2 or more classes of medications)  Non-Invasive Test Results: High-risk stress test findings: cardiac mortality >3%/year  Prior CABG: No previous CABG   Yates Decamp

## 2023-06-03 ENCOUNTER — Telehealth: Payer: Self-pay | Admitting: *Deleted

## 2023-06-03 ENCOUNTER — Telehealth: Payer: Self-pay | Admitting: Cardiology

## 2023-06-03 DIAGNOSIS — Z0279 Encounter for issue of other medical certificate: Secondary | ICD-10-CM

## 2023-06-03 NOTE — Telephone Encounter (Signed)
 Per [facility]-pt should schedule follow up appointment with Dr Wyline Mood in 4-6 weeks Staff message with request to schedule office visit sent to [facility].

## 2023-06-03 NOTE — Telephone Encounter (Signed)
 PT brought in FMLA forms to be completed. Paid $29 in cash. Given to Dr.Branchs nurse Inocencio Homes.

## 2023-06-09 NOTE — Telephone Encounter (Signed)
 Pt calling in for a status on this paperwork. Please advise.

## 2023-06-11 ENCOUNTER — Telehealth: Payer: Self-pay | Admitting: Cardiology

## 2023-06-11 NOTE — Telephone Encounter (Signed)
 FMLA forms completed by Dr.Branch. Faxed forms to [phone] per pt request. Faxed billing to drop charge for forms. Called pt he will pick up forms today.

## 2023-06-11 NOTE — Telephone Encounter (Signed)
 Pt  came into [place] office and expressed that the [employer] needed more information. He signed an updated release of information to allow us  to fax all medical office notes, all medication, and operative notes to [phone].   Faxed [employer] the form for them to fax all information.   Pt would like a call when [employer] faxes them to the company.

## 2023-06-11 NOTE — Telephone Encounter (Signed)
 Pt picked up forms.

## 2023-06-15 NOTE — Telephone Encounter (Signed)
 Pt  came into [place] office and expressed that the [place] [employer] did not receive the fax from records dept and he needed the information faxed as soon as possible to [phone]. I faxed all information to [facility] while pt was in office. PT signed release on 4/18!

## 2023-07-08 ENCOUNTER — Encounter: Payer: Self-pay | Admitting: Family Medicine

## 2023-07-08 ENCOUNTER — Ambulatory Visit: Payer: BC Managed Care – PPO | Admitting: Family Medicine

## 2023-07-08 VITALS — BP 129/86 | HR 88 | Ht 70.0 in | Wt 252.0 lb

## 2023-07-08 DIAGNOSIS — G4733 Obstructive sleep apnea (adult) (pediatric): Secondary | ICD-10-CM | POA: Diagnosis not present

## 2023-07-08 DIAGNOSIS — Z7984 Long term (current) use of oral hypoglycemic drugs: Secondary | ICD-10-CM

## 2023-07-08 DIAGNOSIS — I152 Hypertension secondary to endocrine disorders: Secondary | ICD-10-CM

## 2023-07-08 DIAGNOSIS — E785 Hyperlipidemia, unspecified: Secondary | ICD-10-CM

## 2023-07-08 DIAGNOSIS — F4321 Adjustment disorder with depressed mood: Secondary | ICD-10-CM

## 2023-07-08 DIAGNOSIS — E1159 Type 2 diabetes mellitus with other circulatory complications: Secondary | ICD-10-CM | POA: Diagnosis not present

## 2023-07-08 DIAGNOSIS — E1169 Type 2 diabetes mellitus with other specified complication: Secondary | ICD-10-CM | POA: Diagnosis not present

## 2023-07-08 LAB — BAYER DCA HB A1C WAIVED: HB A1C (BAYER DCA - WAIVED): 5.5 % (ref 4.8–5.6)

## 2023-07-08 NOTE — Progress Notes (Addendum)
 BP 129/86   Pulse 88   Ht 5\' 10"  (1.778 m)   Wt 252 lb (114.3 kg)   SpO2 97%   BMI 36.16 kg/m    Subjective:   Patient ID: [name], male    DOB: 1975-05-01, 48 y.o.   MRN: [id number]  HPI: Carl [name] is a 48 y.o. male presenting on 07/08/2023 for Medical Management of Chronic Issues, Diabetes, Hyperlipidemia, and Hypertension   HPI Type 2 diabetes mellitus Patient comes in today for recheck of his diabetes. Patient has been currently taking Jardiance  and Mounjaro . Patient is currently on an ACE inhibitor/ARB. Patient has not seen an ophthalmologist this year. Patient denies any new issues with their feet. The symptom started onset as an adult hypertension and hyperlipidemia ARE RELATED TO DM   Hypertension Patient is currently on amlodipine  and lisinopril  and metoprolol , and their blood pressure today is 129/86. Patient denies any lightheadedness or dizziness. Patient denies headaches, blurred vision, chest pains, shortness of breath, or weakness. Denies any side effects from medication and is content with current medication.   Hyperlipidemia Patient is coming in for recheck of his hyperlipidemia. The patient is currently taking atorvastatin . They deny any issues with myalgias or history of liver damage from it. They deny any focal numbness or weakness or chest pain.   Patient has sleep apnea since 2017 and uses the machine but he does get quite old needs renewed but will likely need a new sleep study because it has been so long.  Will place referral for it.  Adjustment with anxiety depression Patient is coming in today feeling more depressed and down.  He says his fiance who had been in a relationship with some time died from some heart complication about a month ago.  He says he has been heavily feeling the loss especially of the relationship and they were planning to have more children as all really hit him very hard at this point.  Denies any suicidal ideations or  thoughts to hurt himself today but does have some thoughts at different times.    10/02/2021    1:15 PM 06/26/2021    3:36 PM 04/03/2021    3:51 PM 01/02/2021    1:25 PM 10/03/2020    2:03 PM  Depression screen PHQ 2/9  Decreased Interest 3 3 3 3 3   Down, Depressed, Hopeless 3 3 3 3 2   PHQ - 2 Score 6 6 6 6 5   Altered sleeping 3 3 3 3 2   Tired, decreased energy 3 3 3 3 3   Change in appetite 3 3 3 3 3   Feeling bad or failure about yourself  3 3 3 3 3   Trouble concentrating 3 3 3 3 3   Moving slowly or fidgety/restless 3 3 3 3 3   Suicidal thoughts 3 3 3 3 3   PHQ-9 Score 27 27 27 27 25   Difficult doing work/chores  Extremely dIfficult        Relevant past medical, surgical, family and social history reviewed and updated as indicated. Interim medical history since our last visit reviewed. Allergies and medications reviewed and updated.  Review of Systems  Constitutional:  Negative for chills and fever.  Eyes:  Negative for visual disturbance.  Respiratory:  Negative for shortness of breath and wheezing.   Cardiovascular:  Negative for chest pain and leg swelling.  Musculoskeletal:  Negative for back pain and gait problem.  Skin:  Negative for rash.  Neurological:  Negative for dizziness, weakness and  light-headedness.  Psychiatric/Behavioral:  Positive for decreased concentration, dysphoric mood and suicidal ideas. Negative for self-injury and sleep disturbance. The patient is nervous/anxious.   All other systems reviewed and are negative.   Per HPI unless specifically indicated above   Allergies as of 07/08/2023   No Known Allergies      Medication List        Accurate as of Jul 08, 2023 10:36 AM. If you have any questions, ask your nurse or doctor.          amLODipine  2.5 MG tablet Commonly known as: NORVASC  TAKE 1 TABLET BY MOUTH DAILY   aspirin  EC 81 MG tablet Take 81 mg by mouth daily.   atorvastatin  80 MG tablet Commonly known as: LIPITOR  Take 1 tablet (80  mg total) by mouth daily.   b complex vitamins capsule Take 1 capsule by mouth daily.   Jardiance  10 MG Tabs tablet Generic drug: empagliflozin  TAKE 1 TABLET DAILY BEFORE BREAKFAST   lisinopril  5 MG tablet Commonly known as: ZESTRIL  Take 1 tablet (5 mg total) by mouth daily.   metoprolol  succinate 50 MG 24 hr tablet Commonly known as: TOPROL -XL TAKE 1 TABLET BY MOUTH EVERY DAY with OR immediately AFTER a meal   nitroGLYCERIN  0.4 MG SL tablet Commonly known as: NITROSTAT  Place 1 tablet (0.4 mg total) under the tongue every 5 (five) minutes x 3 doses as needed for chest pain (if no relief after 2nd dose, proceed to ED or call 911).   omeprazole  40 MG capsule Commonly known as: PRILOSEC Take 1 capsule (40 mg total) by mouth daily.   tirzepatide  7.5 MG/0.5ML Pen Commonly known as: MOUNJARO  Inject 7.5 mg into the skin once a week.   vitamin C 1000 MG tablet Take 2,000 mg by mouth daily.         Objective:   BP 129/86   Pulse 88   Ht 5\' 10"  (1.778 m)   Wt 252 lb (114.3 kg)   SpO2 97%   BMI 36.16 kg/m   Wt Readings from Last 3 Encounters:  07/08/23 252 lb (114.3 kg)  06/02/23 245 lb (111.1 kg)  05/21/23 249 lb (112.9 kg)    Physical Exam Vitals and nursing note reviewed.  Constitutional:      General: He is not in acute distress.    Appearance: He is well-developed. He is not diaphoretic.  Eyes:     General: No scleral icterus.    Conjunctiva/sclera: Conjunctivae normal.  Neck:     Thyroid : No thyromegaly.  Cardiovascular:     Rate and Rhythm: Normal rate and regular rhythm.     Heart sounds: Normal heart sounds. No murmur heard. Pulmonary:     Effort: Pulmonary effort is normal. No respiratory distress.     Breath sounds: Normal breath sounds. No wheezing.  Musculoskeletal:        General: No swelling. Normal range of motion.     Cervical back: Neck supple.  Lymphadenopathy:     Cervical: No cervical adenopathy.  Skin:    General: Skin is warm and  dry.     Findings: No rash.  Neurological:     Mental Status: He is alert and oriented to person, place, and time.     Coordination: Coordination normal.  Psychiatric:        Behavior: Behavior normal.       Assessment & Plan:   Problem List Items Addressed This Visit       Cardiovascular and Mediastinum  Hypertension associated with diabetes (HCC)     Respiratory   OSA (obstructive sleep apnea)   Relevant Orders   Ambulatory referral to Sleep Studies     Endocrine   Hyperlipidemia associated with type 2 diabetes mellitus (HCC)   Type 2 diabetes mellitus with other specified complication (HCC) - Primary   Relevant Orders   Bayer DCA Hb A1c Waived   Other Visit Diagnoses       Adjustment disorder with depressed mood       Relevant Orders   Ambulatory referral to Psychology       Denies any plan of hurting himself today or suicide.  Opted not to go with medicine today, he would like to try counseling and will place urgent referral to a counselor over in [place].  A1c looks good at 5.5 blood pressure and heart rate everything else looks good. Follow up plan: Return in about 3 months (around 10/08/2023), or if symptoms worsen or fail to improve, for Diabetes.  Counseling provided for all of the vaccine components Orders Placed This Encounter  Procedures   Bayer DCA Hb A1c Waived   Ambulatory referral to Psychology   Ambulatory referral to Sleep Studies    Jolyne Needs, MD [facility] 07/08/2023, 10:36 AM

## 2023-07-08 NOTE — Addendum Note (Signed)
 Addended by: Jolyne Needs on: 07/08/2023 10:36 AM   Modules accepted: Orders

## 2023-07-27 ENCOUNTER — Telehealth: Payer: Self-pay | Admitting: Family Medicine

## 2023-07-27 NOTE — Telephone Encounter (Signed)
 Patient's Sleep Medicine Referral was sent to [facility] as Patient requested.  Patient's BH Referral was sent to [facility] but Patient declined Appt for scheduling due to Office scheduling into August.  [facility] or not accepting any outside Referrals for [facility] at this time, Patient will need to call  Office back to reschedule as all Office's are scheduling a couple months out.  If Patient need immediate assistance, He can be a walk in to [facility] in [place] or [facility].

## 2023-07-27 NOTE — Telephone Encounter (Signed)
 Can referrals confirm where this sleep study referral is being sent? Also check status of grief counselor. I do not see this one in the system, but that could be if its marked confidential for being mental health related.    Copied from CRM [id number]. Topic: Referral - Question >> Jul 27, 2023 11:33 AM Lizabeth Riggs wrote: Reason for CRM:  Carl [name] has not been contacted for the sleep study to get is new machine. Also, the grief counselor did not have an appointment until September. He wants to see about sending  the referral to someone with a sooner appointment. Please call Trea to discuss both issues at [phone]. Thanks

## 2023-07-28 NOTE — Telephone Encounter (Signed)
 Pt made aware of all. He did receive a call about the sleep study this am and has an appt scheduled.  He does not want to call [facility] with [facility] and he does not want to walk in to [facility] urgent care in [place] or [place]. States that he will just handle things on his own. Strongly advised pt to walk into one of the urgent cares if he needs immediate help. Explained that the [facility] urgent care is not like a traditional urgent care. That they are built for mental health illnesses.

## 2023-08-11 ENCOUNTER — Other Ambulatory Visit: Payer: Self-pay | Admitting: Cardiology

## 2023-08-12 ENCOUNTER — Other Ambulatory Visit: Payer: Self-pay | Admitting: Cardiology

## 2023-08-12 ENCOUNTER — Other Ambulatory Visit: Payer: Self-pay | Admitting: Family Medicine

## 2023-08-12 DIAGNOSIS — E1169 Type 2 diabetes mellitus with other specified complication: Secondary | ICD-10-CM

## 2023-08-23 ENCOUNTER — Other Ambulatory Visit: Payer: Self-pay | Admitting: *Deleted

## 2023-08-23 DIAGNOSIS — I152 Hypertension secondary to endocrine disorders: Secondary | ICD-10-CM

## 2023-08-23 MED ORDER — LISINOPRIL 5 MG PO TABS: 5.0000 mg | ORAL_TABLET | Freq: Every day | ORAL | 0 refills | Status: DC

## 2023-09-02 ENCOUNTER — Encounter: Payer: Self-pay | Admitting: Cardiology

## 2023-09-02 ENCOUNTER — Ambulatory Visit: Attending: Cardiology | Admitting: Cardiology

## 2023-09-02 VITALS — BP 104/64 | HR 79 | Ht 70.0 in | Wt 248.8 lb

## 2023-09-02 DIAGNOSIS — I10 Essential (primary) hypertension: Secondary | ICD-10-CM

## 2023-09-02 DIAGNOSIS — E782 Mixed hyperlipidemia: Secondary | ICD-10-CM

## 2023-09-02 DIAGNOSIS — I25118 Atherosclerotic heart disease of native coronary artery with other forms of angina pectoris: Secondary | ICD-10-CM | POA: Diagnosis not present

## 2023-09-02 NOTE — Progress Notes (Signed)
 Clinical Summary [name] is a 48 y.o.male seen today for follow up of the following medical problem.s    1. CAD with chronic stable angina - abnormal exericse stress in 2015. - cath 2015 at [facility] with occluded proximal LAD, LAD filled with left-left and right-left collaterals. Medically managed  - echo Jan 2018 LVEF 55-60%     - nuclear stress Jan 2018 showed apical and distal anterolateral infarct with ischemia. Overall intermediate to high risk.      06/2017 cath with prox LAD 100%, RCA prox 50%, RPDA 40%, norma LCX - he was referred for evaluation for CTO intervention on his LAD - 08/2017 succesful CTO PCI of LAD with DES x 2.    Imdur  stopped that admission due to headaches.    07/2021 nuclear stress: inferior st depressions, ischemia distal anterior and apical walls. Rated as low to intermediate risk. SDS 4.    - occasional slight discomfort, midchest/epigastric. Not specifically exertional, last about 1-2 minutes. No other associated symptoms. Occurs at most twice a month.  - compliant with meds   Nuclear stress images reviewed in full. Moderate size moderate intensity distal anterior/apical defect that is fixed. Small mild intensity inferior defect that is reversible. Elevated TID of 1.32 by measurements and visually, which may suggest a component of balance ischemia and higher cardiac risk. 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 He has had chronic fairly stable mild chest pains.   05/2023 cath: ostial LAD 40%, D1 occluded but fills by collaterals, LCX 40%, RCA 50% - recs for medical management  - chronic unchanged mild infrequent chest pains - compliant with meds    2. Hyperlipidemia  12/2021 TC 103 TG 222 HDL 23 LDL 44 - 06/2022 TC 895 TG 882 HDL 30 LDL 53 - 03/2023 TC 899 TG 886 HDL 31 LDL 48   3. HTN - he is compliant with meds   4. DM2 - recent A1c was 5.5 - glycemic control per pcp   SH: works at [employer] just recently passed away  unexpectedly  Past Medical History:  Diagnosis Date   Anxiety    CAD in native artery    a. cath in 2015 [facility] showing occluded proximal LAD, LAD filled with left-left and right-left collaterals, otherwise 20-30% prox-mid RCA, LVEF 55%. b. Abnormal nuc 02/2016 - [facility] medically. c. redo cath in 06/2017 showing 100% Prox LAD stenosis, 50% RCA, and 40% RPDA and underwent CTO PCI of the LAD with DESx2 to in 08/2017   Daily headache    since I started Plavix  (08/25/2017)   Depression    Diabetes mellitus type 2 in obese    Hyperlipidemia    Hypertension    OSA on CPAP      No Known Allergies   Current Outpatient Medications  Medication Sig Dispense Refill   amLODipine  (NORVASC ) 2.5 MG tablet TAKE 1 TABLET BY MOUTH DAILY 90 tablet 1   Ascorbic Acid (VITAMIN C) 1000 MG tablet Take 2,000 mg by mouth daily.     aspirin  EC 81 MG tablet Take 81 mg by mouth daily.      atorvastatin  (LIPITOR ) 80 MG tablet TAKE 1 TABLET BY MOUTH DAILY 90 tablet 3   b complex vitamins capsule Take 1 capsule by mouth daily.     empagliflozin  (JARDIANCE ) 10 MG TABS tablet TAKE 1 TABLET DAILY BEFORE BREAKFAST 90 tablet 2   lisinopril  (ZESTRIL ) 5 MG tablet Take 1 tablet (5 mg total) by  mouth daily. 90 tablet 0   metoprolol  succinate (TOPROL -XL) 50 MG 24 hr tablet TAKE 1 TABLET BY MOUTH EVERY DAY with OR immediately AFTER a meal 90 tablet 3   nitroGLYCERIN  (NITROSTAT ) 0.4 MG SL tablet Place 1 tablet (0.4 mg total) under the tongue every 5 (five) minutes x 3 doses as needed for chest pain (if no relief after 2nd dose, proceed to ED or call 911). 25 tablet 3   omeprazole  (PRILOSEC) 40 MG capsule Take 1 capsule (40 mg total) by mouth daily. 90 capsule 3   tirzepatide  (MOUNJARO ) 7.5 MG/0.5ML Pen Inject 7.5 mg into the skin once a week. 6 mL 3   No current facility-administered medications for this visit.     Past Surgical History:  Procedure Laterality Date   CORONARY CTO INTERVENTION N/A 08/25/2017   Procedure:  CORONARY CTO INTERVENTION;  Surgeon: Swaziland, Peter M, MD;  Location: [facility] INVASIVE CV LAB;  Service: Cardiovascular;  Laterality: N/A;   LEFT HEART CATH AND CORONARY ANGIOGRAPHY N/A 07/21/2017   Procedure: LEFT HEART CATH AND CORONARY ANGIOGRAPHY;  Surgeon: Claudene Victory ORN, MD;  Location: [facility] INVASIVE CV LAB;  Service: Cardiovascular;  Laterality: N/A;   LEFT HEART CATH AND CORONARY ANGIOGRAPHY N/A 06/02/2023   Procedure: LEFT HEART CATH AND CORONARY ANGIOGRAPHY;  Surgeon: Ladona Heinz, MD;  Location: [facility] INVASIVE CV LAB;  Service: Cardiovascular;  Laterality: N/A;   MOLE REMOVAL Right    chin   TUMOR EXCISION Left 1991   knee area     No Known Allergies    Family History  Problem Relation Age of Onset   Diabetes Mother    Cancer Mother    Diabetes Father      Social History Carl [name] reports that he has been smoking cigarettes. He started smoking about 30 years ago. He has a 30.7 pack-year smoking history. He has never used smokeless tobacco. Carl [name] reports current alcohol use.    Physical Examination Today's Vitals   09/02/23 1337  BP: 104/64  Pulse: 79  SpO2: 95%  Weight: 248 lb 12.8 oz (112.9 kg)  Height: 5' 10 (1.778 m)   Body mass index is 35.7 kg/m.  Gen: resting comfortably, no acute distress HEENT: no scleral icterus, pupils equal round and reactive, no palptable cervical adenopathy,  CV: RRR, no mrg, no jvd Resp: Clear to auscultation bilaterally GI: abdomen is soft, non-tender, non-distended, normal bowel sounds, no hepatosplenomegaly MSK: extremities are warm, no edema.  Skin: warm, no rash Neuro:  no focal deficits Psych: appropriate affect   Diagnostic Studies  05/2020 GXT Patient achieved a maximum workload of 7 METS, MPHR 86%. There were equivocal ST segment changes, 0.5 mm ST segment depression in the inferior leads. Nonlimiting, vague chest discomfort reported. Hypertensive response noted to exercise. There were no arrhythmias. Intermediate Duke  treadmill score of -1. Blood pressure demonstrated a hypertensive response to exercise.     07/2021 nuclear stress Exercise sterss test:  Clinically negative, electrically positive with ST depression in inferior leads that improved in the recovery period   Myoview  scan with ischemia in the distal anterior and apical walls   LVEF on gating calculated at 51% wiht mild hypokinesis of the distal anterior wall   Low to intermediate risk scan.  Compared to myoview  scan done in 2018, defect in similar distribution but less severe in current study.   04/2023 nuclear stress Exercised for 6 minutes and 54 seconds, per Bruce protocol, achieving 9.70 METS. Normal exercise capacity. Normal BP  and normal HR response. No angina during the test. Stress ECG is uninterpretable due to artifact but recovery ECG showed <1 mm ST depression in the inferior leads.   Stress ECG is negative for ischemia. Duke Treadmill score is +4, medium risk.   LV perfusion is abnormal. There is evidence of ischemia. There is no evidence of infarction. Defect 1: There is a small reversible perfusion defect in the mid inferior wall with normal wall motion in the defect area consistent with ischemia.   Left ventricular function is abnormal. Nuclear stress EF: 51%. Visually LVEF appears to be low normal. End diastolic cavity size is moderately enlarged. End systolic cavity size is mildly enlarged.   Transient Ischemic Dilation (TID) is 1.32, visual and quantitative. Marker for high risk coronary artery disease.   Findings are consistent with ischemia and no infarction. The study is high risk.   Assessment and Plan   1. CAD with chronic stable angina - infrequent mild chest pain at times, recent cath was reassuring.  - very mild infrequent symptoms, overall continues to do well - continue current medical therapy   2. Hyperlipidema -LDL at goal, continue current meds   3. HTN -bp is at goal, continue current meds  F/u 6  months     Dorn PHEBE Ross, M.D.

## 2023-09-02 NOTE — Patient Instructions (Signed)
 Medication Instructions:  Continue all current medications.   Labwork: none  Testing/Procedures: none  Follow-Up: 6 months   Any Other Special Instructions Will Be Listed Below (If Applicable).   If you need a refill on your cardiac medications before your next appointment, please call your pharmacy.

## 2023-09-12 ENCOUNTER — Other Ambulatory Visit: Payer: Self-pay | Admitting: Cardiology

## 2023-09-12 DIAGNOSIS — I152 Hypertension secondary to endocrine disorders: Secondary | ICD-10-CM

## 2023-09-13 ENCOUNTER — Other Ambulatory Visit: Payer: Self-pay | Admitting: Family Medicine

## 2023-09-13 ENCOUNTER — Other Ambulatory Visit: Payer: Self-pay | Admitting: Cardiology

## 2023-09-14 ENCOUNTER — Other Ambulatory Visit: Payer: Self-pay

## 2023-09-14 DIAGNOSIS — E1169 Type 2 diabetes mellitus with other specified complication: Secondary | ICD-10-CM

## 2023-09-14 MED ORDER — ATORVASTATIN CALCIUM 80 MG PO TABS: 80.0000 mg | ORAL_TABLET | Freq: Every day | ORAL | 3 refills | Status: DC

## 2023-09-22 ENCOUNTER — Other Ambulatory Visit: Payer: Self-pay | Admitting: Family Medicine

## 2023-10-14 ENCOUNTER — Ambulatory Visit: Admitting: Family Medicine

## 2023-10-14 ENCOUNTER — Encounter: Payer: Self-pay | Admitting: Family Medicine

## 2023-10-14 VITALS — BP 115/80 | HR 74 | Ht 70.0 in | Wt 249.0 lb

## 2023-10-14 DIAGNOSIS — I152 Hypertension secondary to endocrine disorders: Secondary | ICD-10-CM

## 2023-10-14 DIAGNOSIS — Z7984 Long term (current) use of oral hypoglycemic drugs: Secondary | ICD-10-CM

## 2023-10-14 DIAGNOSIS — E1169 Type 2 diabetes mellitus with other specified complication: Secondary | ICD-10-CM | POA: Diagnosis not present

## 2023-10-14 DIAGNOSIS — E785 Hyperlipidemia, unspecified: Secondary | ICD-10-CM

## 2023-10-14 DIAGNOSIS — E1159 Type 2 diabetes mellitus with other circulatory complications: Secondary | ICD-10-CM | POA: Diagnosis not present

## 2023-10-14 LAB — BAYER DCA HB A1C WAIVED: HB A1C (BAYER DCA - WAIVED): 5.5 % (ref 4.8–5.6)

## 2023-10-14 NOTE — Progress Notes (Signed)
 BP 115/80   Pulse 74   Ht 5' 10 (1.778 m)   Wt 249 lb (112.9 kg)   SpO2 96%   BMI 35.73 kg/m    Subjective:   Patient ID: Carl [name], male    DOB: October 22, 1975, 48 y.o.   MRN: [id number]  HPI: Carl [name] is a 48 y.o. male presenting on 10/14/2023 for Medical Management of Chronic Issues and Diabetes   Discussed the use of AI scribe software for clinical note transcription with the patient, who gave verbal consent to proceed.  History of Present Illness   Carl [name] is a 48 year old with diabetes and [name] who presents for medication management and follow-up.  Carl is currently taking Mounjaro  for diabetes management, but the dosage is uncertain. Carl plans to verify the correct dose at home. Carl also takes Jardiance  without major side effects and is awaiting results to confirm its effectiveness.  Carl takes metoprolol , lisinopril , and amlodipine  for blood pressure and reports no issues with these medications.  Carl is on atorvastatin  for cholesterol management and does not report any specific side effects, although Carl mentions taking multiple medications and not being able to distinguish which might cause minor side effects like gas.  Carl takes medication for heartburn, which was initially prescribed for gas issues rather than heartburn itself. Carl experienced a flare-up of gas a few days ago, possibly due to eating something acidic like oranges or tomatoes. Carl typically eats once a day and is considering adjusting the timing of his medication if needed. The flare-up was an isolated incident.          Relevant past medical, surgical, family and social history reviewed and updated as indicated. Interim medical history since our last visit reviewed. Allergies and medications reviewed and updated.  Review of Systems  Constitutional:  Negative for chills and fever.  Eyes:  Negative for visual disturbance.  Respiratory:  Negative for shortness of breath and wheezing.    Cardiovascular:  Negative for chest pain and leg swelling.  Musculoskeletal:  Negative for back pain and gait problem.  Skin:  Negative for rash.  Neurological:  Negative for dizziness and light-headedness.  All other systems reviewed and are negative.   Per HPI unless specifically indicated above   Allergies as of 10/14/2023   No Known Allergies      Medication List        Accurate as of October 14, 2023 10:37 AM. If you have any questions, ask your nurse or doctor.          amLODipine  2.5 MG tablet Commonly known as: NORVASC  TAKE 1 TABLET BY MOUTH DAILY   aspirin  EC 81 MG tablet Take 81 mg by mouth daily.   atorvastatin  80 MG tablet Commonly known as: LIPITOR  Take 1 tablet (80 mg total) by mouth daily.   b complex vitamins capsule Take 1 capsule by mouth daily.   Jardiance  10 MG Tabs tablet Generic drug: empagliflozin  TAKE 1 TABLET DAILY BEFORE BREAKFAST   lisinopril  5 MG tablet Commonly known as: ZESTRIL  Take 1 tablet (5 mg total) by mouth daily.   metoprolol  succinate 50 MG 24 hr tablet Commonly known as: TOPROL -XL TAKE 1 TABLET BY MOUTH EVERY DAY with OR immediately AFTER a meal   nitroGLYCERIN  0.4 MG SL tablet Commonly known as: NITROSTAT  Place 1 tablet (0.4 mg total) under the tongue every 5 (five) minutes x 3 doses as needed for chest pain (if no relief after 2nd dose,  proceed to ED or call 911).   omeprazole  40 MG capsule Commonly known as: PRILOSEC Take 1 capsule (40 mg total) by mouth daily.   tirzepatide  7.5 MG/0.5ML Pen Commonly known as: MOUNJARO  Inject 7.5 mg into the skin once a week.   vitamin C 1000 MG tablet Take 2,000 mg by mouth daily.         Objective:   BP 115/80   Pulse 74   Ht 5' 10 (1.778 m)   Wt 249 lb (112.9 kg)   SpO2 96%   BMI 35.73 kg/m   Wt Readings from Last 3 Encounters:  10/14/23 249 lb (112.9 kg)  09/02/23 248 lb 12.8 oz (112.9 kg)  07/08/23 252 lb (114.3 kg)    Physical Exam Physical Exam    VITALS: BP- 115/80 CHEST: Lungs clear to auscultation bilaterally. CARDIOVASCULAR: Heart regular rate and rhythm, normal heart sounds.         Assessment & Plan:   Problem List Items Addressed This Visit       Cardiovascular and Mediastinum   [name] associated with diabetes (HCC) - Primary   Relevant Orders   Bayer DCA Hb A1c Waived   CBC with Differential/Platelet   CMP14+EGFR   Lipid panel   PSA, total and free     Endocrine   Hyperlipidemia associated with type 2 diabetes mellitus (HCC)   Relevant Orders   Bayer DCA Hb A1c Waived   CBC with Differential/Platelet   CMP14+EGFR   Lipid panel   PSA, total and free   Type 2 diabetes mellitus with other specified complication (HCC)      Type 2 diabetes mellitus Managed with Mounjaro  and Jardiance  without major side effects. - Confirm Mounjaro  dosage and inform the clinic. - Continue Jardiance  as prescribed.  [name] Well-controlled with metoprolol , lisinopril , and amlodipine . Current BP 115/80 mmHg. - Continue metoprolol , lisinopril , and amlodipine .  Hyperlipidemia Managed with atorvastatin . - Continue atorvastatin  as prescribed.  Gastroesophageal reflux disease (GERD) Managed with heartburn medication. Recent flare-up possibly due to dietary factors. - If symptoms flare, take heartburn medication twice daily for a few days, then return to once daily. - Avoid potential dietary triggers such as acidic foods. - Consider dietary modifications to prevent future flare-ups.          Follow up plan: Return in about 3 months (around 01/14/2024), or if symptoms worsen or fail to improve, for Diabetes.  Counseling provided for all of the vaccine components Orders Placed This Encounter  Procedures   Bayer DCA Hb A1c Waived   CBC with Differential/Platelet   CMP14+EGFR   Lipid panel   PSA, total and free    Fonda Levins, MD [facility] 10/14/2023, 10:37 AM

## 2023-10-14 NOTE — Patient Instructions (Signed)
  VISIT SUMMARY: You came in today for a follow-up on your diabetes, hypertension, cholesterol, and heartburn management. We reviewed your current medications and discussed any issues or side effects you might be experiencing.  YOUR PLAN: -TYPE 2 DIABETES MELLITUS: Type 2 diabetes is a condition where your body does not use insulin properly, leading to high blood sugar levels. You are currently managing it with Mounjaro  and Jardiance . Please confirm your Mounjaro  dosage and inform the clinic. Continue taking Jardiance  as prescribed.  -HYPERTENSION: Hypertension, or high blood pressure, is when the force of your blood against your artery walls is too high. Your blood pressure is well-controlled with metoprolol , lisinopril , and amlodipine . Continue taking these medications as prescribed.  -HYPERLIPIDEMIA: Hyperlipidemia is having high levels of fats (lipids) in your blood, which can increase your risk of heart disease. You are managing it with atorvastatin . Continue taking atorvastatin  as prescribed.  -GASTROESOPHAGEAL REFLUX DISEASE (GERD): GERD is a condition where stomach acid frequently flows back into the tube connecting your mouth and stomach, causing heartburn. You had a recent flare-up possibly due to acidic foods. If symptoms flare up again, take your heartburn medication twice daily for a few days, then return to once daily. Avoid potential dietary triggers like acidic foods and consider dietary modifications to prevent future flare-ups.  INSTRUCTIONS: Please confirm your Mounjaro  dosage and inform the clinic. Continue with your current medications for diabetes, hypertension, and cholesterol as prescribed. If you experience another flare-up of heartburn, take your medication twice daily for a few days and avoid acidic foods. Follow up with the clinic if you have any concerns or if your symptoms do not improve.                      Contains text generated by Abridge.                                  Contains text generated by Abridge.

## 2023-10-15 LAB — CBC WITH DIFFERENTIAL/PLATELET
Basophils Absolute: 0 x10E3/uL (ref 0.0–0.2)
Basos: 1 %
EOS (ABSOLUTE): 0.1 x10E3/uL (ref 0.0–0.4)
Eos: 1 %
Hematocrit: 46.5 % (ref 37.5–51.0)
Hemoglobin: 15.5 g/dL (ref 13.0–17.7)
Immature Grans (Abs): 0 x10E3/uL (ref 0.0–0.1)
Immature Granulocytes: 0 %
Lymphocytes Absolute: 2.9 x10E3/uL (ref 0.7–3.1)
Lymphs: 33 %
MCH: 32.2 pg (ref 26.6–33.0)
MCHC: 33.3 g/dL (ref 31.5–35.7)
MCV: 97 fL (ref 79–97)
Monocytes Absolute: 0.8 x10E3/uL (ref 0.1–0.9)
Monocytes: 10 %
Neutrophils Absolute: 4.9 x10E3/uL (ref 1.4–7.0)
Neutrophils: 55 %
Platelets: 188 x10E3/uL (ref 150–450)
RBC: 4.82 x10E6/uL (ref 4.14–5.80)
RDW: 12.3 % (ref 11.6–15.4)
WBC: 8.8 x10E3/uL (ref 3.4–10.8)

## 2023-10-15 LAB — CMP14+EGFR
ALT: 37 IU/L (ref 0–44)
AST: 29 IU/L (ref 0–40)
Albumin: 4.5 g/dL (ref 4.1–5.1)
Alkaline Phosphatase: 84 IU/L (ref 44–121)
BUN/Creatinine Ratio: 12 (ref 9–20)
BUN: 12 mg/dL (ref 6–24)
Bilirubin Total: 0.6 mg/dL (ref 0.0–1.2)
CO2: 21 mmol/L (ref 20–29)
Calcium: 9.3 mg/dL (ref 8.7–10.2)
Chloride: 101 mmol/L (ref 96–106)
Creatinine, Ser: 0.97 mg/dL (ref 0.76–1.27)
Globulin, Total: 2.6 g/dL (ref 1.5–4.5)
Glucose: 84 mg/dL (ref 70–99)
Potassium: 4.5 mmol/L (ref 3.5–5.2)
Sodium: 137 mmol/L (ref 134–144)
Total Protein: 7.1 g/dL (ref 6.0–8.5)
eGFR: 96 mL/min/1.73 (ref >59)

## 2023-10-15 LAB — LIPID PANEL
Chol/HDL Ratio: 3 ratio (ref 0.0–5.0)
Cholesterol, Total: 102 mg/dL (ref 100–199)
HDL: 34 mg/dL — ABNORMAL LOW (ref >39)
LDL Chol Calc (NIH): 51 mg/dL (ref 0–99)
Triglycerides: 87 mg/dL (ref 0–149)
VLDL Cholesterol Cal: 17 mg/dL (ref 5–40)

## 2023-10-15 LAB — PSA, TOTAL AND FREE
PSA, Free Pct: 50
PSA, Free: 0.15 ng/mL
Prostate Specific Ag, Serum: 0.3 ng/mL (ref 0.0–4.0)

## 2023-10-21 ENCOUNTER — Ambulatory Visit: Payer: Self-pay | Admitting: Family Medicine

## 2023-10-27 ENCOUNTER — Emergency Department (HOSPITAL_COMMUNITY)

## 2023-10-27 ENCOUNTER — Observation Stay (HOSPITAL_COMMUNITY)
Admission: EM | Admit: 2023-10-27 | Discharge: 2023-10-28 | Disposition: A | Discharge status: Home / Self Care | Attending: Cardiology | Admitting: Cardiology

## 2023-10-27 ENCOUNTER — Encounter (HOSPITAL_COMMUNITY): Payer: Self-pay

## 2023-10-27 ENCOUNTER — Other Ambulatory Visit: Payer: Self-pay

## 2023-10-27 ENCOUNTER — Encounter (HOSPITAL_COMMUNITY)
Admission: EM | Disposition: A | Discharge status: Home / Self Care | Payer: Self-pay | Source: Home / Self Care | Attending: Emergency Medicine

## 2023-10-27 DIAGNOSIS — E1159 Type 2 diabetes mellitus with other circulatory complications: Secondary | ICD-10-CM | POA: Insufficient documentation

## 2023-10-27 DIAGNOSIS — Z955 Presence of coronary angioplasty implant and graft: Secondary | ICD-10-CM

## 2023-10-27 DIAGNOSIS — I25110 Atherosclerotic heart disease of native coronary artery with unstable angina pectoris: Secondary | ICD-10-CM

## 2023-10-27 DIAGNOSIS — I2510 Atherosclerotic heart disease of native coronary artery without angina pectoris: Secondary | ICD-10-CM | POA: Diagnosis not present

## 2023-10-27 DIAGNOSIS — Z79899 Other long term (current) drug therapy: Secondary | ICD-10-CM | POA: Diagnosis not present

## 2023-10-27 DIAGNOSIS — I10 Essential (primary) hypertension: Secondary | ICD-10-CM | POA: Diagnosis not present

## 2023-10-27 DIAGNOSIS — Z720 Tobacco use: Secondary | ICD-10-CM | POA: Diagnosis present

## 2023-10-27 DIAGNOSIS — I25710 Atherosclerosis of autologous vein coronary artery bypass graft(s) with unstable angina pectoris: Secondary | ICD-10-CM | POA: Insufficient documentation

## 2023-10-27 DIAGNOSIS — R079 Chest pain, unspecified: Secondary | ICD-10-CM

## 2023-10-27 DIAGNOSIS — K219 Gastro-esophageal reflux disease without esophagitis: Secondary | ICD-10-CM | POA: Insufficient documentation

## 2023-10-27 DIAGNOSIS — I25720 Atherosclerosis of autologous artery coronary artery bypass graft(s) with unstable angina pectoris: Secondary | ICD-10-CM | POA: Diagnosis present

## 2023-10-27 DIAGNOSIS — Z7982 Long term (current) use of aspirin: Secondary | ICD-10-CM | POA: Insufficient documentation

## 2023-10-27 DIAGNOSIS — F17210 Nicotine dependence, cigarettes, uncomplicated: Secondary | ICD-10-CM | POA: Insufficient documentation

## 2023-10-27 DIAGNOSIS — E785 Hyperlipidemia, unspecified: Secondary | ICD-10-CM

## 2023-10-27 DIAGNOSIS — I214 Non-ST elevation (NSTEMI) myocardial infarction: Principal | ICD-10-CM

## 2023-10-27 DIAGNOSIS — Z794 Long term (current) use of insulin: Secondary | ICD-10-CM | POA: Insufficient documentation

## 2023-10-27 DIAGNOSIS — G4733 Obstructive sleep apnea (adult) (pediatric): Secondary | ICD-10-CM | POA: Diagnosis not present

## 2023-10-27 DIAGNOSIS — E1169 Type 2 diabetes mellitus with other specified complication: Secondary | ICD-10-CM | POA: Diagnosis present

## 2023-10-27 HISTORY — PX: CORONARY STENT INTERVENTION: CATH118234

## 2023-10-27 HISTORY — PX: LEFT HEART CATH AND CORONARY ANGIOGRAPHY: CATH118249

## 2023-10-27 LAB — CBC WITH DIFFERENTIAL/PLATELET
Abs Immature Granulocytes: 0.02 K/uL (ref 0.00–0.07)
Basophils Absolute: 0 K/uL (ref 0.0–0.1)
Basophils Relative: 0 %
Eosinophils Absolute: 0.1 K/uL (ref 0.0–0.5)
Eosinophils Relative: 2 %
HCT: 45.7 % (ref 39.0–52.0)
Hemoglobin: 15.5 g/dL (ref 13.0–17.0)
Immature Granulocytes: 0 %
Lymphocytes Relative: 33 %
Lymphs Abs: 2.3 K/uL (ref 0.7–4.0)
MCH: 32 pg (ref 26.0–34.0)
MCHC: 33.9 g/dL (ref 30.0–36.0)
MCV: 94.4 fL (ref 80.0–100.0)
Monocytes Absolute: 0.8 K/uL (ref 0.1–1.0)
Monocytes Relative: 12 %
Neutro Abs: 3.8 K/uL (ref 1.7–7.7)
Neutrophils Relative %: 53 %
Platelets: 173 K/uL (ref 150–400)
RBC: 4.84 MIL/uL (ref 4.22–5.81)
RDW: 12.1 % (ref 11.5–15.5)
WBC: 7.1 K/uL (ref 4.0–10.5)
nRBC: 0 % (ref 0.0–0.2)

## 2023-10-27 LAB — GLUCOSE, CAPILLARY
Glucose-Capillary: 79 mg/dL (ref 70–99)
Glucose-Capillary: 93 mg/dL (ref 70–99)

## 2023-10-27 LAB — HEPARIN LEVEL (UNFRACTIONATED): Heparin Unfractionated: 0.29 [IU]/mL — ABNORMAL LOW (ref 0.30–0.70)

## 2023-10-27 LAB — BASIC METABOLIC PANEL WITH GFR
Anion gap: 10 (ref 5–15)
BUN: 11 mg/dL (ref 6–20)
CO2: 25 mmol/L (ref 22–32)
Calcium: 9.1 mg/dL (ref 8.9–10.3)
Chloride: 104 mmol/L (ref 98–111)
Creatinine, Ser: 1.03 mg/dL (ref 0.61–1.24)
GFR, Estimated: >60 mL/min (ref >60)
Glucose, Bld: 131 mg/dL — ABNORMAL HIGH (ref 70–99)
Potassium: 4.3 mmol/L (ref 3.5–5.1)
Sodium: 139 mmol/L (ref 135–145)

## 2023-10-27 LAB — CBG MONITORING, ED: Glucose-Capillary: 83 mg/dL (ref 70–99)

## 2023-10-27 LAB — TROPONIN I (HIGH SENSITIVITY)
Troponin I (High Sensitivity): 7 ng/L (ref <18)
Troponin I (High Sensitivity): 74 ng/L — ABNORMAL HIGH (ref <18)
Troponin I (High Sensitivity): 191 ng/L (ref <18)
Troponin I (High Sensitivity): 242 ng/L (ref <18)

## 2023-10-27 LAB — POCT ACTIVATED CLOTTING TIME
Activated Clotting Time: 579 s
Activated Clotting Time: 809 s

## 2023-10-27 SURGERY — LEFT HEART CATH AND CORONARY ANGIOGRAPHY
Anesthesia: LOCAL

## 2023-10-27 MED ORDER — LISINOPRIL 5 MG PO TABS
5.0000 mg | ORAL_TABLET | Freq: Every day | ORAL | Status: DC
  Administered 2023-10-28: 5 mg via ORAL
  Filled 2023-10-27: qty 1

## 2023-10-27 MED ORDER — ASPIRIN 81 MG PO CHEW: 81.0000 mg | CHEWABLE_TABLET | ORAL | Status: DC

## 2023-10-27 MED ORDER — HEPARIN BOLUS VIA INFUSION
4000.0000 [IU] | Freq: Once | INTRAVENOUS | Status: AC
  Administered 2023-10-27: 4000 [IU] via INTRAVENOUS
  Filled 2023-10-27: qty 4000

## 2023-10-27 MED ORDER — SODIUM CHLORIDE 0.9% FLUSH
3.0000 mL | Freq: Two times a day (BID) | INTRAVENOUS | Status: DC
  Administered 2023-10-28 (×2): 3 mL via INTRAVENOUS

## 2023-10-27 MED ORDER — FENTANYL CITRATE (PF) 100 MCG/2ML IJ SOLN
INTRAMUSCULAR | Status: AC
  Filled 2023-10-27: qty 2

## 2023-10-27 MED ORDER — PANTOPRAZOLE SODIUM 40 MG PO TBEC
80.0000 mg | DELAYED_RELEASE_TABLET | Freq: Every day | ORAL | Status: DC
  Administered 2023-10-28: 80 mg via ORAL
  Filled 2023-10-27: qty 2

## 2023-10-27 MED ORDER — FREE WATER
500.0000 mL | Freq: Once | Status: AC
  Administered 2023-10-27: 500 mL via ORAL

## 2023-10-27 MED ORDER — VERAPAMIL HCL 2.5 MG/ML IV SOLN
INTRAVENOUS | Status: DC | PRN
  Administered 2023-10-27: 10 mL via INTRA_ARTERIAL

## 2023-10-27 MED ORDER — CLOPIDOGREL BISULFATE 300 MG PO TABS
ORAL_TABLET | ORAL | Status: DC | PRN
  Administered 2023-10-27: 600 mg via ORAL

## 2023-10-27 MED ORDER — ATORVASTATIN CALCIUM 80 MG PO TABS
80.0000 mg | ORAL_TABLET | Freq: Every day | ORAL | Status: DC
  Administered 2023-10-28: 80 mg via ORAL
  Filled 2023-10-27: qty 1

## 2023-10-27 MED ORDER — SODIUM CHLORIDE 0.9% FLUSH
3.0000 mL | INTRAVENOUS | Status: DC | PRN
Start: 2023-10-27 — End: 2023-10-28

## 2023-10-27 MED ORDER — CLOPIDOGREL BISULFATE 300 MG PO TABS
ORAL_TABLET | ORAL | Status: AC
  Filled 2023-10-27: qty 2

## 2023-10-27 MED ORDER — EMPAGLIFLOZIN 10 MG PO TABS
10.0000 mg | ORAL_TABLET | Freq: Every day | ORAL | Status: DC
  Administered 2023-10-28: 10 mg via ORAL
  Filled 2023-10-27: qty 1

## 2023-10-27 MED ORDER — HEPARIN (PORCINE) IN NACL 1000-0.9 UT/500ML-% IV SOLN
INTRAVENOUS | Status: DC | PRN
  Administered 2023-10-27 (×2): 500 mL

## 2023-10-27 MED ORDER — CLOPIDOGREL BISULFATE 75 MG PO TABS
75.0000 mg | ORAL_TABLET | Freq: Every day | ORAL | Status: DC
  Administered 2023-10-28: 75 mg via ORAL
  Filled 2023-10-27: qty 1

## 2023-10-27 MED ORDER — MIDAZOLAM HCL 2 MG/2ML IJ SOLN
INTRAMUSCULAR | Status: DC | PRN
  Administered 2023-10-27: 2 mg via INTRAVENOUS

## 2023-10-27 MED ORDER — AMLODIPINE BESYLATE 2.5 MG PO TABS
2.5000 mg | ORAL_TABLET | Freq: Every day | ORAL | Status: DC
  Administered 2023-10-28: 2.5 mg via ORAL
  Filled 2023-10-27: qty 1

## 2023-10-27 MED ORDER — NITROGLYCERIN 0.4 MG SL SUBL: 0.4000 mg | SUBLINGUAL_TABLET | SUBLINGUAL | Status: DC | PRN

## 2023-10-27 MED ORDER — ENOXAPARIN SODIUM 40 MG/0.4ML IJ SOSY
40.0000 mg | PREFILLED_SYRINGE | INTRAMUSCULAR | Status: DC
  Administered 2023-10-28: 40 mg via SUBCUTANEOUS
  Filled 2023-10-27: qty 0.4

## 2023-10-27 MED ORDER — FAMOTIDINE IN NACL 20-0.9 MG/50ML-% IV SOLN
INTRAVENOUS | Status: AC | PRN
  Administered 2023-10-27: 20 mg via INTRAVENOUS

## 2023-10-27 MED ORDER — HEPARIN SODIUM (PORCINE) 1000 UNIT/ML IJ SOLN
INTRAMUSCULAR | Status: AC
  Filled 2023-10-27: qty 10

## 2023-10-27 MED ORDER — ASPIRIN 81 MG PO TBEC: 81.0000 mg | DELAYED_RELEASE_TABLET | Freq: Every day | ORAL | Status: DC

## 2023-10-27 MED ORDER — ASPIRIN 81 MG PO TBEC
81.0000 mg | DELAYED_RELEASE_TABLET | Freq: Every day | ORAL | Status: DC
  Administered 2023-10-28: 81 mg via ORAL
  Filled 2023-10-27: qty 1

## 2023-10-27 MED ORDER — LIDOCAINE HCL (PF) 1 % IJ SOLN
INTRAMUSCULAR | Status: AC
  Filled 2023-10-27: qty 30

## 2023-10-27 MED ORDER — NICOTINE 21 MG/24HR TD PT24
21.0000 mg | MEDICATED_PATCH | Freq: Every day | TRANSDERMAL | Status: DC
  Filled 2023-10-27 (×2): qty 1

## 2023-10-27 MED ORDER — LIDOCAINE HCL (PF) 1 % IJ SOLN
INTRAMUSCULAR | Status: DC | PRN
  Administered 2023-10-27: 2 mL via INTRADERMAL

## 2023-10-27 MED ORDER — SODIUM CHLORIDE 0.9 % IV SOLN: 250.0000 mL | INTRAVENOUS | Status: DC | PRN

## 2023-10-27 MED ORDER — INSULIN ASPART 100 UNIT/ML IJ SOLN: 0.0000 [IU] | Freq: Three times a day (TID) | INTRAMUSCULAR | Status: DC

## 2023-10-27 MED ORDER — MIDAZOLAM HCL 2 MG/2ML IJ SOLN
INTRAMUSCULAR | Status: AC
  Filled 2023-10-27: qty 2

## 2023-10-27 MED ORDER — HEPARIN (PORCINE) 25000 UT/250ML-% IV SOLN
1400.0000 [IU]/h | INTRAVENOUS | Status: DC
  Administered 2023-10-27: 1400 [IU]/h via INTRAVENOUS
  Filled 2023-10-27: qty 250

## 2023-10-27 MED ORDER — ONDANSETRON HCL 4 MG/2ML IJ SOLN: 4.0000 mg | Freq: Four times a day (QID) | INTRAMUSCULAR | Status: DC | PRN

## 2023-10-27 MED ORDER — IOHEXOL 350 MG/ML SOLN
INTRAVENOUS | Status: DC | PRN
  Administered 2023-10-27: 125 mL

## 2023-10-27 MED ORDER — ACETAMINOPHEN 325 MG PO TABS: 650.0000 mg | ORAL_TABLET | ORAL | Status: DC | PRN

## 2023-10-27 MED ORDER — FENTANYL CITRATE (PF) 100 MCG/2ML IJ SOLN
INTRAMUSCULAR | Status: DC | PRN
  Administered 2023-10-27: 25 ug via INTRAVENOUS

## 2023-10-27 MED ORDER — HEPARIN SODIUM (PORCINE) 1000 UNIT/ML IJ SOLN
INTRAMUSCULAR | Status: DC | PRN
  Administered 2023-10-27 (×2): 5500 [IU] via INTRAVENOUS

## 2023-10-27 MED ORDER — FAMOTIDINE IN NACL 20-0.9 MG/50ML-% IV SOLN
INTRAVENOUS | Status: AC
  Filled 2023-10-27: qty 50

## 2023-10-27 MED ORDER — METOPROLOL SUCCINATE ER 50 MG PO TB24
50.0000 mg | ORAL_TABLET | Freq: Every day | ORAL | Status: DC
  Administered 2023-10-28: 50 mg via ORAL
  Filled 2023-10-27: qty 1

## 2023-10-27 MED ORDER — VERAPAMIL HCL 2.5 MG/ML IV SOLN
INTRAVENOUS | Status: AC
  Filled 2023-10-27: qty 2

## 2023-10-27 SURGICAL SUPPLY — 15 items
BALLOON EMERGE MR 2.5X15 (BALLOONS) IMPLANT
BALLOON ~~LOC~~ EMERGE MR 3.25X15 (BALLOONS) IMPLANT
CATH INFINITI 5 FR JL3.5 (CATHETERS) IMPLANT
CATH INFINITI JR4 5F (CATHETERS) IMPLANT
CATH VISTA GUIDE 6FR XBLD 3.5 (CATHETERS) IMPLANT
DEVICE RAD COMP TR BAND LRG (VASCULAR PRODUCTS) IMPLANT
GLIDESHEATH SLEND SS 6F .021 (SHEATH) IMPLANT
GUIDEWIRE INQWIRE 1.5J.035X260 (WIRE) IMPLANT
KIT ENCORE 26 ADVANTAGE (KITS) IMPLANT
PACK CARDIAC CATHETERIZATION (CUSTOM PROCEDURE TRAY) ×1 IMPLANT
SET ATX-X65L (MISCELLANEOUS) IMPLANT
SHEATH PROBE COVER 6X72 (BAG) IMPLANT
STENT SYNERGY XD 3.0X20 (Permanent Stent) IMPLANT
TUBING CIL FLEX 10 FLL-RA (TUBING) IMPLANT
WIRE ASAHI PROWATER 180CM (WIRE) IMPLANT

## 2023-10-27 NOTE — ED Provider Notes (Signed)
 [place] EMERGENCY DEPARTMENT AT [facility] Provider Note   CSN: [id number] Arrival date & time: 10/27/23  0340     Patient presents with: Chest Pain   Carl [name] is a 48 y.o. male.   Patient presents to the emergency department for evaluation of chest pain.  Patient reports onset of chest pain approximately 2 hours before coming to the ED.  He does have a history of heart disease and MI.  Patient given aspirin  during transport.  Since arriving in the ED, patient reports that pain has resolved, no further pain.       Prior to Admission medications   Medication Sig Start Date End Date Taking? Authorizing Provider  amLODipine  (NORVASC ) 2.5 MG tablet TAKE 1 TABLET BY MOUTH DAILY 09/14/23   Alvan Dorn FALCON, MD  Ascorbic Acid (VITAMIN C) 1000 MG tablet Take 2,000 mg by mouth daily.    [provider]  aspirin  EC 81 MG tablet Take 81 mg by mouth daily.     [provider]  atorvastatin  (LIPITOR ) 80 MG tablet Take 1 tablet (80 mg total) by mouth daily. 09/14/23   Alvan Dorn FALCON, MD  b complex vitamins capsule Take 1 capsule by mouth daily.    [provider]  empagliflozin  (JARDIANCE ) 10 MG TABS tablet TAKE 1 TABLET DAILY BEFORE BREAKFAST 09/23/23   Dettinger, Fonda LABOR, MD  lisinopril  (ZESTRIL ) 5 MG tablet Take 1 tablet (5 mg total) by mouth daily. 08/23/23   Dettinger, Fonda LABOR, MD  metoprolol  succinate (TOPROL -XL) 50 MG 24 hr tablet TAKE 1 TABLET BY MOUTH EVERY DAY with OR immediately AFTER a meal 08/12/23   Branch, Dorn FALCON, MD  nitroGLYCERIN  (NITROSTAT ) 0.4 MG SL tablet Place 1 tablet (0.4 mg total) under the tongue every 5 (five) minutes x 3 doses as needed for chest pain (if no relief after 2nd dose, proceed to ED or call 911). 07/29/21 05/27/88  Alvan Dorn FALCON, MD  omeprazole  (PRILOSEC) 40 MG capsule Take 1 capsule (40 mg total) by mouth daily. 12/31/22   Dettinger, Fonda LABOR, MD  tirzepatide  (MOUNJARO ) 7.5 MG/0.5ML Pen Inject 7.5 mg into  the skin once a week. 04/08/23   Dettinger, Fonda LABOR, MD    Allergies: Patient has no known allergies.    Review of Systems  Updated Vital Signs BP 123/80 (BP Location: Right Arm)   Pulse 83   Temp 98.4 F (36.9 C) (Oral)   Resp 20   Ht 5' 10 (1.778 m)   Wt 112 kg   SpO2 100%   BMI 35.44 kg/m   Physical Exam Vitals and nursing note reviewed.  Constitutional:      General: He is not in acute distress.    Appearance: He is well-developed.  HENT:     Head: Normocephalic and atraumatic.     Mouth/Throat:     Mouth: Mucous membranes are moist.  Eyes:     General: Vision grossly intact. Gaze aligned appropriately.     Extraocular Movements: Extraocular movements intact.     Conjunctiva/sclera: Conjunctivae normal.  Cardiovascular:     Rate and Rhythm: Normal rate and regular rhythm.     Pulses: Normal pulses.     Heart sounds: Normal heart sounds, S1 normal and S2 normal. No murmur heard.    No friction rub. No gallop.  Pulmonary:     Effort: Pulmonary effort is normal. No respiratory distress.     Breath sounds: Normal breath sounds.  Abdominal:  Palpations: Abdomen is soft.     Tenderness: There is no abdominal tenderness. There is no guarding or rebound.     Hernia: No hernia is present.  Musculoskeletal:        General: No swelling.     Cervical back: Full passive range of motion without pain, normal range of motion and neck supple. No pain with movement, spinous process tenderness or muscular tenderness. Normal range of motion.     Right lower leg: No edema.     Left lower leg: No edema.  Skin:    General: Skin is warm and dry.     Capillary Refill: Capillary refill takes less than 2 seconds.     Findings: No ecchymosis, erythema, lesion or wound.  Neurological:     Mental Status: He is alert and oriented to person, place, and time.     GCS: GCS eye subscore is 4. GCS verbal subscore is 5. GCS motor subscore is 6.     Cranial Nerves: Cranial nerves 2-12 are  intact.     Sensory: Sensation is intact.     Motor: Motor function is intact. No weakness or abnormal muscle tone.     Coordination: Coordination is intact.  Psychiatric:        Mood and Affect: Mood normal.        Speech: Speech normal.        Behavior: Behavior normal.     (all labs ordered are listed, but only abnormal results are displayed) Labs Reviewed  CBC WITH DIFFERENTIAL/PLATELET  BASIC METABOLIC PANEL WITH GFR  TROPONIN I (HIGH SENSITIVITY)    EKG: EKG Interpretation Date/Time:  Wednesday October 27 2023 03:51:12 EDT Ventricular Rate:  76 PR Interval:  178 QRS Duration:  104 QT Interval:  388 QTC Calculation: 437 R Axis:   49  Text Interpretation: Sinus rhythm Low voltage, precordial leads No significant change since last tracing Confirmed by Haze Lonni PARAS [id number]) on 10/27/2023 3:57:05 AM  Radiology: No results found.   Procedures   Medications Ordered in the ED - No data to display  Clinical Course as of 11/03/23 0522  Wed Oct 27, 2023  0728 Initial troponin was 7 uptrending to 74 with delta troponin.  Paged cardiology.  Will order heparin  bolus and drip for NSTEMI [MP]  [phone] Discussed with cardiology who will evaluate patient in ED [MP]  1134 3rd and 4th troponins continuously uptrending.  Patient chest pain-free.  Dr. Anner (cardiology) and cardiology team have spoken with the patient.  He was at first resistant to cardiac catheterization however is now amenable to plan for cath and admission.  He remains hemodynamically stable here still on heparin  drip.  He will be admitted to cardiology service. [MP]    Clinical Course User Index [MP] Pamella Ozell LABOR, DO                                 Medical Decision Making Amount and/or Complexity of Data Reviewed External Data Reviewed: labs, ECG and notes. Labs: ordered. Decision-making details documented in ED Course. Radiology: ordered and independent interpretation performed. Decision-making  details documented in ED Course. ECG/medicine tests: ordered and independent interpretation performed. Decision-making details documented in ED Course.  Risk Decision regarding hospitalization.   Differential Diagnosis considered includes, but not limited to: STEMI; NSTEMI; myocarditis; pericarditis; pulmonary embolism; aortic dissection; pneumothorax; pneumonia; gastritis; musculoskeletal pain  Presents to the emergency department for evaluation of  chest pain.  Patient has a history of MI with stenting previously.  Patient had pain for approximately 2 hours, pain resolved at time of arrival to the ED.  Record review reveals heart catheterization June 02, 2023: Impression and recommendations: The stress test is positive due to a very large D2 that is chronically occluded but is filled faintly by collaterals.  As the symptoms are class I at most 2, EF is fairly preserved, the vessel being small and diffusely diseased, no advantage in trying to open up CTO D2, medical therapy.  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  Signed to oncoming ER physician with second troponin pending.    Final diagnoses:  None    ED Discharge Orders     None          Eloni Darius, Lonni PARAS, MD 11/03/23 [phone]

## 2023-10-27 NOTE — ED Notes (Signed)
 EDP at bedside

## 2023-10-27 NOTE — ED Provider Notes (Signed)
  Physical Exam  BP 117/72   Pulse 66   Temp 98.6 F (37 C)   Resp 19   Ht 5' 10 (1.778 m)   Wt 112 kg   SpO2 98%   BMI 35.44 kg/m   Physical Exam Vitals and nursing note reviewed.  HENT:     Head: Normocephalic and atraumatic.  Eyes:     Pupils: Pupils are equal, round, and reactive to light.  Cardiovascular:     Rate and Rhythm: Normal rate and regular rhythm.  Pulmonary:     Effort: Pulmonary effort is normal.     Breath sounds: Normal breath sounds.  Abdominal:     Palpations: Abdomen is soft.     Tenderness: There is no abdominal tenderness.  Skin:    General: Skin is warm and dry.  Neurological:     Mental Status: He is alert.  Psychiatric:        Mood and Affect: Mood normal.     Procedures  .Critical Care  Performed by: Pamella Ozell LABOR, DO Authorized by: Pamella Ozell LABOR, DO   Critical care provider statement:    Critical care time (minutes):  30   Critical care was necessary to treat or prevent imminent or life-threatening deterioration of the following conditions:  Cardiac failure   Critical care was time spent personally by me on the following activities:  Development of treatment plan with patient or surrogate, discussions with consultants, evaluation of patient's response to treatment, examination of patient, ordering and review of laboratory studies, ordering and review of radiographic studies, ordering and performing treatments and interventions, pulse oximetry, re-evaluation of patient's condition and review of old charts   I assumed direction of critical care for this patient from another provider in my specialty: no     Care discussed with: admitting provider   Comments:     Discussed with cardiology team   ED Course / MDM   Clinical Course as of 10/27/23 1147  Wed Oct 27, 2023  0728 Initial troponin was 7 uptrending to 74 with delta troponin.  Paged cardiology.  Will order heparin  bolus and drip for NSTEMI [MP]  [phone] Discussed with  cardiology who will evaluate patient in ED [MP]  1134 3rd and 4th troponins continuously uptrending.  Patient chest pain-free.  Dr. Anner (cardiology) and cardiology team have spoken with the patient.  He was at first resistant to cardiac catheterization however is now amenable to plan for cath and admission.  He remains hemodynamically stable here still on heparin  drip.  He will be admitted to cardiology service. [MP]    Clinical Course User Index [MP] Pamella Ozell LABOR, DO   Medical Decision Making I, Ozell Pamella DO, have assumed care of this patient from the previous provider pending delta troponin reevaluation and disposition  Amount and/or Complexity of Data Reviewed Labs: ordered. Radiology: ordered.  Risk Prescription drug management. Decision regarding hospitalization.          Pamella Ozell LABOR, DO 10/27/23 1147

## 2023-10-27 NOTE — ED Notes (Signed)
 Ccmd called

## 2023-10-27 NOTE — Interval H&P Note (Signed)
 History and Physical Interval Note:  10/27/2023 2:51 PM  Carl [name]  has presented today for surgery, with the diagnosis of NSTEMI.  The various methods of treatment have been discussed with the patient and family. After consideration of risks, benefits and other options for treatment, the patient has consented to  Procedure(s): LEFT HEART CATH AND CORONARY ANGIOGRAPHY (N/A) as a surgical intervention.  The patient's history has been reviewed, patient examined, no change in status, stable for surgery.  I have reviewed the patient's chart and labs.  Questions were answered to the patient's satisfaction.   Cath Lab Visit (complete for each Cath Lab visit)  Clinical Evaluation Leading to the Procedure:   ACS: Yes.    Non-ACS:    Anginal Classification: CCS III  Anti-ischemic medical therapy: Maximal Therapy (2 or more classes of medications)  Non-Invasive Test Results: No non-invasive testing performed  Prior CABG: No previous CABG        [facility] 10/27/2023 2:52 PM

## 2023-10-27 NOTE — ED Notes (Signed)
 Pt ambulated to restroom without assistance.

## 2023-10-27 NOTE — H&P (Addendum)
 Cardiology Admission History and Physical  Patient ID: RANULFO KALL MRN: [id number]; DOB: Dec 24, 1975   Admission date: 10/27/2023  PCP:  Dettinger, Fonda LABOR, MD   [facility] Providers Cardiologist:  Alvan Carrier, MD      Chief Complaint:  chest pain  Patient Profile: SLOAN TAKAGI is a 48 y.o. male with CAD s/p PCI to LAD with DES x 2, recent cath 05/2023 showed occluded D2 with collaterals, recommended medical management, chronic stable angina, hyperlipidemia, hypertension, type 2 diabetes, tobacco abuse, OSA, GERD, anxiety, depression who is being seen 10/27/2023 for the evaluation of chest pain.  History of Present Illness: Mr. Portela has past medical history as stated above. He presented to [facility] ED via EMS on 10/27/2023 for chest pain that occurred ~ 2 hours prior to presentation.  He tells me that this chest pain came on while he was driving to work this morning, not exertional.  He denies any recent exertional chest pain or shortness of breath.  He denies any additional symptoms, no syncope, presyncope, N/V, diaphoresis.  He tells me that he felt relief of his chest pain when given the 325 mg of aspirin  from EMS.  He tells me he has had no recurrent chest pain since arrival.  He admits to still smoking a significant amount, while he has reduced his amount of cigarettes per day he has taken out more in terms of vaping.  He denies any issues with medication compliance, no missed doses. Thru chart review, it appears that he was on DAPT after his PCI in 2019 but self-discontinued his Plavix  in 2020 and then was instructed that it was okay to remain on ASA monotherapy.   After speaking with the patient, after recently undergoing a cardiac catheterization in April 2025 and with his job as a [employer], which complicates postprocedure return to work, he would prefer to forego any invasive evaluation.  He tells me that he is getting messages from work, about a leave of absence,  which he is worried could lead to financial difficulties.    He has an extensive history of CAD with CTO PCI of LAD in 2019. His most recent LHC was 05/2023 that showed patent stents to LAD, large, chronically occluded D2, opted for medical therapy.   Relevant workup while in the ED: troponin 7 ? 74 ? 191, BMP stable, CBC normal, CXR slight cardiomegaly, no active disease. EKG without acute ischemic changes. Patient reports resolution of chest pain with administration of ASA per EMS.   Patient is currently resting comfortably in bed with no more chest discomfort.  He described the symptom as being a pressure from between his shoulder blades and pushed forward into his chest.  He does not recall the symptoms he had prior to his CTO PCI.  For the most part he has been pretty compliant as far as treating his lipids and diabetes along with blood pressure.  However he continues to smoke which is the 1 major risk factor that he still has.  He has a significant Mehta stent in the LAD and is only on aspirin .  He now presents with chest pain and troponin that is gone into the 190 range which would be suggestive of at least an ACS presentation.  Past Medical History:  Diagnosis Date   Anxiety    CAD in native artery    a. cath in 2015 [facility] showing occluded proximal LAD, LAD filled with left-left and right-left collaterals, otherwise 20-30% prox-mid RCA,  LVEF 55%. b. Abnormal nuc 02/2016 - [facility] medically. c. redo cath in 06/2017 showing 100% Prox LAD stenosis, 50% RCA, and 40% RPDA and underwent CTO PCI of the LAD with DESx2 to in 08/2017   Daily headache    since I started Plavix  (08/25/2017)   Depression    Diabetes mellitus type 2 in obese    Hyperlipidemia    Hypertension    OSA on CPAP    Past Surgical History:  Procedure Laterality Date   CORONARY CTO INTERVENTION N/A 08/25/2017   Procedure: CORONARY CTO INTERVENTION;  Surgeon: Swaziland, Peter M, MD;  [facility]: [facility] INVASIVE CV LAB;  Service:  Cardiovascular;  Laterality: N/A;   LEFT HEART CATH AND CORONARY ANGIOGRAPHY N/A 07/21/2017   Procedure: LEFT HEART CATH AND CORONARY ANGIOGRAPHY;  Surgeon: Claudene Victory ORN, MD;  Location: [facility] INVASIVE CV LAB;  Service: Cardiovascular;  Laterality: N/A;   LEFT HEART CATH AND CORONARY ANGIOGRAPHY N/A 06/02/2023   Procedure: LEFT HEART CATH AND CORONARY ANGIOGRAPHY;  Surgeon: Ladona Heinz, MD;  Location: [facility] INVASIVE CV LAB;  Service: Cardiovascular;  Laterality: N/A;   MOLE REMOVAL Right    chin   TUMOR EXCISION Left 1991   knee area    Medications Prior to Admission: Prior to Admission medications   Medication Sig Start Date End Date Taking? Authorizing Provider  amLODipine  (NORVASC ) 2.5 MG tablet TAKE 1 TABLET BY MOUTH DAILY 09/14/23   Alvan Dorn FALCON, MD  Ascorbic Acid (VITAMIN C) 1000 MG tablet Take 2,000 mg by mouth daily.    [provider]  aspirin  EC 81 MG tablet Take 81 mg by mouth daily.     [provider]  atorvastatin  (LIPITOR ) 80 MG tablet Take 1 tablet (80 mg total) by mouth daily. 09/14/23   Alvan Dorn FALCON, MD  b complex vitamins capsule Take 1 capsule by mouth daily.    [provider]  empagliflozin  (JARDIANCE ) 10 MG TABS tablet TAKE 1 TABLET DAILY BEFORE BREAKFAST 09/23/23   Dettinger, Fonda LABOR, MD  lisinopril  (ZESTRIL ) 5 MG tablet Take 1 tablet (5 mg total) by mouth daily. 08/23/23   Dettinger, Fonda LABOR, MD  metoprolol  succinate (TOPROL -XL) 50 MG 24 hr tablet TAKE 1 TABLET BY MOUTH EVERY DAY with OR immediately AFTER a meal 08/12/23   Branch, Dorn FALCON, MD  nitroGLYCERIN  (NITROSTAT ) 0.4 MG SL tablet Place 1 tablet (0.4 mg total) under the tongue every 5 (five) minutes x 3 doses as needed for chest pain (if no relief after 2nd dose, proceed to ED or call 911). 07/29/21 05/27/88  Alvan Dorn FALCON, MD  omeprazole  (PRILOSEC) 40 MG capsule Take 1 capsule (40 mg total) by mouth daily. 12/31/22   Dettinger, Fonda LABOR, MD  tirzepatide  (MOUNJARO ) 7.5 MG/0.5ML Pen  Inject 7.5 mg into the skin once a week. 04/08/23   Dettinger, Fonda LABOR, MD    Allergies:   No Known Allergies  Social History:   Social History   Socioeconomic History   Marital status: Divorced    Spouse name: Not on file   Number of children: Not on file   Years of education: Not on file   Highest education level: Not on file  Occupational History   Not on file  Tobacco Use   Smoking status: Every Day    Current packs/day: 1.00    Average packs/day: 1 pack/day for 30.8 years (30.8 ttl pk-yrs)    Types: Cigarettes    Start date: 01/03/1993   Smokeless tobacco: Never  Vaping Use   Vaping status: Every Day   Substances: Nicotine , Flavoring  Substance and Sexual Activity   Alcohol use: Yes    Comment: 08/25/2017 0-a few drinks/year   Drug use: Never   Sexual activity: Yes  Other Topics Concern   Not on file  Social History Narrative   Not on file   Social Drivers of Health   Financial Resource Strain: Not on file  Food Insecurity: Not on file  Transportation Needs: Not on file  Physical Activity: Not on file  Stress: Not on file  Social Connections: Unknown (06/29/2021)   [facility] from [facility]   Social Network    Social Network: Not on file  Intimate Partner Violence: Unknown (05/27/2021)   Received from [facility]   HITS    Physically Hurt: Not on file    Insult or Talk Down To: Not on file    Threaten Physical Harm: Not on file    Scream or Curse: Not on file    Family History:   The patient's family history includes Cancer in his mother; Diabetes in his father and mother.    ROS:  Please see the history of present illness.  All other ROS reviewed and negative.     Physical Exam/Data: Vitals:   10/27/23 0845 10/27/23 0930 10/27/23 1045 10/27/23 1100  BP: 103/67 112/66 116/75 117/72  Pulse: 64 74 69 66  Resp: 18 18 (!) 21 19  Temp:  98.6 F (37 C)    TempSrc:      SpO2: 99% 98% 95% 98%  Weight:      Height:       No intake or output data  in the 24 hours ending 10/27/23 1134    10/27/2023    3:43 AM 10/14/2023   10:27 AM 09/02/2023    1:37 PM  Last 3 Weights  Weight (lbs) 247 lb 249 lb 248 lb 12.8 oz  Weight (kg) 112.038 kg 112.946 kg 112.855 kg     Body mass index is 35.44 kg/m.   General:  Well nourished, well developed, in no acute distress HEENT: normal Neck: no JVD Vascular: No carotid bruits; Distal pulses 2+ bilaterally   Cardiac:  normal S1, S2; RRR; no murmur  Lungs:  clear to auscultation bilaterally, no wheezing, rhonchi or rales  Abd: soft, nontender, no hepatomegaly  Ext: no edema Musculoskeletal:  No deformities, BUE and BLE strength normal and equal Skin: warm and dry  Neuro:  CNs 2-12 intact, no focal abnormalities noted Psych:  Normal affect   EKG:  The ECG that was done 10/27/2023 was personally reviewed and demonstrates normal sinus rhythm  Relevant CV Studies:  Left heart cath, 06/02/2023 Hemodynamic data: LVEDP 24 mmHg, moderately elevated.  No pressure gradient across the aortic valve.   Angiographic data: LM: Large-caliber vessel, mildly calcified.  No luminal irregularity. LAD: LAD is mild to moderately diffusely diseased.  Proximal LAD has a new 40% stenosis, previously placed 2.5 x 38 and 2.5 x 16 mm Synergy XD DES in the mid LAD are widely patent with minimal restenosis.  There is a large D2 that is occluded in the proximal segment and fills faintly with collaterals. LCx: Large-caliber vessel giving origin to a large OM 2 and a moderate-sized OM 3, mid segment after OM1 has a new focal 40% stenosis. RCA: Large-caliber vessel nondominant vessel.  Proximal segment has a 50% stenosis.  There is mild disease distally.  Mostly anatomy is unchanged.  Impression and recommendations: The stress test is positive due to a very large D2 that is chronically occluded but is filled faintly by collaterals.  As the symptoms are class I at most 2, EF is fairly preserved, the vessel being small and  diffusely diseased, no advantage in trying to open up CTO D2, medical therapy.  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  Nuclear stress, 04/29/2023   Exercised for 6 minutes and 54 seconds, per Bruce protocol, achieving 9.70 METS. Normal exercise capacity. Normal BP and normal HR response. No angina during the test. Stress ECG is uninterpretable due to artifact but recovery ECG showed <1 mm ST depression in the inferior leads.   Stress ECG is negative for ischemia. Duke Treadmill score is +4, medium risk.   LV perfusion is abnormal. There is evidence of ischemia. There is no evidence of infarction. Defect 1: There is a small reversible perfusion defect in the mid inferior wall with normal wall motion in the defect area consistent with ischemia.   Left ventricular function is abnormal. Nuclear stress EF: 51%. Visually LVEF appears to be low normal. End diastolic cavity size is moderately enlarged. End systolic cavity size is mildly enlarged.   Transient Ischemic Dilation (TID) is 1.32, visual and quantitative. Marker for high risk coronary artery disease.   Findings are consistent with ischemia and no infarction. The study is high risk.  Laboratory Data: High Sensitivity Troponin:   Recent Labs  Lab 10/27/23 0429 10/27/23 0634 10/27/23 0838  TROPONINIHS 7 74* 191*      Chemistry Recent Labs  Lab 10/27/23 0429  NA 139  K 4.3  CL 104  CO2 25  GLUCOSE 131*  BUN 11  CREATININE 1.03  CALCIUM  9.1  GFRNONAA >60  ANIONGAP 10    No results for input(s): PROT, ALBUMIN, AST, ALT, ALKPHOS, BILITOT in the last 168 hours. Lipids No results for input(s): CHOL, TRIG, HDL, LABVLDL, LDLCALC, CHOLHDL in the last 168 hours. Hematology Recent Labs  Lab 10/27/23 0429  WBC 7.1  RBC 4.84  HGB 15.5  HCT 45.7  MCV 94.4  MCH 32.0  MCHC 33.9  RDW 12.1  PLT 173   Thyroid  No results for input(s): TSH, FREET4 in the last 168 hours. BNPNo results for input(s):  BNP, PROBNP in the last 168 hours.  DDimer No results for input(s): DDIMER in the last 168 hours.  Radiology/Studies:  DG Chest Port 1 View Result Date: 10/27/2023 CLINICAL DATA:  Chest pain. EXAM: PORTABLE CHEST 1 VIEW COMPARISON:  PA and lateral chest 12/19/2015 FINDINGS: The heart size and mediastinal contours are unchanged, with slight cardiomegaly. Both lungs are clear. The visualized skeletal structures are unremarkable. IMPRESSION: No active disease.  Stable chest with slight cardiomegaly. Electronically Signed   By: Francis Quam M.D.   On: 10/27/2023 05:32   Assessment and Plan:  NSTEMI CAD s/p CTO PCI to LAD with DES x 2 (2019 Chronically occluded D2 per [facility] 05/2023 Presented to ED via EMS with chest pain Troponin 7 ? 74 ? 191 Home meds: ASA 81 mg, Lipitor  80 mg, lisinopril  5 mg, Toprol  50 mg, Jardiance  10 mg  Previously on DAPT after PCI in 2019, but self-discontinued Plavix  in 2020 EKG without acute ischemic changes Given ASA 325 mg by EMS Started on IV heparin  No active chest pain, reports resolution with aspirin   No recent exertional chest pain, shortness of breath prior to today Currently in IV heparin   Plan for LHC today, pending results  may be able to d/c tomorrow  The patient feels much better now albeit on heparin .  No further chest discomfort.  He would like to go home to smoke and be with his cat .  He does not trust the medications in our formulary and also wants his CPAP.  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  The diagnosis of non-STEMI alone would indicate that a short hospitalization would be recommended which will already trigger the need for paperwork to be done with his work.  I think the most efficient way to evaluate this episode is whether cardiac catheterization to exclude any new CAD and if present proceed with PCI.  Hopefully if best case scenario he has a diagnostic  catheterization and potentially able to be discharged later on today.  If there were PCI would not expect that he probably would be here till tomorrow morning and be discharged in the morning.  This will allow us  to better answer questions about return to work.  He is agreeable to proceed with catheterization after long discussion.  Informed Consent   Shared Decision Making/Informed Consen The risks [stroke (1 in 1000), death (1 in 1000), kidney failure [usually temporary] (1 in 500), bleeding (1 in 200), allergic reaction [possibly serious] (1 in 200)], benefits (diagnostic support and management of coronary artery disease) and alternatives of a cardiac catheterization were discussed in detail with Mr. Pezzullo and he is willing to proceed.      Hypertension  Home meds: Amlodipine  2.5 mg daily, lisinopril  5 mg daily, Toprol  50 mg daily Reports good compliance with medications BP has been normal since in the emergency department Restart home meds, post-cath  Hyperlipidemia Home meds: Lipitor  80 mg daily 10/14/2023: ALT 37; HDL 34; LDL Chol Calc (NIH) 51  Continue statin   Diabetes  Home meds: Jardiance  10 mg daily, Mounjaro  once weekly SSI while inpatient   GERD Home meds: Prilosec 40 mg daily Consider switching to Protonix  if patient is going to require Plavix   Tobacco use Continues to smoke cigarettes and vapes  No intention on quitting  He understands the risks  Will need nicotine  patch while inpatient   OSA CPAP while inpatient   Risk Assessment/Risk Scores:   TIMI Risk Score for Unstable Angina or Non-ST Elevation MI:   The patient's TIMI risk score is 4, which indicates a 20% risk of all cause mortality, new or recurrent myocardial infarction or need for urgent revascularization in the next 14 days.   Code Status: Full Code  Severity of Illness: The appropriate patient status for this patient is OBSERVATION. Observation status is judged to be reasonable and necessary in  order to provide the required intensity of service to ensure the patient's safety. The patient's presenting symptoms, physical exam findings, and initial radiographic and laboratory data in the context of their medical condition is felt to place them at decreased risk for further clinical deterioration. Furthermore, it is anticipated that the patient will be medically stable for discharge from the hospital within 2 midnights of admission.    For questions or updates, please contact [facility] Please consult [email or web address] for contact info under     Signed, Waddell DELENA Donath, PA-C  10/27/2023 11:34 AM   ATTENDING ATTESTATION  I have seen, examined and evaluated the patient in the ER along with Waddell Donath, PA.  After reviewing all the available data and chart, we discussed  the patients laboratory, study & physical findings as well as symptoms in detail.  I agree with her findings, examination as well as impression recommendations as per our discussion.    Attending adjustments noted in italics.   See long discussion above about plans for cardiac catheterization. Plan for diagnostic catheterization today with PCI if indicated. Otherwise continue home medications Smoking cessation counseling offered and not excepted.  He does not want nicotine  patch as it will not help. Does have OSA and will need CPAP.  Follow-up for quick hospitalization, as he is agreeable to the shortest possible.    Alm MICAEL Clay, MD, MS Alm Clay, M.D., M.S. Interventional Cardiologist  [facility] Pager # [phone]

## 2023-10-27 NOTE — ED Notes (Signed)
 Phlebotomy asked to collect blood

## 2023-10-27 NOTE — Progress Notes (Signed)
 PHARMACY - ANTICOAGULATION CONSULT NOTE  Pharmacy Consult for Heparin  Indication: NSTEMI  No Known Allergies  Patient Measurements: Height: 5' 10 (177.8 cm) Weight: 112 kg (247 lb) IBW/kg (Calculated) : 73 HEPARIN  DW (KG): 97.5  Vital Signs: Temp: 98.7 F (37.1 C) (09/03 0500) Temp Source: Oral (09/03 0500) BP: 112/66 (09/03 0715) Pulse Rate: 79 (09/03 0715)  Labs: Recent Labs    10/27/23 0429 10/27/23 0634  HGB 15.5  --   HCT 45.7  --   PLT 173  --   CREATININE 1.03  --   TROPONINIHS 7 74*    Estimated Creatinine Clearance: 108.7 mL/min (by C-G formula based on SCr of 1.03 mg/dL).   Medical History: Past Medical History:  Diagnosis Date   Anxiety    CAD in native artery    a. cath in 2015 [facility] showing occluded proximal LAD, LAD filled with left-left and right-left collaterals, otherwise 20-30% prox-mid RCA, LVEF 55%. b. Abnormal nuc 02/2016 - [facility] medically. c. redo cath in 06/2017 showing 100% Prox LAD stenosis, 50% RCA, and 40% RPDA and underwent CTO PCI of the LAD with DESx2 to in 08/2017   Daily headache    since I started Plavix  (08/25/2017)   Depression    Diabetes mellitus type 2 in obese    Hyperlipidemia    Hypertension    OSA on CPAP     Medications:  See electronic med rec  Assessment: 48 y.o. M presents with CP/NSTEMI. To begin heparin . No AC PTA. CBC ok on admission.  Goal of Therapy:  Heparin  level 0.3-0.7 units/ml Monitor platelets by anticoagulation protocol: Yes   Plan:  Heparin  IV bolus 4000 units Heparin  gtt at 1400 units/hr Will f/u heparin  level in 6 hours Daily heparin  level and CBC  Vito Ralph, PharmD, BCPS Please see amion for complete clinical pharmacist phone list 10/27/2023,7:50 AM

## 2023-10-27 NOTE — ED Triage Notes (Signed)
 PT brought in by [facility] PT is A&OX4, PT states he has had chest pain for about 2 hours, has a history of a MI. PT was given 324 of ASA en route. PT VSS and states since the aspirin  he has no chest pain.

## 2023-10-28 ENCOUNTER — Other Ambulatory Visit: Payer: Self-pay | Admitting: Cardiology

## 2023-10-28 ENCOUNTER — Other Ambulatory Visit (HOSPITAL_COMMUNITY): Payer: Self-pay

## 2023-10-28 ENCOUNTER — Telehealth: Payer: Self-pay | Admitting: Cardiology

## 2023-10-28 DIAGNOSIS — Z720 Tobacco use: Secondary | ICD-10-CM

## 2023-10-28 DIAGNOSIS — I25720 Atherosclerosis of autologous artery coronary artery bypass graft(s) with unstable angina pectoris: Secondary | ICD-10-CM | POA: Diagnosis not present

## 2023-10-28 DIAGNOSIS — I25710 Atherosclerosis of autologous vein coronary artery bypass graft(s) with unstable angina pectoris: Secondary | ICD-10-CM | POA: Diagnosis not present

## 2023-10-28 DIAGNOSIS — I10 Essential (primary) hypertension: Secondary | ICD-10-CM | POA: Diagnosis not present

## 2023-10-28 DIAGNOSIS — I214 Non-ST elevation (NSTEMI) myocardial infarction: Secondary | ICD-10-CM

## 2023-10-28 DIAGNOSIS — G4733 Obstructive sleep apnea (adult) (pediatric): Secondary | ICD-10-CM

## 2023-10-28 DIAGNOSIS — E1159 Type 2 diabetes mellitus with other circulatory complications: Secondary | ICD-10-CM | POA: Diagnosis not present

## 2023-10-28 DIAGNOSIS — Z955 Presence of coronary angioplasty implant and graft: Secondary | ICD-10-CM

## 2023-10-28 LAB — BASIC METABOLIC PANEL WITH GFR
Anion gap: 12 (ref 5–15)
BUN: 13 mg/dL (ref 6–20)
CO2: 23 mmol/L (ref 22–32)
Calcium: 9.1 mg/dL (ref 8.9–10.3)
Chloride: 103 mmol/L (ref 98–111)
Creatinine, Ser: 1.07 mg/dL (ref 0.61–1.24)
GFR, Estimated: >60 mL/min (ref >60)
Glucose, Bld: 88 mg/dL (ref 70–99)
Potassium: 4.1 mmol/L (ref 3.5–5.1)
Sodium: 138 mmol/L (ref 135–145)

## 2023-10-28 LAB — CBC
HCT: 43.6 % (ref 39.0–52.0)
Hemoglobin: 15.1 g/dL (ref 13.0–17.0)
MCH: 31.9 pg (ref 26.0–34.0)
MCHC: 34.6 g/dL (ref 30.0–36.0)
MCV: 92.2 fL (ref 80.0–100.0)
Platelets: 166 K/uL (ref 150–400)
RBC: 4.73 MIL/uL (ref 4.22–5.81)
RDW: 12.2 % (ref 11.5–15.5)
WBC: 6.9 K/uL (ref 4.0–10.5)
nRBC: 0 % (ref 0.0–0.2)

## 2023-10-28 LAB — GLUCOSE, CAPILLARY: Glucose-Capillary: 100 mg/dL — ABNORMAL HIGH (ref 70–99)

## 2023-10-28 MED ORDER — PANTOPRAZOLE SODIUM 40 MG PO TBEC
40.0000 mg | DELAYED_RELEASE_TABLET | Freq: Every day | ORAL | 3 refills | Status: DC
  Filled 2023-10-28 – 2024-01-19 (×2): qty 90, 90d supply, fill #0

## 2023-10-28 MED ORDER — CLOPIDOGREL BISULFATE 75 MG PO TABS
75.0000 mg | ORAL_TABLET | Freq: Every day | ORAL | 3 refills | Status: AC
  Filled 2023-10-28 – 2024-01-19 (×2): qty 90, 90d supply, fill #0

## 2023-10-28 NOTE — Progress Notes (Signed)
Outpatient echo ordered.

## 2023-10-28 NOTE — Telephone Encounter (Signed)
 Called patient and made patient aware that he has appointment with Olivia Pavy on 9/16 @1 :15. Patient preferred appointment in [place] office. Made patient aware that there are no visit available in [place] office. Understanding verbalized

## 2023-10-28 NOTE — Subjective & Objective (Signed)
   Patient ID: NIC LAMPE MRN: [id number]; DOB: Dec 31, 1975    Admission date: 10/27/2023   PCP:  Dettinger, Fonda LABOR, MD              [facility] Providers Cardiologist:  Alvan Carrier, MD       Chief Complaint:  chest pain   Patient Profile: Carl [name] is a 48 y.o. male with CAD s/p PCI to LAD with DES x 2, recent cath 05/2023 showed occluded D2 with collaterals, recommended medical management, chronic stable angina, hyperlipidemia, hypertension, type 2 diabetes, tobacco abuse, OSA, GERD, anxiety, depression who is being seen 10/27/2023 for the evaluation of chest pain with troponin elevation of roughly 200 consistent with ACS/non-STEMI.  Plan was IV heparin  and cardiac catheterization to assess for possible stent stenosis.  Principal Problem:   NSTEMI (non-ST elevated myocardial infarction) (HCC) Active Problems:   Coronary artery disease involving autologous artery coronary bypass graft with unstable angina pectoris (HCC)   Presence of drug coated stent in LAD coronary artery   Type 2 diabetes mellitus with other specified complication (HCC)   Hyperlipidemia associated with type 2 diabetes mellitus (HCC)   Hypertension associated with diabetes (HCC)   OSA (obstructive sleep apnea)   Tobacco abuse   GERD (gastroesophageal reflux disease)   SUBJECTIVE He underwent cardiac catheterization yesterday revealing occluded LAD stent treated with a history of DES stent. Has done well with no major issues post cath.  TR band is stable.  No chest pain or pressure.  No dyspnea. Has walked around the room but not yet in the hallway.  BP 128/77 (BP Location: Left Arm)   Pulse 82   Temp 98.1 F (36.7 C) (Oral)   Resp 18   Ht 5' 10 (1.778 m)   Wt 112 kg   SpO2 95%   BMI 35.44 kg/m  General appearance: alert, cooperative, appears stated age, no distress, moderately obese, and otherwise healthy appearing Neck: no carotid bruit and no JVD Lungs: clear to auscultation  bilaterally, normal percussion bilaterally, and no obvious wheezes rales or rhonchi.  Nonlabored. Heart: regular rate and rhythm, S1, S2 normal, no murmur, click, rub or gallop and normal apical impulse Abdomen: soft, non-tender; bowel sounds normal; no masses,  no organomegaly and obese Extremities: extremities normal, atraumatic, no cyanosis or edema and right radial cath site clean dry intact.  No hematoma.  Dressing in place.  Reversed Barbeau A Pulses: 2+ and symmetric Neurologic: Alert and oriented X 3, normal strength and tone. Normal symmetric reflexes. Normal coordination and gait   Telemetry: Sinus rhythm EKG: Sinus rhythm with PVCs; rate 77 bpm.  Otherwise normal.  Cardiac Cath-PCI 10/27/2023

## 2023-10-28 NOTE — Assessment & Plan Note (Signed)
 BP well-controlled on combination lisinopril , Toprol  and lisinopril  at home dose.

## 2023-10-28 NOTE — Progress Notes (Signed)
 Subjective/Objective   Patient ID: DMARI SCHUBRING MRN: [id number]; DOB: [birth year]    Admission date: 10/27/2023   PCP:  Dettinger, Fonda LABOR, MD              [facility] Providers Cardiologist:  Alvan Carrier, MD       Chief Complaint:  chest pain   Patient Profile: Carl [name] is a 48 y.o. male with CAD s/p PCI to LAD with DES x 2, recent cath 05/2023 showed occluded D2 with collaterals, recommended medical management, chronic stable angina, hyperlipidemia, hypertension, type 2 diabetes, tobacco abuse, OSA, GERD, anxiety, depression who is being seen 10/27/2023 for the evaluation of chest pain with troponin elevation of roughly 200 consistent with ACS/non-STEMI.  Plan was IV heparin  and cardiac catheterization to assess for possible stent stenosis.  Principal Problem:   NSTEMI (non-ST elevated myocardial infarction) (HCC) Active Problems:   Coronary artery disease involving autologous artery coronary bypass graft with unstable angina pectoris (HCC)   Presence of drug coated stent in LAD coronary artery   Type 2 diabetes mellitus with other specified complication (HCC)   Hyperlipidemia associated with type 2 diabetes mellitus (HCC)   Hypertension associated with diabetes (HCC)   OSA (obstructive sleep apnea)   Tobacco abuse   GERD (gastroesophageal reflux disease)   SUBJECTIVE He underwent cardiac catheterization yesterday revealing occluded LAD stent treated with a history of DES stent. Has done well with no major issues post cath.  TR band is stable.  No chest pain or pressure.  No dyspnea. Has walked around the room but not yet in the hallway.  BP 128/77 (BP Location: Left Arm)   Pulse 82   Temp 98.1 F (36.7 C) (Oral)   Resp 18   Ht 5' 10 (1.778 m)   Wt 112 kg   SpO2 95%   BMI 35.44 kg/m  General appearance: alert, cooperative, appears stated age, no distress, moderately obese, and otherwise healthy appearing Neck: no carotid bruit and no JVD Lungs: clear  to auscultation bilaterally, normal percussion bilaterally, and no obvious wheezes rales or rhonchi.  Nonlabored. Heart: regular rate and rhythm, S1, S2 normal, no murmur, click, rub or gallop and normal apical impulse Abdomen: soft, non-tender; bowel sounds normal; no masses,  no organomegaly and obese Extremities: extremities normal, atraumatic, no cyanosis or edema and right radial cath site clean dry intact.  No hematoma.  Dressing in place.  Reversed Barbeau A Pulses: 2+ and symmetric Neurologic: Alert and oriented X 3, normal strength and tone. Normal symmetric reflexes. Normal coordination and gait   Telemetry: Sinus rhythm EKG: Sinus rhythm with PVCs; rate 77 bpm.  Otherwise normal.  Cardiac Cath-PCI 10/27/2023   Scheduled Meds:  amLODipine   2.5 mg Oral Daily   aspirin  EC  81 mg Oral Daily   atorvastatin   80 mg Oral Daily   clopidogrel   75 mg Oral Q breakfast   empagliflozin   10 mg Oral QAC breakfast   enoxaparin  (LOVENOX ) injection  40 mg Subcutaneous Q24H   insulin  aspart  0-15 Units Subcutaneous TID WC   lisinopril   5 mg Oral Daily   metoprolol  succinate  50 mg Oral Daily   nicotine   21 mg Transdermal Daily   pantoprazole   80 mg Oral Daily   sodium chloride  flush  3 mL Intravenous Q12H   Continuous Infusions:  sodium chloride      PRN Meds:sodium chloride , acetaminophen , nitroGLYCERIN , ondansetron  (ZOFRAN ) IV, sodium chloride  flush  Vital signs in last 24 hours: Temp:  [  97.8 F (36.6 C)-98.7 F (37.1 C)] 98.1 F (36.7 C) (09/04 0745) Pulse Rate:  [64-90] 82 (09/04 0745) Resp:  [10-25] 18 (09/04 0745) BP: (103-134)/(62-88) 128/77 (09/04 0745) SpO2:  [92 %-100 %] 95 % (09/04 0745) Weight:  [887 kg] 112 kg (09/03 1411)  Intake/Output last 3 shifts: I/O last 3 completed shifts: In: 1979.5 [P.O.:800; I.V.:129.5; NG/GT:1000; IV Piggyback:50] Out: -  Intake/Output this shift: No intake/output data recorded.  Problem Assessment/Plan Cardiovascular and  Mediastinum Hypertension associated with diabetes (HCC) Assessment & Plan BP well-controlled on combination lisinopril , Toprol  and lisinopril  at home dose.  Coronary artery disease involving autologous artery coronary bypass graft with unstable angina pectoris [facility]) Assessment & Plan Reocclusion with very late stent thrombosis/in-stent restenosis of the LAD stent at the occluded diagonal branch.  Now treated with overlapping DES PCI. His blood pressure and lipids were all well-controlled as was his diabetes but he had stopped aspirin  and was on Plavix  monotherapy. At this point we will continue Plavix  lifelong.  DAPT with ASA/Plavix  x 1 year)  On combination of beta-blocker, ACE inhibitor and calcium  channel blocker for blood pressure control; high-dose atorvastatin  for lipid management and Mounjaro /Jardiance  for diabetes. => Continue home meds.  The only other remaining risk factor is smoking which he has no interest in cessation, despite counseling.  He will need to have close follow-up in the outpatient setting to assist with his return to work status.  * NSTEMI (non-ST elevated myocardial infarction) [facility]) Assessment & Plan Mild troponin elevation despite having LAD occlusion likely related to existing collaterals limiting damage. Will obtain outpatient DAPT.  Respiratory OSA (obstructive sleep apnea) Assessment & Plan Continue home CPAP use.  Digestive GERD (gastroesophageal reflux disease) Assessment & Plan Was taking over-the-counter omeprazole -with him being back on Plavix , would convert to Rx for Protonix .  Endocrine Hyperlipidemia associated with type 2 diabetes mellitus (HCC) Assessment & Plan Lipids also well-controlled on 80 mg atorvastatin  along with the benefit of Mounjaro  and Jardiance . Continue home meds.  Type 2 diabetes mellitus with other specified complication [facility]) Assessment & Plan Well-controlled with home medications managed by PCP.  On Mounjaro  and  Jardiance . Last A1c was 5.5.  Other Tobacco abuse Assessment & Plan This is the 1 risk factor which will not be under control.  He has no interest in discussing smoking cessation.  He smokes 2 packs a day and understands the risks.  Presence of drug coated stent in LAD coronary artery Assessment & Plan Now with close to 60 mm stent with overlapping stents in the LAD would plan on lifelong Thienopyridine coverage even after the first year of DAPT. After 1 year of DAPT, can stop aspirin  but continue Plavix  they can be interrupted for procedures or surgeries.  Would go back on it post procedure.

## 2023-10-28 NOTE — Discharge Summary (Signed)
 Discharge Summary   Patient ID: Carl [name] MRN: [id number]; DOB: 07/31/1975  Admit date: 10/27/2023 Discharge date: 10/28/2023  PCP:  Dettinger, Fonda LABOR, MD   [facility] Providers Cardiologist:  Alvan Carrier, MD     Discharge Diagnoses  Principal Problem:   NSTEMI (non-ST elevated myocardial infarction) [facility]) Active Problems:   Hyperlipidemia associated with type 2 diabetes mellitus (HCC)   Hypertension associated with diabetes (HCC)   Type 2 diabetes mellitus with other specified complication (HCC)   OSA (obstructive sleep apnea)   Tobacco abuse   Coronary artery disease involving autologous artery coronary bypass graft with unstable angina pectoris (HCC)   GERD (gastroesophageal reflux disease)   Presence of drug coated stent in LAD coronary artery   Diagnostic Studies/Procedures   Cath: 10/27/2023   Ost LAD lesion is 40% stenosed.   Prox LAD lesion is 100% stenosed.  Ost 1st Diag lesion is 100% stenosed.   A drug-eluting stent was successfully placed using a STENT SYNERGY XD 3.0X20.   Post intervention, there is a 0% residual stenosis.   Prox RCA lesion is 40% stenosed.Dist RCA lesion is 40% stenosed.  Ost RPDA lesion is 20% stenosed.   Prox Cx to Mid Cx lesion is 40% stenosed.   The left ventricular systolic function is normal.  The left ventricular ejection fraction is 55-65% by visual estimate.  LV end diastolic pressure is mildly elevated.  Single vessel occlusive CAD. Reocclusion of mid LAD at proximal portion of old stent   Recommend dual antiplatelet therapy with Aspirin  81mg  daily and Clopidogrel  75mg  daily long-term (beyond 12 months) because of multiple stents. Good LV function Mildly elevated LVEDP 22 mm Hg Successful PCI of the LAD with DES x 1 overlapping with old stent   Plan: DAPT indefinitely given multiple LAD stents. Anticipate DC in am.  Diagnostic: Dominance: Right     Intervention     _____________   History of Present Illness    Carl [name] is a 48 y.o. male with CAD s/p PCI to LAD with DES x 2, recent cath 05/2023 showed occluded D2 with collaterals, recommended medical management, chronic stable angina, hyperlipidemia, hypertension, type 2 diabetes, tobacco abuse, OSA, GERD, anxiety, depression who was seen 10/27/2023 for the evaluation of chest pain / NSTEMI   He presented to [facility] ED via EMS on 10/27/2023 for chest pain that occurred ~ 2 hours prior to presentation.  He reported that the chest pain came on while he was driving to work that morning, not exertional.  He denied any recent exertional chest pain or shortness of breath.  He denied any additional symptoms, no syncope, presyncope, N/V, diaphoresis.  He reported he felt relief of his chest pain when given the 325 mg of aspirin  from EMS.  He has had no recurrent chest pain since arrival.  He admitted to still smoking a significant amount, while he has reduced his amount of cigarettes per day he has taken out more in terms of vaping.  He denied any issues with medication compliance, no missed doses. Thru chart review, it appears that he was on DAPT after his PCI in 2019 but self-discontinued his Plavix  in 2020 and then was instructed that it was okay to remain on ASA monotherapy.    He reported recently undergoing a cardiac catheterization in April 2025 and with his job as a Naval architect, which complicated postprocedure return to work, he would prefer to forego any invasive evaluation.    He has  an extensive history of CAD with CTO PCI of LAD in 2019. His most recent LHC was 05/2023 that showed patent stents to LAD, large, chronically occluded D2, opted for medical therapy.    Relevant workup while in the ED: troponin 7 ? 74 ? 191, BMP stable, CBC normal, CXR slight cardiomegaly, no active disease. EKG without acute ischemic changes. Patient reports resolution of chest pain with administration of ASA per EMS.    He described the symptom as being a pressure from  between his shoulder blades and pushed forward into his chest.  He did not recall the symptoms he had prior to his CTO PCI.  For the most part he has been pretty compliant as far as treating his lipids and diabetes along with blood pressure.  However he continued to smoke which is the 1 major risk factor that he still has.  He has a significant amount of stent in the LAD and was only on aspirin .     Hospital Course    He underwent cardiac catheterization yesterday revealing occluded LAD stent treated with a history of DES stent (on 10/27/2023). Has done well with no major issues post cath.  TR band is stable.  No chest pain or pressure.  No dyspnea. Has walked around the room but not yet in the hallway.  NSTEMI CAD s/p prior PCI -- presented with chest pain and found to have elevated hsTn peaked at 242 -- underwent cardiac cath noted above with PCI/DES x1 to late stent thrombosis/ISR of the LAD at occluded diagonal branch. Recommendations for DAPT with ASA/Plavix  for at least one year, then long term plavix   -- continue ASA, plavix , atorvastatin  80mg  daily, Toprol  50mg  daily, norvasc  2.5mg  daily, lisinopril  5mg  daily  -- For GERD, recommend switching from over-the-counter omeprazole  to 40 mg p.o. Protonix .  HTN -- controlled -- continue Toprol  50mg  daily, norvasc  2.5mg  daily, lisinopril  5mg  daily   HLD -- HDL 34, LDL 51 -- continue atorvastatin  80mg  daily   DM -- continue jardiance   Tobacco use -- cessation advised, but reports no interest in quitting at this time  Patient seen by Dr. Anner and deemed stable for discharge home. Follow up arranged in the office. Medications sent to the [facility] pharmacy.   Vital signs in last 24 hours: Temp:  [97.8 F (36.6 C)-98.7 F (37.1 C)] 98.1 F (36.7 C) (09/04 0745) Pulse Rate:  [64-90] 82 (09/04 0745) Resp:  [10-25] 18 (09/04 0745) BP: (103-134)/(62-88) 128/77 (09/04 0745) SpO2:  [92 %-100 %] 95 % (09/04 0745) Weight:  [887 kg] 112 kg  (09/03 1411)  Physical Exam BP 128/77 (BP Location: Left Arm)   Pulse 82   Temp 98.1 F (36.7 C) (Oral)   Resp 18   Ht 5' 10 (1.778 m)   Wt 112 kg   SpO2 95%   BMI 35.44 kg/m  General appearance: alert, cooperative, appears stated age, no distress, moderately obese, and otherwise healthy appearing Neck: no carotid bruit and no JVD Lungs: clear to auscultation bilaterally, normal percussion bilaterally, and no obvious wheezes rales or rhonchi.  Nonlabored. Heart: regular rate and rhythm, S1, S2 normal, no murmur, click, rub or gallop and normal apical impulse Abdomen: soft, non-tender; bowel sounds normal; no masses,  no organomegaly and obese Extremities: extremities normal, atraumatic, no cyanosis or edema and right radial cath site clean dry intact.  No hematoma.  Dressing in place.  Reversed Barbeau A Pulses: 2+ and symmetric Neurologic: Alert and oriented X 3, normal strength  and tone. Normal symmetric reflexes. Normal coordination and gait  Did the patient have an acute coronary syndrome (MI, NSTEMI, STEMI, etc) this admission?:  Yes                               AHA/ACC ACS Clinical Performance & Quality Measures: Aspirin  prescribed? - Yes ADP Receptor Inhibitor (Plavix /Clopidogrel , Brilinta/Ticagrelor or Effient/Prasugrel) prescribed (includes medically managed patients)? - Yes Beta Blocker prescribed? - Yes High Intensity Statin (Lipitor  40-80mg  or Crestor 20-40mg ) prescribed? - Yes EF assessed during THIS hospitalization? - No - Outpatient Echocardiogram will be scheduled to assess EF. For EF <40%, was ACEI/ARB prescribed? - No - Outpatient Echocardiogram to assess EF will be scheduled. For EF <40%, Aldosterone Antagonist (Spironolactone or Eplerenone) prescribed? - No - Outpatient Echocardiogram to assess EF will be scheduled. Cardiac Rehab Phase II ordered (including medically managed patients)? - Yes => 2D Echo Ordered  Informed Consent        The patient will be  scheduled for a TOC follow up appointment in 12 days.  A message has been sent to the [facility] and Scheduling Pool at the office where the patient should be seen for follow up.    _____________  Discharge Vitals Blood pressure 128/77, pulse 82, temperature 98.1 F (36.7 C), temperature source Oral, resp. rate 18, height 5' 10 (1.778 m), weight 112 kg, SpO2 95%.  Filed Weights   10/27/23 0343 10/27/23 1411  Weight: 112 kg 112 kg    Labs & Radiologic Studies  CBC Recent Labs    10/27/23 0429 10/28/23 0428  WBC 7.1 6.9  NEUTROABS 3.8  --   HGB 15.5 15.1  HCT 45.7 43.6  MCV 94.4 92.2  PLT 173 166   Basic Metabolic Panel Recent Labs    90/96/74 0429 10/28/23 0428  NA 139 138  K 4.3 4.1  CL 104 103  CO2 25 23  GLUCOSE 131* 88  BUN 11 13  CREATININE 1.03 1.07  CALCIUM  9.1 9.1   Liver Function Tests No results for input(s): AST, ALT, ALKPHOS, BILITOT, PROT, ALBUMIN in the last 72 hours. No results for input(s): LIPASE, AMYLASE in the last 72 hours. High Sensitivity Troponin:   Recent Labs  Lab 10/27/23 0429 10/27/23 0634 10/27/23 0838 10/27/23 1010  TROPONINIHS 7 74* 191* 242*    No results for input(s): TRNPT in the last 720 hours.  BNP Invalid input(s): POCBNP No results for input(s): PROBNP in the last 72 hours.  No results for input(s): BNP in the last 72 hours.  D-Dimer No results for input(s): DDIMER in the last 72 hours. Hemoglobin A1C No results for input(s): HGBA1C in the last 72 hours. Fasting Lipid Panel No results for input(s): CHOL, HDL, LDLCALC, TRIG, CHOLHDL, LDLDIRECT in the last 72 hours. No results found for: LIPOA  Thyroid  Function Tests No results for input(s): TSH, T4TOTAL, T3FREE, THYROIDAB in the last 72 hours.  Invalid input(s): FREET3 _____________  CARDIAC CATHETERIZATION Result Date: 10/27/2023   Ost LAD lesion is 40% stenosed.   Dist RCA lesion is 40% stenosed.   Ost 1st Diag  lesion is 100% stenosed.   Ost RPDA lesion is 20% stenosed.   Prox Cx to Mid Cx lesion is 40% stenosed.   Prox RCA lesion is 40% stenosed.   Prox LAD lesion is 100% stenosed.   A drug-eluting stent was successfully placed using a STENT SYNERGY XD 3.0X20.   Post intervention,  there is a 0% residual stenosis.   The left ventricular systolic function is normal.   LV end diastolic pressure is mildly elevated.   The left ventricular ejection fraction is 55-65% by visual estimate.   Recommend dual antiplatelet therapy with Aspirin  81mg  daily and Clopidogrel  75mg  daily long-term (beyond 12 months) because of multiple stents. Single vessel occlusive CAD. Reocclusion of mid LAD at proximal portion of old stent Good LV function Mildly elevated LVEDP 22 mm Hg Successful PCI of the LAD with DES x 1 overlapping with old stent Plan: DAPT indefinitely given multiple LAD stents. Anticipate DC in am.   DG Chest Port 1 View Result Date: 10/27/2023 CLINICAL DATA:  Chest pain. EXAM: PORTABLE CHEST 1 VIEW COMPARISON:  PA and lateral chest 12/19/2015 FINDINGS: The heart size and mediastinal contours are unchanged, with slight cardiomegaly. Both lungs are clear. The visualized skeletal structures are unremarkable. IMPRESSION: No active disease.  Stable chest with slight cardiomegaly. Electronically Signed   By: Francis Quam M.D.   On: 10/27/2023 05:32    Disposition Pt is being discharged home today in good condition.  Follow-up Plans & Appointments  Discharge Instructions     AMB Referral to Cardiac Rehabilitation - Phase II   Complete by: As directed    Diagnosis:  NSTEMI Coronary Stents     After initial evaluation and assessments completed: Virtual Based Care may be provided alone or in conjunction with Phase 2 Cardiac Rehab based on patient barriers.: Yes   Intensive Cardiac Rehabilitation (ICR) [facility] location only OR Traditional Cardiac Rehabilitation (TCR) *If criteria for ICR are not met will enroll in TCR [facility]  only): Yes   Call MD for:  redness, tenderness, or signs of infection (pain, swelling, redness, odor or green/yellow discharge around incision site)   Complete by: As directed    Diet - low sodium heart healthy   Complete by: As directed    Discharge instructions   Complete by: As directed    Radial Site Care Refer to this sheet in the next few weeks. These instructions provide you with information on caring for yourself after your procedure. Your caregiver may also give you more specific instructions. Your treatment has been planned according to current medical practices, but problems sometimes occur. Call your caregiver if you have any problems or questions after your procedure. HOME CARE INSTRUCTIONS You may shower the day after the procedure. Remove the bandage (dressing) and gently wash the site with plain soap and water . Gently pat the site dry.  Do not apply powder or lotion to the site.  Do not submerge the affected site in water  for 3 to 5 days.  Inspect the site at least twice daily.  Do not flex or bend the affected arm for 24 hours.  No lifting over 5 pounds (2.3 kg) for 5 days after your procedure.  Do not drive home if you are discharged the same day of the procedure. Have someone else drive you.  You may drive 24 hours after the procedure unless otherwise instructed by your caregiver.  What to expect: Any bruising will usually fade within 1 to 2 weeks.  Blood that collects in the tissue (hematoma) may be painful to the touch. It should usually decrease in size and tenderness within 1 to 2 weeks.  SEEK IMMEDIATE MEDICAL CARE IF: You have unusual pain at the radial site.  You have redness, warmth, swelling, or pain at the radial site.  You have drainage (other than a small  amount of blood on the dressing).  You have chills.  You have a fever or persistent symptoms for more than 72 hours.  You have a fever and your symptoms suddenly get worse.  Your arm becomes pale, cool,  tingly, or numb.  You have heavy bleeding from the site. Hold pressure on the site.   PLEASE DO NOT MISS ANY DOSES OF YOUR PLAVIX !!!!! Also keep a log of you blood pressures and bring back to your follow up appt. Please call the office with any questions.   Patients taking blood thinners should generally stay away from medicines like ibuprofen , Advil , Motrin , naproxen , and Aleve  due to risk of stomach bleeding. You may take Tylenol  as directed or talk to your primary doctor about alternatives.   PLEASE ENSURE THAT YOU DO NOT RUN OUT OF YOUR PLAVIX . This medication is very important to remain on for at least one year. IF you have issues obtaining this medication due to cost please CALL the office 3-5 business days prior to running out in order to prevent missing doses of this medication.   Increase activity slowly   Complete by: As directed        Discharge Medications Allergies as of 10/28/2023   No Known Allergies      Medication List     STOP taking these medications    omeprazole  40 MG capsule Commonly known as: PRILOSEC       TAKE these medications    amLODipine  2.5 MG tablet Commonly known as: NORVASC  TAKE 1 TABLET BY MOUTH DAILY   aspirin  EC 81 MG tablet Take 81 mg by mouth daily.   atorvastatin  80 MG tablet Commonly known as: LIPITOR  Take 1 tablet (80 mg total) by mouth daily.   b complex vitamins capsule Take 1 capsule by mouth daily.   clopidogrel  75 MG tablet Commonly known as: PLAVIX  Take 1 tablet (75 mg total) by mouth daily with breakfast. Start taking on: October 29, 2023   Jardiance  10 MG Tabs tablet Generic drug: empagliflozin  TAKE 1 TABLET DAILY BEFORE BREAKFAST   lisinopril  5 MG tablet Commonly known as: ZESTRIL  Take 1 tablet (5 mg total) by mouth daily.   metoprolol  succinate 50 MG 24 hr tablet Commonly known as: TOPROL -XL TAKE 1 TABLET BY MOUTH EVERY DAY with OR immediately AFTER a meal   nitroGLYCERIN  0.4 MG SL tablet Commonly  known as: NITROSTAT  Place 1 tablet (0.4 mg total) under the tongue every 5 (five) minutes x 3 doses as needed for chest pain (if no relief after 2nd dose, proceed to ED or call 911).   pantoprazole  40 MG tablet Commonly known as: Protonix  Take 1 tablet (40 mg total) by mouth daily.   tirzepatide  7.5 MG/0.5ML Pen Commonly known as: MOUNJARO  Inject 7.5 mg into the skin once a week.   vitamin C 1000 MG tablet Take 2,000 mg by mouth daily.         Outstanding Labs/Studies  Echo- scheduled in [place]  Duration of Discharge Encounter: APP Time: 20 minutes; MD Time 35 min   Signed, Manuelita Rummer, NP 10/28/2023, 9:01 AM  I saw the patient on rounds today.  See final progress note for full details.  The [place] of the discharge summary as was written by Manuelita Rummer, NP along with outpatient orders and scheduling outpatient appointment. I personally met with the patient and discussed cardiac cath and PCI findings as well as his medication regimen and attempt to smoke cessation counseling.  MD time 35 minutes  Alm MICAEL Clay, MD, MS Alm Clay, M.D., M.S. Interventional Cardiologist  [facility] Pager # [phone]

## 2023-10-28 NOTE — Assessment & Plan Note (Addendum)
 Reocclusion with very late stent thrombosis/in-stent restenosis of the LAD stent at the occluded diagonal branch.  Now treated with overlapping DES PCI. His blood pressure and lipids were all well-controlled as was his diabetes but he had stopped aspirin  and was on Plavix  monotherapy. At this point we will continue Plavix  lifelong.  DAPT with ASA/Plavix  x 1 year)  On combination of beta-blocker, ACE inhibitor and calcium  channel blocker for blood pressure control; high-dose atorvastatin  for lipid management and Mounjaro /Jardiance  for diabetes. => Continue home meds.  The only other remaining risk factor is smoking which he has no interest in cessation, despite counseling.  He will need to have close follow-up in the outpatient setting to assist with his return to work status.

## 2023-10-28 NOTE — Telephone Encounter (Signed)
 Pt will be seen at [facility] [address]

## 2023-10-28 NOTE — Telephone Encounter (Signed)
   Transition of Care Follow-up Phone Call Request    Patient Name: Carl [name] Date of Birth: 04-16-1975 Date of Encounter: 10/28/2023  Primary Care Provider:  Dettinger, Fonda LABOR, MD Primary Cardiologist:  Alvan Carrier, MD  [name] has been scheduled for a transition of care follow up appointment with a [facility] provider:  Olivia Pavy 9/16  Please reach out to [name] within 48 hours of discharge to confirm appointment and review transition of care protocol questionnaire. Anticipated discharge date: 9/4  Manuelita Rummer, NP  10/28/2023, 8:36 AM

## 2023-10-28 NOTE — Assessment & Plan Note (Addendum)
 Well-controlled with home medications managed by PCP.  On Mounjaro  and Jardiance . Last A1c was 5.5.

## 2023-10-28 NOTE — Assessment & Plan Note (Signed)
 Mild troponin elevation despite having LAD occlusion likely related to existing collaterals limiting damage. Will obtain outpatient DAPT.

## 2023-10-28 NOTE — Assessment & Plan Note (Signed)
 This is the 1 risk factor which will not be under control.  He has no interest in discussing smoking cessation.  He smokes 2 packs a day and understands the risks.

## 2023-10-28 NOTE — Progress Notes (Signed)
 Alert oriented in good spirits.   Seen by Cardiac Rehab prior to discharge.  Verbalized understanding of discharge instructions.   PIV removed by Student RN and her Instructor.  TELE removed and placed at nurse station by Primary RN.   TOC ready and to be transported to  [employer] for a taxi voucher.

## 2023-10-28 NOTE — Assessment & Plan Note (Signed)
 Continue home CPAP use.

## 2023-10-28 NOTE — Assessment & Plan Note (Signed)
 Now with close to 60 mm stent with overlapping stents in the LAD would plan on lifelong Thienopyridine coverage even after the first year of DAPT. After 1 year of DAPT, can stop aspirin  but continue Plavix  they can be interrupted for procedures or surgeries.  Would go back on it post procedure.

## 2023-10-28 NOTE — Plan of Care (Signed)
 Problem: Education: Goal: Ability to describe self-care measures that may prevent or decrease complications (Diabetes Survival Skills Education) will improve Outcome: Adequate for Discharge Goal: Individualized Educational Video(s) Outcome: Adequate for Discharge   Problem: Coping: Goal: Ability to adjust to condition or change in health will improve Outcome: Adequate for Discharge   Problem: Fluid Volume: Goal: Ability to maintain a balanced intake and output will improve Outcome: Adequate for Discharge   Problem: Health Behavior/Discharge Planning: Goal: Ability to identify and utilize available resources and services will improve Outcome: Adequate for Discharge Goal: Ability to manage health-related needs will improve Outcome: Adequate for Discharge   Problem: Metabolic: Goal: Ability to maintain appropriate glucose levels will improve Outcome: Adequate for Discharge   Problem: Nutritional: Goal: Maintenance of adequate nutrition will improve Outcome: Adequate for Discharge Goal: Progress toward achieving an optimal weight will improve Outcome: Adequate for Discharge   Problem: Skin Integrity: Goal: Risk for impaired skin integrity will decrease Outcome: Adequate for Discharge   Problem: Tissue Perfusion: Goal: Adequacy of tissue perfusion will improve Outcome: Adequate for Discharge   Problem: Education: Goal: Understanding of cardiac disease, CV risk reduction, and recovery process will improve Outcome: Adequate for Discharge Goal: Individualized Educational Video(s) Outcome: Adequate for Discharge   Problem: Activity: Goal: Ability to tolerate increased activity will improve Outcome: Adequate for Discharge   Problem: Cardiac: Goal: Ability to achieve and maintain adequate cardiovascular perfusion will improve Outcome: Adequate for Discharge   Problem: Health Behavior/Discharge Planning: Goal: Ability to safely manage health-related needs after discharge  will improve Outcome: Adequate for Discharge   Problem: Education: Goal: Knowledge of General Education information will improve Description: Including pain rating scale, medication(s)/side effects and non-pharmacologic comfort measures Outcome: Adequate for Discharge   Problem: Health Behavior/Discharge Planning: Goal: Ability to manage health-related needs will improve Outcome: Adequate for Discharge   Problem: Clinical Measurements: Goal: Ability to maintain clinical measurements within normal limits will improve Outcome: Adequate for Discharge Goal: Will remain free from infection Outcome: Adequate for Discharge Goal: Diagnostic test results will improve Outcome: Adequate for Discharge Goal: Respiratory complications will improve Outcome: Adequate for Discharge Goal: Cardiovascular complication will be avoided Outcome: Adequate for Discharge   Problem: Activity: Goal: Risk for activity intolerance will decrease Outcome: Adequate for Discharge   Problem: Nutrition: Goal: Adequate nutrition will be maintained Outcome: Adequate for Discharge   Problem: Coping: Goal: Level of anxiety will decrease Outcome: Adequate for Discharge   Problem: Elimination: Goal: Will not experience complications related to bowel motility Outcome: Adequate for Discharge Goal: Will not experience complications related to urinary retention Outcome: Adequate for Discharge   Problem: Pain Managment: Goal: General experience of comfort will improve and/or be controlled Outcome: Adequate for Discharge   Problem: Safety: Goal: Ability to remain free from injury will improve Outcome: Adequate for Discharge   Problem: Skin Integrity: Goal: Risk for impaired skin integrity will decrease Outcome: Adequate for Discharge   Problem: Education: Goal: Understanding of CV disease, CV risk reduction, and recovery process will improve Outcome: Adequate for Discharge Goal: Individualized Educational  Video(s) Outcome: Adequate for Discharge   Problem: Activity: Goal: Ability to return to baseline activity level will improve Outcome: Adequate for Discharge   Problem: Cardiovascular: Goal: Ability to achieve and maintain adequate cardiovascular perfusion will improve Outcome: Adequate for Discharge Goal: Vascular access site(s) Level 0-1 will be maintained Outcome: Adequate for Discharge   Problem: Health Behavior/Discharge Planning: Goal: Ability to safely manage health-related needs after discharge will improve Outcome: Adequate for  Discharge

## 2023-10-28 NOTE — Progress Notes (Signed)
 CARDIAC REHAB PHASE I   PRE:  Rate/Rhythm: 86 NSR  BP:  Sitting: 128/77      SpO2: 97 RA  MODE:  Ambulation: 470 ft    POST:  Rate/Rhythm: 126 ST  BP:  Sitting: 147/98      SpO2: 97 RA  Pt amb with supervision assistance, pt walked well without symptoms.   Pt was educated on stent card, stent location, Antiplatelet and ASA use, wt restrictions, no baths/daily wash-ups, s/s of infection, ex guidelines, s/s to stop exercising, NTG use and calling 911, heart healthy diet, risk factors and CRPII. Pt received materials on exercise, diet, and CRPII. Will refer to [facility].   Pt currently in grief over the recent passing  of his wife. Pt has tried to get into counseling but backed out d/t long waitlist. Myself and CR RN encouraged pt to get another counseling appt however there are motivational barriers to counseling and rehab. 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   Carl FORBES Candy  MS, ACSM-CEP 10:21 AM 10/28/2023    Service time is from 0950 to 1021.

## 2023-10-28 NOTE — Assessment & Plan Note (Signed)
 Was taking over-the-counter omeprazole -with him being back on Plavix , would convert to Rx for Protonix .

## 2023-10-28 NOTE — Assessment & Plan Note (Signed)
 Lipids also well-controlled on 80 mg atorvastatin  along with the benefit of Mounjaro  and Jardiance . Continue home meds.

## 2023-10-29 ENCOUNTER — Telehealth: Payer: Self-pay | Admitting: Cardiology

## 2023-10-29 DIAGNOSIS — Z0279 Encounter for issue of other medical certificate: Secondary | ICD-10-CM

## 2023-10-29 LAB — LIPOPROTEIN A (LPA): Lipoprotein (a): 33 nmol/L — ABNORMAL HIGH (ref <75.0)

## 2023-10-29 NOTE — Telephone Encounter (Signed)
 Pt brought in FMLA papers  Scanned into documents in chart Paid 29 cash Placed on Dr.Branch desk

## 2023-11-01 ENCOUNTER — Telehealth (HOSPITAL_COMMUNITY): Payer: Self-pay

## 2023-11-01 ENCOUNTER — Ambulatory Visit: Attending: Cardiology

## 2023-11-01 DIAGNOSIS — I214 Non-ST elevation (NSTEMI) myocardial infarction: Secondary | ICD-10-CM | POA: Diagnosis not present

## 2023-11-01 NOTE — Telephone Encounter (Signed)
 Called patient to go over cardiac rehab, patient is interested in program but is concerned about scheduling as he lives in [place]. Patient confirmed he would like referral sent to [facility].  Faxing referral to [place].

## 2023-11-03 ENCOUNTER — Ambulatory Visit: Payer: Self-pay | Admitting: Family Medicine

## 2023-11-03 LAB — ECHOCARDIOGRAM COMPLETE
AR max vel: 2.58 cm2
AV Area VTI: 2.83 cm2
AV Area mean vel: 2.34 cm2
AV Mean grad: 3.9 mmHg
AV Peak grad: 7.1 mmHg
Ao pk vel: 1.33 m/s
Area-P 1/2: 3.76 cm2
S' Lateral: 3.7 cm
Single Plane A4C EF: 56.3 %

## 2023-11-08 NOTE — Progress Notes (Unsigned)
 Cardiology Office Note:  .   Date:  11/09/2023  ID:  Carl [name], DOB 1975/08/22, MRN [id number] PCP: Dettinger, Fonda LABOR, MD  [facility] Providers Cardiologist:  Alvan Carrier, MD    History of Present Illness: .   Carl [name] is a 48 y.o. male  with CAD s/p PCI to LAD with DES x 2, recent cath 05/2023 showed occluded D2 with collaterals, recommended medical management, chronic stable angina, hyperlipidemia, hypertension, type 2 diabetes, tobacco abuse, OSA, GERD, anxiety, depression.  Patient was admitted 10/27/23 with NSTEMI and found to have occluded LAD stent treated with DES. Occurred on plavix  alone.Plan for lifelong Plavix  & DAPT with ASA/Plavix  for at least one year. He continues to smoke.  Patient anxious to get back to work. 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 Denies chest pain, dyspnea, palpitations, edema. No regular exercise. Continues to smoke 1/2 pack/day and vapes throughout the day as well. Not willing to cut back.   ROS:    Studies Reviewed: SABRA         Prior CV Studies:    Echo 11/01/23 IMPRESSIONS     1. Left ventricular ejection fraction, by estimation, is 55 to 60%. The  left ventricle has normal function. The left ventricle has no regional  wall motion abnormalities. Left ventricular diastolic parameters were  normal.   2. Right ventricular systolic function is normal. The right ventricular  size is normal.   3. The mitral valve is normal in structure. No evidence of mitral valve  regurgitation. No evidence of mitral stenosis.   4. The aortic valve is tricuspid. Aortic valve regurgitation is not  visualized. No aortic stenosis is present.   5. Aortic dilatation noted. There is mild dilatation of the ascending  aorta, measuring 37 mm.   6. The inferior vena cava is normal in size with greater than 50%  respiratory variability, suggesting right atrial pressure of 3 mmHg.   Comparison(s): EF 55-  Cath:  10/27/2023   Ost LAD lesion is 40% stenosed.   Prox LAD lesion is 100% stenosed.  Ost 1st Diag lesion is 100% stenosed.   A drug-eluting stent was successfully placed using a STENT SYNERGY XD 3.0X20.   Post intervention, there is a 0% residual stenosis.   Prox RCA lesion is 40% stenosed.Dist RCA lesion is 40% stenosed.  Ost RPDA lesion is 20% stenosed.   Prox Cx to Mid Cx lesion is 40% stenosed.   The left ventricular systolic function is normal.  The left ventricular ejection fraction is 55-65% by visual estimate.  LV end diastolic pressure is mildly elevated.   Single vessel occlusive CAD. Reocclusion of mid LAD at proximal portion of old stent   Recommend dual antiplatelet therapy with Aspirin  81mg  daily and Clopidogrel  75mg  daily long-term (beyond 12 months) because of multiple stents. Good LV function Mildly elevated LVEDP 22 mm Hg Successful PCI of the LAD with DES x 1 overlapping with old stent   Plan: DAPT indefinitely given multiple LAD stents. Anticipate DC in am.   Diagnostic: Dominance: Right                                                          Intervention       Risk Assessment/Calculations:  Physical Exam:   VS:  BP 107/71   Pulse 87   Ht 5' 10 (1.778 m)   Wt 252 lb 12.8 oz (114.7 kg)   SpO2 94%   BMI 36.27 kg/m    Orhtostatics: No data found. Wt Readings from Last 3 Encounters:  11/09/23 252 lb 12.8 oz (114.7 kg)  10/27/23 247 lb (112 kg)  10/14/23 249 lb (112.9 kg)    GEN: Well nourished, well developed in no acute distress NECK: No JVD; No carotid bruits CARDIAC:  RRR, no murmurs, rubs, gallops RESPIRATORY:  Clear to auscultation without rales, wheezing or rhonchi  ABDOMEN: Soft, non-tender, non-distended EXTREMITIES:  No edema; No deformity   ASSESSMENT AND PLAN: .    NSTEMI CAD s/p prior PCI --  PCI/DES x1 to late stent thrombosis/ISR of the LAD at occluded diagonal branch. Recommendations for DAPT with ASA/Plavix  for at least one  year, then long term plavix   -- continue ASA, plavix , atorvastatin  80mg  daily, Toprol  50mg  daily, norvasc  2.5mg  daily, lisinopril  5mg  daily  --normal LVEF on echo --may return to work, no heavy lifting >10 lbs  --ok to return to work with no heavy lifting. He doesn't have paperwork with him. Can take to [facility] office where he'll follow up at.    HTN -- controlled -- continue Toprol  50mg  daily, norvasc  2.5mg  daily, lisinopril  5mg  daily  --check bmet today   HLD -- HDL 34, LDL 51 -- continue atorvastatin  80mg  daily    DM -- continue jardiance    Tobacco use -- cessation advised, but reports no interest in quitting at this time      Cardiac Rehabilitation Eligibility Assessment  The patient is ready to start cardiac rehabilitation from a cardiac standpoint.       Dispo: Dr. Alvan Car office 2 months  Signed, Olivia Pavy, PA-C

## 2023-11-09 ENCOUNTER — Encounter: Payer: Self-pay | Admitting: Physician Assistant

## 2023-11-09 ENCOUNTER — Ambulatory Visit: Attending: Physician Assistant | Admitting: Physician Assistant

## 2023-11-09 VITALS — BP 107/71 | HR 87 | Ht 70.0 in | Wt 252.8 lb

## 2023-11-09 DIAGNOSIS — I25118 Atherosclerotic heart disease of native coronary artery with other forms of angina pectoris: Secondary | ICD-10-CM | POA: Diagnosis not present

## 2023-11-09 DIAGNOSIS — E1159 Type 2 diabetes mellitus with other circulatory complications: Secondary | ICD-10-CM | POA: Diagnosis not present

## 2023-11-09 DIAGNOSIS — E785 Hyperlipidemia, unspecified: Secondary | ICD-10-CM

## 2023-11-09 DIAGNOSIS — E1169 Type 2 diabetes mellitus with other specified complication: Secondary | ICD-10-CM | POA: Diagnosis not present

## 2023-11-09 DIAGNOSIS — Z720 Tobacco use: Secondary | ICD-10-CM

## 2023-11-09 DIAGNOSIS — I152 Hypertension secondary to endocrine disorders: Secondary | ICD-10-CM

## 2023-11-09 NOTE — Patient Instructions (Addendum)
 Thank you for choosing [facility]!     Medication Instructions:  No medication changes were made during today's visit.    *If you need a refill on your cardiac medications before your next appointment, please call your pharmacy*   Lab Work: Labs will be drawn today...................... BMET If you have labs (blood work) drawn today and your tests are completely normal, you will receive your results only by: MyChart Message (if you have MyChart) OR A paper copy in the mail If you have any lab test that is abnormal or we need to change your treatment, we will call you to review the results.   Testing/Procedures: No procedures were ordered during today's visit.   Your next appointment:   2-3 month(s)   Provider:   You may see Alvan Carrier, MD  Follow-Up: At [facility], you and your health needs are our priority.  As part of our continuing mission to provide you with exceptional heart care, we have created designated Provider Care Teams.  These Care Teams include your primary Cardiologist (physician) and Advanced Practice Providers (APPs -  Physician Assistants and Nurse Practitioners) who all work together to provide you with the care you need, when you need it. We recommend signing up for the patient portal called MyChart.  Sign up information is provided on this After Visit Summary.  MyChart is used to connect with patients for Virtual Visits (Telemedicine).  Patients are able to view lab/test results, encounter notes, upcoming appointments, etc.  Non-urgent messages can be sent to your provider as well.   To learn more about what you can do with MyChart, go to [email or web address].

## 2023-11-10 ENCOUNTER — Ambulatory Visit: Payer: Self-pay | Admitting: Physician Assistant

## 2023-11-10 LAB — BASIC METABOLIC PANEL WITH GFR
BUN/Creatinine Ratio: 17 (ref 9–20)
BUN: 17 mg/dL (ref 6–24)
CO2: 23 mmol/L (ref 20–29)
Calcium: 9.4 mg/dL (ref 8.7–10.2)
Chloride: 101 mmol/L (ref 96–106)
Creatinine, Ser: 0.99 mg/dL (ref 0.76–1.27)
Glucose: 92 mg/dL (ref 70–99)
Potassium: 4.4 mmol/L (ref 3.5–5.2)
Sodium: 138 mmol/L (ref 134–144)
eGFR: 93 mL/min/1.73 (ref >59)

## 2023-11-10 NOTE — Telephone Encounter (Signed)
 Pt came in for a follow up on paperwork   Updated fax # [phone]  Email [email or web address]

## 2023-11-12 ENCOUNTER — Encounter (HOSPITAL_COMMUNITY)
Admission: RE | Admit: 2023-11-12 | Discharge: 2023-11-12 | Disposition: A | Discharge status: Home / Self Care | Source: Ambulatory Visit | Attending: Cardiology | Admitting: Cardiology

## 2023-11-12 DIAGNOSIS — I214 Non-ST elevation (NSTEMI) myocardial infarction: Secondary | ICD-10-CM | POA: Insufficient documentation

## 2023-11-12 DIAGNOSIS — Z955 Presence of coronary angioplasty implant and graft: Secondary | ICD-10-CM | POA: Insufficient documentation

## 2023-11-12 NOTE — Progress Notes (Signed)
 Virtual orientation visit completed for cardiac rehab with NSTEMI/Stent placement. On-site orientation visit scheduled for 11/15/23 at 10:30.

## 2023-11-14 ENCOUNTER — Other Ambulatory Visit: Payer: Self-pay | Admitting: Cardiology

## 2023-11-14 ENCOUNTER — Other Ambulatory Visit: Payer: Self-pay | Admitting: Family Medicine

## 2023-11-14 DIAGNOSIS — E1169 Type 2 diabetes mellitus with other specified complication: Secondary | ICD-10-CM

## 2023-11-14 DIAGNOSIS — E1159 Type 2 diabetes mellitus with other circulatory complications: Secondary | ICD-10-CM

## 2023-11-15 ENCOUNTER — Encounter (HOSPITAL_COMMUNITY)
Admission: RE | Admit: 2023-11-15 | Discharge: 2023-11-15 | Disposition: A | Discharge status: Home / Self Care | Source: Ambulatory Visit | Attending: Cardiology

## 2023-11-15 ENCOUNTER — Other Ambulatory Visit

## 2023-11-15 VITALS — Ht 71.0 in | Wt 249.6 lb

## 2023-11-15 DIAGNOSIS — Z955 Presence of coronary angioplasty implant and graft: Secondary | ICD-10-CM

## 2023-11-15 DIAGNOSIS — I214 Non-ST elevation (NSTEMI) myocardial infarction: Secondary | ICD-10-CM

## 2023-11-15 NOTE — Progress Notes (Signed)
 Cardiac Individual Treatment Plan  Patient Details  Name: Carl [name] MRN: [id number] Date of Birth: [birth year] Referring Provider:   Flowsheet Row CARDIAC REHAB PHASE II ORIENTATION from 11/15/2023 in [facility] CARDIAC REHABILITATION  Referring Provider Anner Lenis MD  Suzzette Carrier MD sending everything]    Initial Encounter Date:  Flowsheet Row CARDIAC REHAB PHASE II ORIENTATION from 11/15/2023 in [place] CARDIAC REHABILITATION  Date 11/15/23    Visit Diagnosis: NSTEMI (non-ST elevated myocardial infarction) [facility])  Status post coronary artery stent placement  Patient's Home Medications on Admission:  Current Outpatient Medications:    amLODipine  (NORVASC ) 2.5 MG tablet, TAKE 1 TABLET BY MOUTH DAILY, Disp: 90 tablet, Rfl: 3   Ascorbic Acid (VITAMIN C) 1000 MG tablet, Take 2,000 mg by mouth daily., Disp: , Rfl:    aspirin  EC 81 MG tablet, Take 81 mg by mouth daily. , Disp: , Rfl:    atorvastatin  (LIPITOR ) 80 MG tablet, Take 1 tablet (80 mg total) by mouth daily., Disp: 90 tablet, Rfl: 3   b complex vitamins capsule, Take 1 capsule by mouth daily., Disp: , Rfl:    clopidogrel  (PLAVIX ) 75 MG tablet, Take 1 tablet (75 mg total) by mouth daily with breakfast., Disp: 90 tablet, Rfl: 3   empagliflozin  (JARDIANCE ) 10 MG TABS tablet, TAKE 1 TABLET DAILY BEFORE BREAKFAST, Disp: 90 tablet, Rfl: 0   lisinopril  (ZESTRIL ) 5 MG tablet, Take 1 tablet (5 mg total) by mouth daily., Disp: 90 tablet, Rfl: 0   metoprolol  succinate (TOPROL -XL) 50 MG 24 hr tablet, TAKE 1 TABLET BY MOUTH EVERY DAY with OR immediately AFTER a meal, Disp: 90 tablet, Rfl: 3   nitroGLYCERIN  (NITROSTAT ) 0.4 MG SL tablet, Place 1 tablet (0.4 mg total) under the tongue every 5 (five) minutes x 3 doses as needed for chest pain (if no relief after 2nd dose, proceed to ED or call 911)., Disp: 25 tablet, Rfl: 3   pantoprazole  (PROTONIX ) 40 MG tablet, Take 1 tablet (40 mg total) by mouth daily., Disp: 90 tablet, Rfl: 3    tirzepatide  (MOUNJARO ) 7.5 MG/0.5ML Pen, Inject 7.5 mg into the skin once a week., Disp: 6 mL, Rfl: 3  Past Medical History: Past Medical History:  Diagnosis Date   Anxiety    CAD in native artery    a. cath in 2015 [facility] showing occluded proximal LAD, LAD filled with left-left and right-left collaterals, otherwise 20-30% prox-mid RCA, LVEF 55%. b. Abnormal nuc 02/2016 - [facility] medically. c. redo cath in 06/2017 showing 100% Prox LAD stenosis, 50% RCA, and 40% RPDA and underwent CTO PCI of the LAD with DESx2 to in 08/2017   Daily headache    since I started Plavix  (08/25/2017)   Depression    Diabetes mellitus type 2 in obese    Hyperlipidemia    Hypertension    OSA on CPAP     Tobacco Use: Social History   Tobacco Use  Smoking Status Every Day   Current packs/day: 1.00   Average packs/day: 1 pack/day for 30.9 years (30.9 ttl pk-yrs)   Types: Cigarettes   Start date: 01/03/1993  Smokeless Tobacco Never    Labs: Review Flowsheet  More data exists      Latest Ref Rng & Units 09/24/2022 12/31/2022 04/08/2023 07/08/2023 10/14/2023  Labs for ITP Cardiac and Pulmonary Rehab  Cholestrol 100 - 199 mg/dL - 891  899  - 897   LDL (calc) 0 - 99 mg/dL - 53  48  - 51  HDL-C >39 mg/dL - 34  31  - 34   Trlycerides 0 - 149 mg/dL - 883  886  - 87   Hemoglobin A1c 4.8 - 5.6 % 5.7  5.3  5.5  5.5  5.5      Exercise Target Goals: Exercise Program Goal: Individual exercise prescription set using results from initial 6 min walk test and THRR while considering  patient's activity barriers and safety.   Exercise Prescription Goal: Initial exercise prescription builds to 30-45 minutes a day of aerobic activity, 2-3 days per week.  Home exercise guidelines will be given to patient during program as part of exercise prescription that the participant will acknowledge.   Education: Aerobic Exercise: - Group verbal and visual presentation on the components of exercise prescription. Introduces F.I.T.T  principle from ACSM for exercise prescriptions.  Reviews F.I.T.T. principles of aerobic exercise including progression. Written material provided at class time.   Education: Resistance Exercise: - Group verbal and visual presentation on the components of exercise prescription. Introduces F.I.T.T principle from ACSM for exercise prescriptions  Reviews F.I.T.T. principles of resistance exercise including progression. Written material provided at class time.    Education: Exercise & Equipment Safety: - Individual verbal instruction and demonstration of equipment use and safety with use of the equipment.   Education: Exercise Physiology & General Exercise Guidelines: - Group verbal and written instruction with models to review the exercise physiology of the cardiovascular system and associated critical values. Provides general exercise guidelines with specific guidelines to those with heart or lung disease. Written material provided at class time. Flowsheet Row CARDIAC REHAB PHASE II ORIENTATION from 11/15/2023 in [place] CARDIAC REHABILITATION  Education need identified 11/15/23    Education: Flexibility, Balance, Mind/Body Relaxation: - Group verbal and visual presentation with interactive activity on the components of exercise prescription. Introduces F.I.T.T principle from ACSM for exercise prescriptions. Reviews F.I.T.T. principles of flexibility and balance exercise training including progression. Also discusses the mind body connection.  Reviews various relaxation techniques to help reduce and manage stress (i.e. Deep breathing, progressive muscle relaxation, and visualization). Balance handout provided to take home. Written material provided at class time.   Activity Barriers & Risk Stratification:  Activity Barriers & Cardiac Risk Stratification - 11/12/23 1408       Activity Barriers & Cardiac Risk Stratification   Activity Barriers None    Cardiac Risk Stratification High           6 Minute Walk:  6 Minute Walk     Row Name 11/15/23 1147         6 Minute Walk   Phase Initial     Distance 1150 feet     Walk Time 6 minutes     # of Rest Breaks 0     MPH 2.18     METS 3.47     RPE 8     VO2 Peak 12.16     Symptoms No     Resting HR 97 bpm     Resting BP 110/80     Resting Oxygen Saturation  96 %     Exercise Oxygen Saturation  during 6 min walk 96 %     Max Ex. HR 111 bpm     Max Ex. BP 130/78     2 Minute Post BP 120/78        Oxygen Initial Assessment:   Oxygen Re-Evaluation:   Oxygen Discharge (Final Oxygen Re-Evaluation):   Initial Exercise Prescription:  Initial Exercise Prescription -  11/15/23 1100       Date of Initial Exercise RX and Referring Provider   Date 11/15/23    Referring Provider Anner Lenis MD   Alvan Carrier MD sending everything     Treadmill   MPH 1.8    Grade 0.5    Minutes 15    METs 2.5      NuStep   Level 5    SPM 50    Minutes 15    METs 2      Prescription Details   Frequency (times per week) 2    Duration Progress to 30 minutes of continuous aerobic without signs/symptoms of physical distress      Intensity   THRR 40-80% of Max Heartrate 126-156    Ratings of Perceived Exertion 11-13    Perceived Dyspnea 0-4      Resistance Training   Training Prescription Yes    Weight 5    Reps 10-15          Perform Capillary Blood Glucose checks as needed.  Exercise Prescription Changes:   Exercise Prescription Changes     Row Name 11/15/23 1100             Response to Exercise   Blood Pressure (Admit) 110/80       Blood Pressure (Exercise) 130/78       Blood Pressure (Exit) 120/78       Heart Rate (Admit) 97 bpm       Heart Rate (Exercise) 111 bpm       Heart Rate (Exit) 85 bpm       Oxygen Saturation (Admit) 96 %       Oxygen Saturation (Exercise) 96 %       Oxygen Saturation (Exit) 96 %       Rating of Perceived Exertion (Exercise) 8          Exercise  Comments:   Exercise Goals and Review:   Exercise Goals     Row Name 11/15/23 1151             Exercise Goals   Increase Physical Activity Yes       Intervention Provide advice, education, support and counseling about physical activity/exercise needs.;Develop an individualized exercise prescription for aerobic and resistive training based on initial evaluation findings, risk stratification, comorbidities and participant's personal goals.       Expected Outcomes Short Term: Attend rehab on a regular basis to increase amount of physical activity.;Long Term: Add in home exercise to make exercise part of routine and to increase amount of physical activity.;Long Term: Exercising regularly at least 3-5 days a week.       Increase Strength and Stamina Yes       Intervention Provide advice, education, support and counseling about physical activity/exercise needs.;Develop an individualized exercise prescription for aerobic and resistive training based on initial evaluation findings, risk stratification, comorbidities and participant's personal goals.       Expected Outcomes Short Term: Increase workloads from initial exercise prescription for resistance, speed, and METs.;Short Term: Perform resistance training exercises routinely during rehab and add in resistance training at home;Long Term: Improve cardiorespiratory fitness, muscular endurance and strength as measured by increased METs and functional capacity ( )       Able to understand and use rate of perceived exertion (RPE) scale Yes       Intervention Provide education and explanation on how to use RPE scale       Expected Outcomes Short Term:  Able to use RPE daily in rehab to express subjective intensity level;Long Term:  Able to use RPE to guide intensity level when exercising independently       Able to understand and use Dyspnea scale Yes       Intervention Provide education and explanation on how to use Dyspnea scale       Expected  Outcomes Short Term: Able to use Dyspnea scale daily in rehab to express subjective sense of shortness of breath during exertion;Long Term: Able to use Dyspnea scale to guide intensity level when exercising independently       Knowledge and understanding of Target Heart Rate Range (THRR) Yes       Intervention Provide education and explanation of THRR including how the numbers were predicted and where they are located for reference       Expected Outcomes Long Term: Able to use THRR to govern intensity when exercising independently;Short Term: Able to state/look up THRR;Short Term: Able to use daily as guideline for intensity in rehab       Able to check pulse independently Yes       Intervention Provide education and demonstration on how to check pulse in carotid and radial arteries.;Review the importance of being able to check your own pulse for safety during independent exercise       Expected Outcomes Short Term: Able to explain why pulse checking is important during independent exercise;Long Term: Able to check pulse independently and accurately       Understanding of Exercise Prescription Yes       Intervention Provide education, explanation, and written materials on patient's individual exercise prescription       Expected Outcomes Short Term: Able to explain program exercise prescription;Long Term: Able to explain home exercise prescription to exercise independently          Exercise Goals Re-Evaluation :   Discharge Exercise Prescription (Final Exercise Prescription Changes):  Exercise Prescription Changes - 11/15/23 1100       Response to Exercise   Blood Pressure (Admit) 110/80    Blood Pressure (Exercise) 130/78    Blood Pressure (Exit) 120/78    Heart Rate (Admit) 97 bpm    Heart Rate (Exercise) 111 bpm    Heart Rate (Exit) 85 bpm    Oxygen Saturation (Admit) 96 %    Oxygen Saturation (Exercise) 96 %    Oxygen Saturation (Exit) 96 %    Rating of Perceived Exertion  (Exercise) 8          Nutrition:  Target Goals: Understanding of nutrition guidelines, daily intake of sodium 1500mg , cholesterol 200mg , calories 30% from fat and 7% or less from saturated fats, daily to have 5 or more servings of fruits and vegetables.  Education: Nutrition 1 -Group instruction provided by verbal, written material, interactive activities, discussions, models, and posters to present general guidelines for heart healthy nutrition including macronutrients, label reading, and promoting whole foods over processed counterparts. Education serves as Pensions consultant of discussion of heart healthy eating for all. Written material provided at class time. Flowsheet Row CARDIAC REHAB PHASE II ORIENTATION from 11/15/2023 in [place] CARDIAC REHABILITATION  Education need identified 11/15/23     Education: Nutrition 2 -Group instruction provided by verbal, written material, interactive activities, discussions, models, and posters to present general guidelines for heart healthy nutrition including sodium, cholesterol, and saturated fat. Providing guidance of habit forming to improve blood pressure, cholesterol, and body weight. Written material provided at class time. Flowsheet Row CARDIAC  REHAB PHASE II ORIENTATION from 11/15/2023 in [facility] CARDIAC REHABILITATION  Education need identified 11/15/23      Biometrics:  Pre Biometrics - 11/15/23 1152       Pre Biometrics   Height 5' 11 (1.803 m)    Weight 113.2 kg    Waist Circumference 46 inches    Hip Circumference 43 inches    Waist to Hip Ratio 1.07 %    BMI (Calculated) 34.82    Grip Strength 35.6 kg    Single Leg Stand 30 seconds           Nutrition Therapy Plan and Nutrition Goals:   Nutrition Assessments:  MEDIFICTS Score Key: >=70 Need to make dietary changes  40-70 Heart Healthy Diet <= 40 Therapeutic Level Cholesterol Diet  Flowsheet Row CARDIAC REHAB PHASE II ORIENTATION from 11/15/2023 in [facility]  CARDIAC REHABILITATION  Picture Your Plate Total Score on Admission 57   Picture Your Plate Scores: <59 Unhealthy dietary pattern with much room for improvement. 41-50 Dietary pattern unlikely to meet recommendations for good health and room for improvement. 51-60 More healthful dietary pattern, with some room for improvement.  >60 Healthy dietary pattern, although there may be some specific behaviors that could be improved.    Nutrition Goals Re-Evaluation:   Nutrition Goals Discharge (Final Nutrition Goals Re-Evaluation):   Psychosocial: Target Goals: Acknowledge presence or absence of significant depression and/or stress, maximize coping skills, provide positive support system. Participant is able to verbalize types and ability to use techniques and skills needed for reducing stress and depression.   Education: Stress, Anxiety, and Depression - Group verbal and visual presentation to define topics covered.  Reviews how body is impacted by stress, anxiety, and depression.  Also discusses healthy ways to reduce stress and to treat/manage anxiety and depression. Written material provided at class time. Flowsheet Row CARDIAC REHAB PHASE II ORIENTATION from 11/15/2023 in [place] CARDIAC REH[facility]ILITATION  Education need identified 11/15/23    Education: Sleep Hygiene -Provides group verbal and written instruction about how sleep can affect your health.  Define sleep hygiene, discuss sleep cycles and impact of sleep habits. Review good sleep hygiene tips.   Initial Review & Psychosocial Screening:  Initial Psych Review & Screening - 11/12/23 1417       Initial Review   Current issues with Current Anxiety/Panic;History of Depression;Current Depression      Family Dynamics   Good Support System? No    Concerns No support system    Comments When ask who supports him, he states no one.      Barriers   Psychosocial barriers to participate in program The patient should benefit from  training in stress management and relaxation.;There are no identifiable barriers or psychosocial needs.      Screening Interventions   Interventions To provide support and resources with identified psychosocial needs;Provide feedback about the scores to participant;Encouraged to exercise    Expected Outcomes Short Term goal: Utilizing psychosocial counselor, staff and physician to assist with identification of specific Stressors or current issues interfering with healing process. Setting desired goal for each stressor or current issue identified.;Long Term Goal: Stressors or current issues are controlled or eliminated.;Short Term goal: Identification and review with participant of any Quality of Life or Depression concerns found by scoring the questionnaire.;Long Term goal: The participant improves quality of Life and PHQ9 Scores as seen by post scores and/or verbalization of changes          Quality of Life Scores:  Scores of 19 and below usually indicate a poorer quality of life in these areas.  A difference of  2-3 points is a clinically meaningful difference.  A difference of 2-3 points in the total score of the Quality of Life Index has been associated with significant improvement in overall quality of life, self-image, physical symptoms, and general health in studies assessing change in quality of life.  PHQ-9: Review Flowsheet  More data exists      11/15/2023 10/02/2021 06/26/2021 04/03/2021 01/02/2021  Depression screen PHQ 2/9  Decreased Interest 3 3 3 3 3   Down, Depressed, Hopeless 2 3 3 3 3   PHQ - 2 Score 5 6 6 6 6   Altered sleeping 0 3 3 3 3   Tired, decreased energy 0 3 3 3 3   Change in appetite 0 3 3 3 3   Feeling bad or failure about yourself  3 3 3 3 3   Trouble concentrating 0 3 3 3 3   Moving slowly or fidgety/restless 0 3 3 3 3   Suicidal thoughts 0 3 3 3 3   PHQ-9 Score 8 27 27 27 27   Difficult doing work/chores Not difficult at all - Extremely dIfficult - -    Interpretation of Total Score  Total Score Depression Severity:  1-4 = Minimal depression, 5-9 = Mild depression, 10-14 = Moderate depression, 15-19 = Moderately severe depression, 20-27 = Severe depression   Psychosocial Evaluation and Intervention:  Psychosocial Evaluation - 11/12/23 1418       Psychosocial Evaluation & Interventions   Interventions Stress management education;Relaxation education;Encouraged to exercise with the program and follow exercise prescription    Comments Patient referred to cardiac rehab with NSTEMI/Stent placement. He has depression and anxiety listed in his medical history but says he has no more than his usual. He is currently not being treated. He lives alone and says he has no support person. 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 Olivia Pavy, PA put lifting restrictions of no  more than 10 lbs at his visit and he said he could not work with these restrictions. He is a current smoker 0.5 ppd and vapes also and is not ready to quit. His goals for the program are to be able to return to work. He says he will still be able to come to sessions after he returns to work. He has no barriers identified to complete the program.    Expected Outcomes Short Term: Patient will start the program and attend consistently. Long Term: Patient will complete the program meeting personal goals.    Continue Psychosocial Services  Follow up required by staff          Psychosocial Re-Evaluation:   Psychosocial Discharge (Final Psychosocial Re-Evaluation):   Vocational Rehabilitation: Provide vocational rehab assistance to qualifying candidates.   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           Education: Education Goals: Education classes will be provided on a variety of topics geared toward better understanding of heart health and risk factor modification. Participant will state understanding/return demonstration of topics presented as noted by education test scores.  Learning Barriers/Preferences:  Learning Barriers/Preferences - 11/12/23 1416  Learning Barriers/Preferences   Learning Barriers None    Learning Preferences Written Material;Audio;Skilled Demonstration          General Cardiac Education Topics:  AED/CPR: - Group verbal and written instruction with the use of models to demonstrate the basic use of the AED with the basic ABC's of resuscitation.   Test and Procedures: - Group verbal and visual presentation and models provide information about basic cardiac anatomy and function. Reviews the testing methods done to diagnose heart disease and the outcomes of the test results. Describes the treatment choices: Medical Management, Angioplasty, or Coronary Bypass Surgery for treating various heart conditions including Myocardial Infarction, Angina, Valve Disease, and Cardiac Arrhythmias. Written material provided at class time.   Medication Safety: - Group verbal and visual instruction to review commonly prescribed medications for heart and lung disease. Reviews the medication, class of the drug, and side effects. Includes the steps to properly store meds and maintain the prescription regimen. Written material provided at class time.   Intimacy: - Group verbal instruction through game format to discuss how heart and lung disease can affect sexual intimacy. Written material provided at class time.   Know Your Numbers and Heart Failure: - Group verbal and visual instruction to discuss disease risk factors for cardiac and pulmonary disease and treatment options.  Reviews associated critical values for Overweight/Obesity, Hypertension, Cholesterol, and  Diabetes.  Discusses basics of heart failure: signs/symptoms and treatments.  Introduces Heart Failure Zone chart for action plan for heart failure. Written material provided at class time.   Infection Prevention: - Provides verbal and written material to individual with discussion of infection control including proper hand washing and proper equipment cleaning during exercise session.   Falls Prevention: - Provides verbal and written material to individual with discussion of falls prevention and safety.   Other: -Provides group and verbal instruction on various topics (see comments)   Knowledge Questionnaire Score:   Core Components/Risk Factors/Patient Goals at Admission:  Personal Goals and Risk Factors at Admission - 11/12/23 1416       Core Components/Risk Factors/Patient Goals on Admission    Weight Management Obesity    Diabetes Yes    Intervention Provide education about signs/symptoms and action to take for hypo/hyperglycemia.;Provide education about proper nutrition, including hydration, and aerobic/resistive exercise prescription along with prescribed medications to achieve blood glucose in normal ranges: Fasting glucose 65-99 mg/dL    Expected Outcomes Short Term: Participant verbalizes understanding of the signs/symptoms and immediate care of hyper/hypoglycemia, proper foot care and importance of medication, aerobic/resistive exercise and nutrition plan for blood glucose control.;Long Term: Attainment of HbA1C < 7%.    Hypertension Yes    Intervention Provide education on lifestyle modifcations including regular physical activity/exercise, weight management, moderate sodium restriction and increased consumption of fresh fruit, vegetables, and low fat dairy, alcohol moderation, and smoking cessation.;Monitor prescription use compliance.    Expected Outcomes Short Term: Continued assessment and intervention until BP is < 140/70mm HG in hypertensive participants. < 130/43mm HG  in hypertensive participants with diabetes, heart failure or chronic kidney disease.;Long Term: Maintenance of blood pressure at goal levels.    Lipids Yes    Intervention Provide education and support for participant on nutrition & aerobic/resistive exercise along with prescribed medications to achieve LDL 70mg , HDL >40mg .    Expected Outcomes Short Term: Participant states understanding of desired cholesterol values and is compliant with medications prescribed. Participant is following exercise prescription and nutrition guidelines.;Long Term: Cholesterol controlled with medications as prescribed,  with individualized exercise RX and with personalized nutrition plan. Value goals: LDL < 70mg , HDL > 40 mg.          Education:Diabetes - Individual verbal and written instruction to review signs/symptoms of diabetes, desired ranges of glucose level fasting, after meals and with exercise. Acknowledge that pre and post exercise glucose checks will be done for 3 sessions at entry of program.   Core Components/Risk Factors/Patient Goals Review:    Core Components/Risk Factors/Patient Goals at Discharge (Final Review):    ITP Comments:  ITP Comments     Row Name 11/12/23 1429 11/15/23 1157         ITP Comments Virtual orientation visit completed for cardiac rehab with NSTEMI/Stent placement. On-site orientation visit scheduled for 11/15/23 at 10:30. Patient arrived for 1st visit/orientation/education at 1030. Patient was referred to CR by Alm Harding/Dr. Branch attending due to NSTEMI/Stent placement. During orientation advised patient on arrival and appointment times what to wear, what to do before, during and after exercise. Reviewed attendance and class policy.  Pt is scheduled to return Cardiac Rehab on 11/16/23 at 1500. Pt was advised to come to class 15 minutes before class starts.  Discussed RPE/Dpysnea scales. Patient participated in warm up stretches. Patient was able to complete 6 minute  walk test.  Telemetry:NSR. Patient was measured for the equipment. Discussed equipment safety with patient. Took patient pre-anthropometric measurements. Patient finished visit at 1145.         Comments: Patient arrived for 1st visit/orientation/education at 1030. Patient was referred to CR by Alm Harding/Dr. Branch attending due to NSTEMI/Stent placement. During orientation advised patient on arrival and appointment times what to wear, what to do before, during and after exercise. Reviewed attendance and class policy.  Pt is scheduled to return Cardiac Rehab on 11/16/23 at 1500. Pt was advised to come to class 15 minutes before class starts.  Discussed RPE/Dpysnea scales. Patient participated in warm up stretches. Patient was able to complete 6 minute walk test.  Telemetry:NSR. Patient was measured for the equipment. Discussed equipment safety with patient. Took patient pre-anthropometric measurements. Patient finished visit at 1145.

## 2023-11-15 NOTE — Telephone Encounter (Signed)
 Pt came by and dropped off more forms for Dr.Branch.  Interoffice them to malanie in [employer] as Dr.Branch is not here this week Scanned into chart as well

## 2023-11-15 NOTE — Patient Instructions (Signed)
 Patient Instructions  Patient Details  Name: Carl [name] MRN: [id number] Date of Birth: [birth year] Referring Provider:  Anner Alm ORN, MD  Below are your personal goals for exercise, nutrition, and risk factors. Our goal is to help you stay on track towards obtaining and maintaining these goals. We will be discussing your progress on these goals with you throughout the program.  Initial Exercise Prescription:  Initial Exercise Prescription - 11/15/23 1100       Date of Initial Exercise RX and Referring Provider   Date 11/15/23    Referring Provider Anner Alm MD   Alvan Carrier MD sending everything     Treadmill   MPH 1.8    Grade 0.5    Minutes 15    METs 2.5      NuStep   Level 5    SPM 50    Minutes 15    METs 2      Prescription Details   Frequency (times per week) 2    Duration Progress to 30 minutes of continuous aerobic without signs/symptoms of physical distress      Intensity   THRR 40-80% of Max Heartrate 126-156    Ratings of Perceived Exertion 11-13    Perceived Dyspnea 0-4      Resistance Training   Training Prescription Yes    Weight 5    Reps 10-15          Exercise Goals: Frequency: Be able to perform aerobic exercise two to three times per week in program working toward 2-5 days per week of home exercise.  Intensity: Work with a perceived exertion of 11 (fairly light) - 15 (hard) while following your exercise prescription.  We will make changes to your prescription with you as you progress through the program.   Duration: Be able to do 30 to 45 minutes of continuous aerobic exercise in addition to a 5 minute warm-up and a 5 minute cool-down routine.   Nutrition Goals: Your personal nutrition goals will be established when you do your nutrition analysis with the dietician.  The following are general nutrition guidelines to follow: Cholesterol < 200mg /day Sodium < 1500mg /day Fiber: Men under 50 yrs - 38 grams per day  Personal  Goals:  Personal Goals and Risk Factors at Admission - 11/12/23 1416       Core Components/Risk Factors/Patient Goals on Admission    Weight Management Obesity    Diabetes Yes    Intervention Provide education about signs/symptoms and action to take for hypo/hyperglycemia.;Provide education about proper nutrition, including hydration, and aerobic/resistive exercise prescription along with prescribed medications to achieve blood glucose in normal ranges: Fasting glucose 65-99 mg/dL    Expected Outcomes Short Term: Participant verbalizes understanding of the signs/symptoms and immediate care of hyper/hypoglycemia, proper foot care and importance of medication, aerobic/resistive exercise and nutrition plan for blood glucose control.;Long Term: Attainment of HbA1C < 7%.    Hypertension Yes    Intervention Provide education on lifestyle modifcations including regular physical activity/exercise, weight management, moderate sodium restriction and increased consumption of fresh fruit, vegetables, and low fat dairy, alcohol moderation, and smoking cessation.;Monitor prescription use compliance.    Expected Outcomes Short Term: Continued assessment and intervention until BP is < 140/3mm HG in hypertensive participants. < 130/66mm HG in hypertensive participants with diabetes, heart failure or chronic kidney disease.;Long Term: Maintenance of blood pressure at goal levels.    Lipids Yes    Intervention Provide education and support for participant on nutrition & aerobic/resistive  exercise along with prescribed medications to achieve LDL 70mg , HDL >40mg .    Expected Outcomes Short Term: Participant states understanding of desired cholesterol values and is compliant with medications prescribed. Participant is following exercise prescription and nutrition guidelines.;Long Term: Cholesterol controlled with medications as prescribed, with individualized exercise RX and with personalized nutrition plan. Value goals:  LDL < 70mg , HDL > 40 mg.          Tobacco Use Initial Evaluation: Social History   Tobacco Use  Smoking Status Every Day   Current packs/day: 1.00   Average packs/day: 1 pack/day for 30.9 years (30.9 ttl pk-yrs)   Types: Cigarettes   Start date: 01/03/1993  Smokeless Tobacco Never    Exercise Goals and Review:  Exercise Goals     Row Name 11/15/23 1151             Exercise Goals   Increase Physical Activity Yes       Intervention Provide advice, education, support and counseling about physical activity/exercise needs.;Develop an individualized exercise prescription for aerobic and resistive training based on initial evaluation findings, risk stratification, comorbidities and participant's personal goals.       Expected Outcomes Short Term: Attend rehab on a regular basis to increase amount of physical activity.;Long Term: Add in home exercise to make exercise part of routine and to increase amount of physical activity.;Long Term: Exercising regularly at least 3-5 days a week.       Increase Strength and Stamina Yes       Intervention Provide advice, education, support and counseling about physical activity/exercise needs.;Develop an individualized exercise prescription for aerobic and resistive training based on initial evaluation findings, risk stratification, comorbidities and participant's personal goals.       Expected Outcomes Short Term: Increase workloads from initial exercise prescription for resistance, speed, and METs.;Short Term: Perform resistance training exercises routinely during rehab and add in resistance training at home;Long Term: Improve cardiorespiratory fitness, muscular endurance and strength as measured by increased METs and functional capacity ( )       Able to understand and use rate of perceived exertion (RPE) scale Yes       Intervention Provide education and explanation on how to use RPE scale       Expected Outcomes Short Term: Able to use RPE  daily in rehab to express subjective intensity level;Long Term:  Able to use RPE to guide intensity level when exercising independently       Able to understand and use Dyspnea scale Yes       Intervention Provide education and explanation on how to use Dyspnea scale       Expected Outcomes Short Term: Able to use Dyspnea scale daily in rehab to express subjective sense of shortness of breath during exertion;Long Term: Able to use Dyspnea scale to guide intensity level when exercising independently       Knowledge and understanding of Target Heart Rate Range (THRR) Yes       Intervention Provide education and explanation of THRR including how the numbers were predicted and where they are located for reference       Expected Outcomes Long Term: Able to use THRR to govern intensity when exercising independently;Short Term: Able to state/look up THRR;Short Term: Able to use daily as guideline for intensity in rehab       Able to check pulse independently Yes       Intervention Provide education and demonstration on how to check pulse in carotid and radial  arteries.;Review the importance of being able to check your own pulse for safety during independent exercise       Expected Outcomes Short Term: Able to explain why pulse checking is important during independent exercise;Long Term: Able to check pulse independently and accurately       Understanding of Exercise Prescription Yes       Intervention Provide education, explanation, and written materials on patient's individual exercise prescription       Expected Outcomes Short Term: Able to explain program exercise prescription;Long Term: Able to explain home exercise prescription to exercise independently          Copy of goals given to participant.

## 2023-11-16 ENCOUNTER — Encounter (HOSPITAL_COMMUNITY)
Admission: RE | Admit: 2023-11-16 | Discharge: 2023-11-16 | Disposition: A | Discharge status: Home / Self Care | Source: Ambulatory Visit | Attending: Cardiology | Admitting: Cardiology

## 2023-11-16 DIAGNOSIS — I214 Non-ST elevation (NSTEMI) myocardial infarction: Secondary | ICD-10-CM | POA: Diagnosis present

## 2023-11-16 DIAGNOSIS — Z955 Presence of coronary angioplasty implant and graft: Secondary | ICD-10-CM | POA: Diagnosis present

## 2023-11-16 NOTE — Progress Notes (Signed)
 Daily Session Note  Patient Details  Name: Carl [name] MRN: [id number] Date of Birth: [birth year] Referring Provider:   Flowsheet Row CARDIAC REHAB PHASE II ORIENTATION from 11/15/2023 in [facility] C[facility]AC RE[facility]  Referring Provider Anner Lenis MD  Suzzette Carrier MD sending everything]    Encounter Date: 11/16/2023  Check In:  Session Check In - 11/16/23 1450       Check-In   Supervising physician immediately available to respond to emergencies See telemetry face sheet for immediately available MD    Location [facility]    Staff Present Powell Benders, BS, Exercise Physiologist;Laureen Delores, BS, RRT, CPFT;Victoria Zina, RN;Brittany Jackquline, BSN, RN, WTA-C    Virtual Visit No    Medication changes reported     No    Fall or balance concerns reported    No    Tobacco Cessation No Change    Warm-up and Cool-down Performed on first and last piece of equipment    Resistance Training Performed Yes    VAD Patient? No    PAD/SET Patient? No      Pain Assessment   Currently in Pain? No/denies    Multiple Pain Sites No          Capillary Blood Glucose: No results found for this or any previous visit (from the past 24 hours).    Social History   Tobacco Use  Smoking Status Every Day   Current packs/day: 1.00   Average packs/day: 1 pack/day for 30.9 years (30.9 ttl pk-yrs)   Types: Cigarettes   Start date: 01/03/1993  Smokeless Tobacco Never    Goals Met:  Independence with exercise equipment Exercise tolerated well No report of concerns or symptoms today Strength training completed today  Goals Unmet:  Not Applicable  Comments: First full day of exercise!  Patient was oriented to gym and equipment including functions, settings, policies, and procedures.  Patient's individual exercise prescription and treatment plan were reviewed.  All starting workloads were established based on the results of the 6 minute walk test done at initial  orientation visit.  The plan for exercise progression was also introduced and progression will be customized based on patient's performance and goals.

## 2023-11-16 NOTE — Telephone Encounter (Signed)
 Pt came in with more forms for Dr.Brach to fill out from [employer]. Faxed to [place] office. Informed Malanie

## 2023-11-17 NOTE — Telephone Encounter (Signed)
 Received forms put them on Branch desk in [place]  [facility] 11/17/2023

## 2023-11-18 ENCOUNTER — Encounter (HOSPITAL_COMMUNITY)
Admission: RE | Admit: 2023-11-18 | Discharge: 2023-11-18 | Disposition: A | Discharge status: Home / Self Care | Source: Ambulatory Visit | Attending: Cardiology | Admitting: Cardiology

## 2023-11-18 DIAGNOSIS — I214 Non-ST elevation (NSTEMI) myocardial infarction: Secondary | ICD-10-CM

## 2023-11-18 DIAGNOSIS — Z955 Presence of coronary angioplasty implant and graft: Secondary | ICD-10-CM

## 2023-11-18 NOTE — Progress Notes (Signed)
 Daily Session Note  Patient Details  Name: Carl [name] MRN: [id number] Date of Birth: April 22, 1975 Referring Provider:   Flowsheet Row CARDIAC REHAB PHASE II ORIENTATION from 11/15/2023 in [facility] CARDIAC RE[facility]  Referring Provider Anner Lenis MD  Suzzette Carrier MD sending everything]    Encounter Date: 11/18/2023  Check In:  Session Check In - 11/18/23 1443       Check-In   Supervising physician immediately available to respond to emergencies See telemetry face sheet for immediately available MD    Location [facility]    Staff Present Powell Benders, BS, Exercise Physiologist;Jessica Vonzell, MA, RCEP, CCRP, CCET    Virtual Visit No    Medication changes reported     No    Fall or balance concerns reported    No    Tobacco Cessation No Change    Warm-up and Cool-down Performed on first and last piece of equipment    Resistance Training Performed Yes    VAD Patient? No    PAD/SET Patient? No      Pain Assessment   Currently in Pain? No/denies    Pain Score 0-No pain    Multiple Pain Sites No          Capillary Blood Glucose: No results found for this or any previous visit (from the past 24 hours).    Social History   Tobacco Use  Smoking Status Every Day   Current packs/day: 1.00   Average packs/day: 1 pack/day for 30.9 years (30.9 ttl pk-yrs)   Types: Cigarettes   Start date: 01/03/1993  Smokeless Tobacco Never    Goals Met:  Independence with exercise equipment Exercise tolerated well No report of concerns or symptoms today Strength training completed today  Goals Unmet:  Not Applicable  Comments: Pt able to follow exercise prescription today without complaint.  Will continue to monitor for progression.

## 2023-11-19 ENCOUNTER — Encounter: Payer: Self-pay | Admitting: Cardiology

## 2023-11-19 NOTE — Telephone Encounter (Signed)
 Pt came in this morning asking for update. Reached out to D. Branch he said that he will complete later today calling to inform pt!

## 2023-11-22 ENCOUNTER — Other Ambulatory Visit: Payer: Self-pay | Admitting: Cardiology

## 2023-11-22 NOTE — Telephone Encounter (Signed)
 Pt came in 9/29 Dropped off new form and asked for update Dr.Branch is currently working on forms

## 2023-11-22 NOTE — Telephone Encounter (Signed)
 Patient picked up FMLA forms for [employer]. Forms faxed to [phone].Billing form faxed to billing department [phone])

## 2023-11-23 ENCOUNTER — Encounter (HOSPITAL_COMMUNITY)
Admission: RE | Admit: 2023-11-23 | Discharge: 2023-11-23 | Disposition: A | Discharge status: Home / Self Care | Source: Ambulatory Visit | Attending: Cardiology | Admitting: Cardiology

## 2023-11-23 DIAGNOSIS — I214 Non-ST elevation (NSTEMI) myocardial infarction: Secondary | ICD-10-CM

## 2023-11-23 DIAGNOSIS — Z955 Presence of coronary angioplasty implant and graft: Secondary | ICD-10-CM

## 2023-11-23 NOTE — Progress Notes (Signed)
 Daily Session Note  Patient Details  Name: Carl [name] MRN: [id number] Date of Birth: [birth year] Referring Provider:   Flowsheet Row CARDIAC REHAB PHASE II ORIENTATION from 11/15/2023 in [facility] CARDIAC REH[facility]ILIT[facility]  Referring Provider Anner Lenis MD  Suzzette Carrier MD sending everything]    Encounter Date: 11/23/2023  Check In:  Session Check In - 11/23/23 1454       Check-In   Supervising physician immediately available to respond to emergencies See telemetry face sheet for immediately available MD    Location [facility]    Staff Present Powell Benders, BS, Exercise Physiologist;Jessica Vonzell, MA, RCEP, CCRP, CCET;Paxon Propes Jackquline, BSN, RN, WTA-C;Victoria Zina, RN    Virtual Visit No    Medication changes reported     No    Fall or balance concerns reported    No    Tobacco Cessation No Change    Warm-up and Cool-down Performed on first and last piece of equipment    Resistance Training Performed Yes    VAD Patient? No    PAD/SET Patient? No      Pain Assessment   Currently in Pain? No/denies          Capillary Blood Glucose: No results found for this or any previous visit (from the past 24 hours).    Social History   Tobacco Use  Smoking Status Every Day   Current packs/day: 1.00   Average packs/day: 1 pack/day for 30.9 years (30.9 ttl pk-yrs)   Types: Cigarettes   Start date: 01/03/1993  Smokeless Tobacco Never    Goals Met:  Independence with exercise equipment Exercise tolerated well No report of concerns or symptoms today Strength training completed today  Goals Unmet:  Not Applicable  Comments: Pt able to follow exercise prescription today without complaint.  Will continue to monitor for progression.

## 2023-11-25 ENCOUNTER — Encounter (HOSPITAL_COMMUNITY)
Admission: RE | Admit: 2023-11-25 | Discharge: 2023-11-25 | Disposition: A | Discharge status: Home / Self Care | Source: Ambulatory Visit | Attending: Cardiology | Admitting: Cardiology

## 2023-11-25 DIAGNOSIS — Z955 Presence of coronary angioplasty implant and graft: Secondary | ICD-10-CM | POA: Diagnosis present

## 2023-11-25 DIAGNOSIS — I214 Non-ST elevation (NSTEMI) myocardial infarction: Secondary | ICD-10-CM | POA: Diagnosis present

## 2023-11-25 NOTE — Telephone Encounter (Signed)
 Pt picked up forms. [employer] sent more paper work for records. Vicky faxed record request to record dept

## 2023-11-25 NOTE — Progress Notes (Signed)
 Daily Session Note  Patient Details  Name: Carl [name] MRN: [id number] Date of Birth: [birth year] Referring Provider:   Flowsheet Row CARDIAC REHAB PHASE II ORIENTATION from 11/15/2023 in [facility] CARDIAC REHABILIT[facility]  Referring Provider Anner Lenis MD  Suzzette Carrier MD sending everything]    Encounter Date: 11/25/2023  Check In:  Session Check In - 11/25/23 1433       Check-In   Supervising physician immediately available to respond to emergencies See telemetry face sheet for immediately available MD    Location [facility]    Staff Present Powell Benders, BS, Exercise Physiologist;Hillary Am[place] BSN, RN;Debra Vicci, RN, BSN    Virtual Visit No    Medication changes reported     No    Fall or balance concerns reported    No    Tobacco Cessation No Change    Warm-up and Cool-down Performed on first and last piece of equipment    Resistance Training Performed Yes    VAD Patient? No    PAD/SET Patient? No      Pain Assessment   Currently in Pain? No/denies    Pain Score 0-No pain    Multiple Pain Sites No          Capillary Blood Glucose: No results found for this or any previous visit (from the past 24 hours).    Social History   Tobacco Use  Smoking Status Every Day   Current packs/day: 1.00   Average packs/day: 1 pack/day for 30.9 years (30.9 ttl pk-yrs)   Types: Cigarettes   Start date: 01/03/1993  Smokeless Tobacco Never    Goals Met:  Independence with exercise equipment Exercise tolerated well No report of concerns or symptoms today Strength training completed today  Goals Unmet:  Not Applicable  Comments: Pt able to follow exercise prescription today without complaint.  Will continue to monitor for progression.

## 2023-11-25 NOTE — Telephone Encounter (Signed)
 Branch completed all forms and gave to vicky. Forms have been scanned into chart as well as faxed.  Called pt to let him know they are ready for pick up

## 2023-11-29 ENCOUNTER — Telehealth: Payer: Self-pay | Admitting: Pharmacy Technician

## 2023-11-29 MED ORDER — METOPROLOL SUCCINATE ER 50 MG PO TB24: ORAL_TABLET | ORAL | 3 refills | Status: AC

## 2023-11-29 NOTE — Addendum Note (Signed)
 Addended by: LEIGH JON MATSU on: 11/29/2023 11:17 AM   Modules accepted: Orders

## 2023-11-29 NOTE — Telephone Encounter (Signed)
 Done

## 2023-11-29 NOTE — Telephone Encounter (Signed)
 Hi, [facility] is asking for a refill for metoprolol  succ er 50mg  for this patient please and thank you

## 2023-11-30 ENCOUNTER — Encounter (HOSPITAL_COMMUNITY)

## 2023-12-01 ENCOUNTER — Encounter (HOSPITAL_COMMUNITY): Payer: Self-pay | Admitting: *Deleted

## 2023-12-01 ENCOUNTER — Ambulatory Visit: Payer: Self-pay | Admitting: Cardiology

## 2023-12-01 DIAGNOSIS — I214 Non-ST elevation (NSTEMI) myocardial infarction: Secondary | ICD-10-CM

## 2023-12-01 DIAGNOSIS — Z955 Presence of coronary angioplasty implant and graft: Secondary | ICD-10-CM

## 2023-12-01 NOTE — Progress Notes (Signed)
 Cardiac Individual Treatment Plan  Patient Details  Name: Carl [name] MRN: [id number] Date of Birth: Aug 05, 1975 Referring Provider:   Flowsheet Row CARDIAC REHAB PHASE II ORIENTATION from 11/15/2023 in [facility] CARDIAC REHABILITATION  Referring Provider Anner Lenis MD  Suzzette Carrier MD sending everything]    Initial Encounter Date:  Flowsheet Row CARDIAC REHAB PHASE II ORIENTATION from 11/15/2023 in [facility] CARDIAC REHABILITATION  Date 11/15/23    Visit Diagnosis: NSTEMI (non-ST elevated myocardial infarction) [facility])  Status post coronary artery stent placement  Patient's Home Medications on Admission:  Current Outpatient Medications:    amLODipine  (NORVASC ) 2.5 MG tablet, TAKE 1 TABLET BY MOUTH DAILY, Disp: 90 tablet, Rfl: 3   Ascorbic Acid (VITAMIN C) 1000 MG tablet, Take 2,000 mg by mouth daily., Disp: , Rfl:    aspirin  EC 81 MG tablet, Take 81 mg by mouth daily. , Disp: , Rfl:    atorvastatin  (LIPITOR ) 80 MG tablet, TAKE 1 TABLET BY MOUTH DAILY, Disp: 90 tablet, Rfl: 3   b complex vitamins capsule, Take 1 capsule by mouth daily., Disp: , Rfl:    clopidogrel  (PLAVIX ) 75 MG tablet, Take 1 tablet (75 mg total) by mouth daily with breakfast., Disp: 90 tablet, Rfl: 3   empagliflozin  (JARDIANCE ) 10 MG TABS tablet, TAKE 1 TABLET DAILY BEFORE BREAKFAST, Disp: 90 tablet, Rfl: 0   lisinopril  (ZESTRIL ) 5 MG tablet, TAKE 1 TABLET BY MOUTH DAILY, Disp: 90 tablet, Rfl: 0   metoprolol  succinate (TOPROL -XL) 50 MG 24 hr tablet, TAKE 1 TABLET BY MOUTH EVERY DAY with OR immediately AFTER a meal, Disp: 90 tablet, Rfl: 3   nitroGLYCERIN  (NITROSTAT ) 0.4 MG SL tablet, Place 1 tablet (0.4 mg total) under the tongue every 5 (five) minutes x 3 doses as needed for chest pain (if no relief after 2nd dose, proceed to ED or call 911)., Disp: 25 tablet, Rfl: 3   pantoprazole  (PROTONIX ) 40 MG tablet, Take 1 tablet (40 mg total) by mouth daily., Disp: 90 tablet, Rfl: 3   tirzepatide  (MOUNJARO ) 7.5  MG/0.5ML Pen, Inject 7.5 mg into the skin once a week., Disp: 6 mL, Rfl: 3  Past Medical History: Past Medical History:  Diagnosis Date   Anxiety    CAD in native artery    a. cath in 2015 [facility] showing occluded proximal LAD, LAD filled with left-left and right-left collaterals, otherwise 20-30% prox-mid RCA, LVEF 55%. b. Abnormal nuc 02/2016 - [facility] medically. c. redo cath in 06/2017 showing 100% Prox LAD stenosis, 50% RCA, and 40% RPDA and underwent CTO PCI of the LAD with DESx2 to in 08/2017   Daily headache    since I started Plavix  (08/25/2017)   Depression    Diabetes mellitus type 2 in obese    Hyperlipidemia    Hypertension    OSA on CPAP     Tobacco Use: Social History   Tobacco Use  Smoking Status Every Day   Current packs/day: 1.00   Average packs/day: 1 pack/day for 30.9 years (30.9 ttl pk-yrs)   Types: Cigarettes   Start date: 01/03/1993  Smokeless Tobacco Never    Labs: Review Flowsheet  More data exists      Latest Ref Rng & Units 09/24/2022 12/31/2022 04/08/2023 07/08/2023 10/14/2023  Labs for ITP Cardiac and Pulmonary Rehab  Cholestrol 100 - 199 mg/dL - 891  899  - 897   LDL (calc) 0 - 99 mg/dL - 53  48  - 51   HDL-C >39 mg/dL - 34  31  - 34   Trlycerides 0 - 149 mg/dL - 883  886  - 87   Hemoglobin A1c 4.8 - 5.6 % 5.7  5.3  5.5  5.5  5.5     Capillary Blood Glucose: Lab Results  Component Value Date   GLUCAP 100 (H) 10/28/2023   GLUCAP 93 10/27/2023   GLUCAP 79 10/27/2023   GLUCAP 83 10/27/2023   GLUCAP 92 06/02/2023     Exercise Target Goals: Exercise Program Goal: Individual exercise prescription set using results from initial 6 min walk test and THRR while considering  patient's activity barriers and safety.   Exercise Prescription Goal: Starting with aerobic activity 30 plus minutes a day, 3 days per week for initial exercise prescription. Provide home exercise prescription and guidelines that participant acknowledges understanding prior to  discharge.  Activity Barriers & Risk Stratification:  Activity Barriers & Cardiac Risk Stratification - 11/12/23 1408       Activity Barriers & Cardiac Risk Stratification   Activity Barriers None    Cardiac Risk Stratification High          6 Minute Walk:  6 Minute Walk     Row Name 11/15/23 1147         6 Minute Walk   Phase Initial     Distance 1150 feet     Walk Time 6 minutes     # of Rest Breaks 0     MPH 2.18     METS 3.47     RPE 8     VO2 Peak 12.16     Symptoms No     Resting HR 97 bpm     Resting BP 110/80     Resting Oxygen Saturation  96 %     Exercise Oxygen Saturation  during 6 min walk 96 %     Max Ex. HR 111 bpm     Max Ex. BP 130/78     2 Minute Post BP 120/78        Oxygen Initial Assessment:   Oxygen Re-Evaluation:   Oxygen Discharge (Final Oxygen Re-Evaluation):   Initial Exercise Prescription:  Initial Exercise Prescription - 11/15/23 1100       Date of Initial Exercise RX and Referring Provider   Date 11/15/23    Referring Provider Anner Lenis MD   Alvan Carrier MD sending everything     Treadmill   MPH 1.8    Grade 0.5    Minutes 15    METs 2.5      NuStep   Level 5    SPM 50    Minutes 15    METs 2      Prescription Details   Frequency (times per week) 2    Duration Progress to 30 minutes of continuous aerobic without signs/symptoms of physical distress      Intensity   THRR 40-80% of Max Heartrate 126-156    Ratings of Perceived Exertion 11-13    Perceived Dyspnea 0-4      Resistance Training   Training Prescription Yes    Weight 5    Reps 10-15          Perform Capillary Blood Glucose checks as needed.  Exercise Prescription Changes:   Exercise Prescription Changes     Row Name 11/15/23 1100 11/18/23 1500           Response to Exercise   Blood Pressure (Admit) 110/80 118/88      Blood Pressure (Exercise) 130/78  140/80      Blood Pressure (Exit) 120/78 140/100      Heart Rate (Admit)  97 bpm 65 bpm      Heart Rate (Exercise) 111 bpm 139 bpm      Heart Rate (Exit) 85 bpm 74 bpm      Oxygen Saturation (Admit) 96 % --      Oxygen Saturation (Exercise) 96 % --      Oxygen Saturation (Exit) 96 % --      Rating of Perceived Exertion (Exercise) 8 9      Duration -- Continue with 30 min of aerobic exercise without signs/symptoms of physical distress.      Intensity -- THRR unchanged        Progression   Progression -- Continue to progress workloads to maintain intensity without signs/symptoms of physical distress.        Resistance Training   Training Prescription -- Yes      Weight -- 5      Reps -- 10-15        Treadmill   MPH -- 2.5      Grade -- 1      Minutes -- 15      METs -- 3.26        NuStep   Level -- 8      SPM -- 81      Minutes -- 15      METs -- 4.4         Exercise Comments:   Exercise Comments     Row Name 11/16/23 1452           Exercise Comments First full day of exercise!  Patient was oriented to gym and equipment including functions, settings, policies, and procedures.  Patient's individual exercise prescription and treatment plan were reviewed.  All starting workloads were established based on the results of the 6 minute walk test done at initial orientation visit.  The plan for exercise progression was also introduced and progression will be customized based on patient's performance and goals.          Exercise Goals and Review:   Exercise Goals     Row Name 11/15/23 1151             Exercise Goals   Increase Physical Activity Yes       Intervention Provide advice, education, support and counseling about physical activity/exercise needs.;Develop an individualized exercise prescription for aerobic and resistive training based on initial evaluation findings, risk stratification, comorbidities and participant's personal goals.       Expected Outcomes Short Term: Attend rehab on a regular basis to increase amount of physical  activity.;Long Term: Add in home exercise to make exercise part of routine and to increase amount of physical activity.;Long Term: Exercising regularly at least 3-5 days a week.       Increase Strength and Stamina Yes       Intervention Provide advice, education, support and counseling about physical activity/exercise needs.;Develop an individualized exercise prescription for aerobic and resistive training based on initial evaluation findings, risk stratification, comorbidities and participant's personal goals.       Expected Outcomes Short Term: Increase workloads from initial exercise prescription for resistance, speed, and METs.;Short Term: Perform resistance training exercises routinely during rehab and add in resistance training at home;Long Term: Improve cardiorespiratory fitness, muscular endurance and strength as measured by increased METs and functional capacity ( )       Able to understand and use  rate of perceived exertion (RPE) scale Yes       Intervention Provide education and explanation on how to use RPE scale       Expected Outcomes Short Term: Able to use RPE daily in rehab to express subjective intensity level;Long Term:  Able to use RPE to guide intensity level when exercising independently       Able to understand and use Dyspnea scale Yes       Intervention Provide education and explanation on how to use Dyspnea scale       Expected Outcomes Short Term: Able to use Dyspnea scale daily in rehab to express subjective sense of shortness of breath during exertion;Long Term: Able to use Dyspnea scale to guide intensity level when exercising independently       Knowledge and understanding of Target Heart Rate Range (THRR) Yes       Intervention Provide education and explanation of THRR including how the numbers were predicted and where they are located for reference       Expected Outcomes Long Term: Able to use THRR to govern intensity when exercising independently;Short Term: Able to  state/look up THRR;Short Term: Able to use daily as guideline for intensity in rehab       Able to check pulse independently Yes       Intervention Provide education and demonstration on how to check pulse in carotid and radial arteries.;Review the importance of being able to check your own pulse for safety during independent exercise       Expected Outcomes Short Term: Able to explain why pulse checking is important during independent exercise;Long Term: Able to check pulse independently and accurately       Understanding of Exercise Prescription Yes       Intervention Provide education, explanation, and written materials on patient's individual exercise prescription       Expected Outcomes Short Term: Able to explain program exercise prescription;Long Term: Able to explain home exercise prescription to exercise independently          Exercise Goals Re-Evaluation :  Exercise Goals Re-Evaluation     Row Name 11/16/23 1453             Exercise Goal Re-Evaluation   Exercise Goals Review Able to understand and use rate of perceived exertion (RPE) scale;Knowledge and understanding of Target Heart Rate Range (THRR)       Comments Reviewed RPE and dyspnea scale, THR and program prescription with pt today.  Pt voiced understanding and was given a copy of goals to take home.       Expected Outcomes Short: Use RPE daily to regulate intensity.  Long: Follow program prescription in THR.           Discharge Exercise Prescription (Final Exercise Prescription Changes):  Exercise Prescription Changes - 11/18/23 1500       Response to Exercise   Blood Pressure (Admit) 118/88    Blood Pressure (Exercise) 140/80    Blood Pressure (Exit) 140/100    Heart Rate (Admit) 65 bpm    Heart Rate (Exercise) 139 bpm    Heart Rate (Exit) 74 bpm    Rating of Perceived Exertion (Exercise) 9    Duration Continue with 30 min of aerobic exercise without signs/symptoms of physical distress.    Intensity THRR  unchanged      Progression   Progression Continue to progress workloads to maintain intensity without signs/symptoms of physical distress.      Resistance Training  Training Prescription Yes    Weight 5    Reps 10-15      Treadmill   MPH 2.5    Grade 1    Minutes 15    METs 3.26      NuStep   Level 8    SPM 81    Minutes 15    METs 4.4          Nutrition:  Target Goals: Understanding of nutrition guidelines, daily intake of sodium 1500mg , cholesterol 200mg , calories 30% from fat and 7% or less from saturated fats, daily to have 5 or more servings of fruits and vegetables.  Biometrics:  Pre Biometrics - 11/15/23 1152       Pre Biometrics   Height 5' 11 (1.803 m)    Weight 249 lb 9 oz (113.2 kg)    Waist Circumference 46 inches    Hip Circumference 43 inches    Waist to Hip Ratio 1.07 %    BMI (Calculated) 34.82    Grip Strength 35.6 kg    Single Leg Stand 30 seconds           Nutrition Therapy Plan and Nutrition Goals:   Nutrition Assessments:  MEDIFICTS Score Key: >=70 Need to make dietary changes  40-70 Heart Healthy Diet <= 40 Therapeutic Level Cholesterol Diet  Flowsheet Row CARDIAC REHAB PHASE II ORIENTATION from 11/15/2023 in [facility] CARDIAC REHABILITATION  Picture Your Plate Total Score on Admission 57   Picture Your Plate Scores: <59 Unhealthy dietary pattern with much room for improvement. 41-50 Dietary pattern unlikely to meet recommendations for good health and room for improvement. 51-60 More healthful dietary pattern, with some room for improvement.  >60 Healthy dietary pattern, although there may be some specific behaviors that could be improved.    Nutrition Goals Re-Evaluation:   Nutrition Goals Discharge (Final Nutrition Goals Re-Evaluation):   Psychosocial: Target Goals: Acknowledge presence or absence of significant depression and/or stress, maximize coping skills, provide positive support system. Participant is able  to verbalize types and ability to use techniques and skills needed for reducing stress and depression.  Initial Review & Psychosocial Screening:  Initial Psych Review & Screening - 11/12/23 1417       Initial Review   Current issues with Current Anxiety/Panic;History of Depression;Current Depression      Family Dynamics   Good Support System? No    Concerns No support system    Comments When ask who supports him, he states no one.      Barriers   Psychosocial barriers to participate in program The patient should benefit from training in stress management and relaxation.;There are no identifiable barriers or psychosocial needs.      Screening Interventions   Interventions To provide support and resources with identified psychosocial needs;Provide feedback about the scores to participant;Encouraged to exercise    Expected Outcomes Short Term goal: Utilizing psychosocial counselor, staff and physician to assist with identification of specific Stressors or current issues interfering with healing process. Setting desired goal for each stressor or current issue identified.;Long Term Goal: Stressors or current issues are controlled or eliminated.;Short Term goal: Identification and review with participant of any Quality of Life or Depression concerns found by scoring the questionnaire.;Long Term goal: The participant improves quality of Life and PHQ9 Scores as seen by post scores and/or verbalization of changes          Quality of Life Scores:  Quality of Life - 11/15/23 1158  Quality of Life   Select Quality of Life      Quality of Life Scores   Health/Function Pre 15.27 %    Socioeconomic Pre 16.13 %    Psych/Spiritual Pre 12.21 %    Family Pre 15.5 %    GLOBAL Pre 14.89 %         Scores of 19 and below usually indicate a poorer quality of life in these areas.  A difference of  2-3 points is a clinically meaningful difference.  A difference of 2-3 points in the total score  of the Quality of Life Index has been associated with significant improvement in overall quality of life, self-image, physical symptoms, and general health in studies assessing change in quality of life.  PHQ-9: Review Flowsheet  More data exists      11/15/2023 10/02/2021 06/26/2021 04/03/2021 01/02/2021  Depression screen PHQ 2/9  Decreased Interest 3 3 3 3 3   Down, Depressed, Hopeless 2 3 3 3 3   PHQ - 2 Score 5 6 6 6 6   Altered sleeping 0 3 3 3 3   Tired, decreased energy 0 3 3 3 3   Change in appetite 0 3 3 3 3   Feeling bad or failure about yourself  3 3 3 3 3   Trouble concentrating 0 3 3 3 3   Moving slowly or fidgety/restless 0 3 3 3 3   Suicidal thoughts 0 3 3 3 3   PHQ-9 Score 8 27 27 27 27   Difficult doing work/chores Not difficult at all - Extremely dIfficult - -   Interpretation of Total Score  Total Score Depression Severity:  1-4 = Minimal depression, 5-9 = Mild depression, 10-14 = Moderate depression, 15-19 = Moderately severe depression, 20-27 = Severe depression   Psychosocial Evaluation and Intervention:  Psychosocial Evaluation - 11/12/23 1418       Psychosocial Evaluation & Interventions   Interventions Stress management education;Relaxation education;Encouraged to exercise with the program and follow exercise prescription    Comments Patient referred to cardiac rehab with NSTEMI/Stent placement. He has depression and anxiety listed in his medical history but says he has no more than his usual. He is currently not being treated. He lives alone and says he has no support person. 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 Olivia Pavy, PA put lifting restrictions of no  more than 10 lbs at his visit and he said he could not work with these restrictions. He is a current smoker 0.5 ppd and vapes also and is not ready to quit. His goals for the program are to be able to return to work. He says he will still be able to come to sessions after he returns to work.  He has no barriers identified to complete the program.    Expected Outcomes Short Term: Patient will start the program and attend consistently. Long Term: Patient will complete the program meeting personal goals.    Continue Psychosocial Services  Follow up required by staff          Psychosocial Re-Evaluation:   Psychosocial Discharge (Final Psychosocial Re-Evaluation):   Vocational Rehabilitation: Provide vocational rehab assistance to qualifying candidates.   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  Education: Education Goals: Education classes will be provided on a weekly basis, covering required topics. Participant will state understanding/return demonstration of topics presented.  Learning Barriers/Preferences:  Learning Barriers/Preferences - 11/12/23 1416       Learning Barriers/Preferences   Learning Barriers None    Learning Preferences Written Material;Audio;Skilled Demonstration          Education Topics: Hypertension, Hypertension Reduction -Define heart disease and high blood pressure. Discus how high blood pressure affects the body and ways to reduce high blood pressure.   Exercise and Your Heart -Discuss why it is important to exercise, the FITT principles of exercise, normal and abnormal responses to exercise, and how to exercise safely.   Angina -Discuss definition of angina, causes of angina, treatment of angina, and how to decrease risk of having angina.   Cardiac Medications -Review what the following cardiac medications are used for, how they affect the body, and side effects that may occur when taking the medications.  Medications include Aspirin , Beta blockers, calcium  channel  blockers, ACE Inhibitors, angiotensin receptor blockers, diuretics, digoxin, and antihyperlipidemics.   Congestive Heart Failure -Discuss the definition of CHF, how to live with CHF, the signs and symptoms of CHF, and how keep track of weight and sodium intake.   Heart Disease and Intimacy -Discus the effect sexual activity has on the heart, how changes occur during intimacy as we age, and safety during sexual activity.   Smoking Cessation / COPD -Discuss different methods to quit smoking, the health benefits of quitting smoking, and the definition of COPD.   Nutrition I: Fats -Discuss the types of cholesterol, what cholesterol does to the heart, and how cholesterol levels can be controlled.   Nutrition II: Labels -Discuss the different components of food labels and how to read food label   Heart Parts/Heart Disease and PAD -Discuss the anatomy of the heart, the pathway of blood circulation through the heart, and these are affected by heart disease.   Stress I: Signs and Symptoms -Discuss the causes of stress, how stress may lead to anxiety and depression, and ways to limit stress.   Stress II: Relaxation -Discuss different types of relaxation techniques to limit stress.   Warning Signs of Stroke / TIA -Discuss definition of a stroke, what the signs and symptoms are of a stroke, and how to identify when someone is having stroke.   Knowledge Questionnaire Score:   Core Components/Risk Factors/Patient Goals at Admission:  Personal Goals and Risk Factors at Admission - 11/12/23 1416       Core Components/Risk Factors/Patient Goals on Admission    Weight Management Obesity    Diabetes Yes    Intervention Provide education about signs/symptoms and action to take for hypo/hyperglycemia.;Provide education about proper nutrition, including hydration, and aerobic/resistive exercise prescription along with prescribed medications to achieve blood glucose in normal ranges:  Fasting glucose 65-99 mg/dL    Expected Outcomes Short Term: Participant verbalizes understanding of the signs/symptoms and immediate care of hyper/hypoglycemia, proper foot care and importance of medication, aerobic/resistive exercise and nutrition plan for blood glucose control.;Long Term: Attainment of HbA1C < 7%.    Hypertension Yes    Intervention Provide education on lifestyle modifcations including regular physical activity/exercise, weight management, moderate sodium restriction and increased consumption of fresh fruit, vegetables, and low fat dairy, alcohol moderation, and smoking cessation.;Monitor prescription use compliance.    Expected Outcomes Short Term: Continued assessment and intervention until BP is < 140/53mm HG in hypertensive participants. < 130/57mm HG in hypertensive  participants with diabetes, heart failure or chronic kidney disease.;Long Term: Maintenance of blood pressure at goal levels.    Lipids Yes    Intervention Provide education and support for participant on nutrition & aerobic/resistive exercise along with prescribed medications to achieve LDL 70mg , HDL >40mg .    Expected Outcomes Short Term: Participant states understanding of desired cholesterol values and is compliant with medications prescribed. Participant is following exercise prescription and nutrition guidelines.;Long Term: Cholesterol controlled with medications as prescribed, with individualized exercise RX and with personalized nutrition plan. Value goals: LDL < 70mg , HDL > 40 mg.          Core Components/Risk Factors/Patient Goals Review:    Core Components/Risk Factors/Patient Goals at Discharge (Final Review):    ITP Comments:  ITP Comments     Row Name 11/12/23 1429 11/15/23 1157 11/16/23 1452 12/01/23 1816     ITP Comments Virtual orientation visit completed for cardiac rehab with NSTEMI/Stent placement. On-site orientation visit scheduled for 11/15/23 at 10:30. Patient arrived for 1st  visit/orientation/education at 1030. Patient was referred to CR by Alm Harding/Dr. Branch attending due to NSTEMI/Stent placement. During orientation advised patient on arrival and appointment times what to wear, what to do before, during and after exercise. Reviewed attendance and class policy.  Pt is scheduled to return Cardiac Rehab on 11/16/23 at 1500. Pt was advised to come to class 15 minutes before class starts.  Discussed RPE/Dpysnea scales. Patient participated in warm up stretches. Patient was able to complete 6 minute walk test.  Telemetry:NSR. Patient was measured for the equipment. Discussed equipment safety with patient. Took patient pre-anthropometric measurements. Patient finished visit at 1145. First full day of exercise!  Patient was oriented to gym and equipment including functions, settings, policies, and procedures.  Patient's individual exercise prescription and treatment plan were reviewed.  All starting workloads were established based on the results of the 6 minute walk test done at initial orientation visit.  The plan for exercise progression was also introduced and progression will be customized based on patient's performance and goals. 30 day review completed. ITP sent to Dr. Dorn Ross, Medical Director of Cardiac Rehab. Continue with ITP unless changes are made by physician.  Newer to program       Comments: 30 day review

## 2023-12-02 ENCOUNTER — Other Ambulatory Visit: Payer: Self-pay | Admitting: *Deleted

## 2023-12-02 ENCOUNTER — Encounter (HOSPITAL_COMMUNITY)
Admission: RE | Admit: 2023-12-02 | Discharge: 2023-12-02 | Disposition: A | Source: Ambulatory Visit | Attending: Cardiology

## 2023-12-02 DIAGNOSIS — I214 Non-ST elevation (NSTEMI) myocardial infarction: Secondary | ICD-10-CM

## 2023-12-02 DIAGNOSIS — E1169 Type 2 diabetes mellitus with other specified complication: Secondary | ICD-10-CM

## 2023-12-02 DIAGNOSIS — E1159 Type 2 diabetes mellitus with other circulatory complications: Secondary | ICD-10-CM

## 2023-12-02 DIAGNOSIS — Z955 Presence of coronary angioplasty implant and graft: Secondary | ICD-10-CM

## 2023-12-02 MED ORDER — LISINOPRIL 5 MG PO TABS: 5.0000 mg | ORAL_TABLET | Freq: Every day | ORAL | 0 refills | Status: DC

## 2023-12-02 NOTE — Progress Notes (Signed)
 Daily Session Note  Patient Details  Name: Carl [name] MRN: [id number] Date of Birth: 1975/09/17 Referring Provider:   Flowsheet Row CARDIAC REHAB PHASE II ORIENTATION from 11/15/2023 in [facility] CARDIAC REHABILITATION  Referring Provider Anner Lenis MD  Suzzette Carrier MD sending everything]    Encounter Date: 12/02/2023  Check In:  Session Check In - 12/02/23 1446       Check-In   Supervising physician immediately available to respond to emergencies See telemetry face sheet for immediately available MD    Location [facility]    Staff Present Powell Benders, BS, Exercise Physiologist;Hillary Calvert BSN, RN;Debra Vicci, RN, BSN    Virtual Visit No    Medication changes reported     No    Fall or balance concerns reported    No    Tobacco Cessation No Change    Warm-up and Cool-down Performed on first and last piece of equipment    Resistance Training Performed Yes    VAD Patient? No    PAD/SET Patient? No      Pain Assessment   Currently in Pain? No/denies    Pain Score 0-No pain    Multiple Pain Sites No          Capillary Blood Glucose: No results found for this or any previous visit (from the past 24 hours).    Social History   Tobacco Use  Smoking Status Every Day   Current packs/day: 1.00   Average packs/day: 1 pack/day for 30.9 years (30.9 ttl pk-yrs)   Types: Cigarettes   Start date: 01/03/1993  Smokeless Tobacco Never    Goals Met:  Independence with exercise equipment Exercise tolerated well No report of concerns or symptoms today Strength training completed today  Goals Unmet:  Not Applicable  Comments: Pt able to follow exercise prescription today without complaint.  Will continue to monitor for progression.

## 2023-12-03 ENCOUNTER — Encounter (HOSPITAL_COMMUNITY)
Admission: RE | Admit: 2023-12-03 | Discharge: 2023-12-03 | Disposition: A | Discharge status: Home / Self Care | Source: Ambulatory Visit | Attending: Cardiology | Admitting: Cardiology

## 2023-12-03 DIAGNOSIS — I214 Non-ST elevation (NSTEMI) myocardial infarction: Secondary | ICD-10-CM | POA: Diagnosis not present

## 2023-12-03 DIAGNOSIS — Z955 Presence of coronary angioplasty implant and graft: Secondary | ICD-10-CM

## 2023-12-03 NOTE — Progress Notes (Signed)
 Daily Session Note  Patient Details  Name: Carl [name] MRN: [id number] Date of Birth: May 03, 1975 Referring Provider:   Flowsheet Row CARDIAC REHAB PHASE II ORIENTATION from 11/15/2023 in [facility] CARDIAC REHABILITATION  Referring Provider Anner Lenis MD  Suzzette Carrier MD sending everything]    Encounter Date: 12/03/2023  Check In:  Session Check In - 12/03/23 1105       Check-In   Supervising physician immediately available to respond to emergencies See telemetry face sheet for immediately available MD    Location [facility]    Staff Present Adrien Louder, RN, BSN;Heather Con, BS, Exercise Physiologist;Kahley Leib Zina, RN    Virtual Visit No    Medication changes reported     No    Fall or balance concerns reported    No    Warm-up and Cool-down Performed on first and last piece of equipment    Resistance Training Performed Yes    VAD Patient? No    PAD/SET Patient? No      Pain Assessment   Currently in Pain? No/denies          Capillary Blood Glucose: No results found for this or any previous visit (from the past 24 hours).    Social History   Tobacco Use  Smoking Status Every Day   Current packs/day: 1.00   Average packs/day: 1 pack/day for 30.9 years (30.9 ttl pk-yrs)   Types: Cigarettes   Start date: 01/03/1993  Smokeless Tobacco Never    Goals Met:  Independence with exercise equipment Exercise tolerated well No report of concerns or symptoms today Strength training completed today  Goals Unmet:  Not Applicable  Comments: Pt able to follow exercise prescription today without complaint.  Will continue to monitor for progression.

## 2023-12-07 ENCOUNTER — Encounter (HOSPITAL_COMMUNITY)
Admission: RE | Admit: 2023-12-07 | Discharge: 2023-12-07 | Disposition: A | Discharge status: Home / Self Care | Source: Ambulatory Visit | Attending: Cardiology | Admitting: Cardiology

## 2023-12-07 DIAGNOSIS — I214 Non-ST elevation (NSTEMI) myocardial infarction: Secondary | ICD-10-CM

## 2023-12-07 DIAGNOSIS — Z955 Presence of coronary angioplasty implant and graft: Secondary | ICD-10-CM

## 2023-12-07 NOTE — Progress Notes (Signed)
 Daily Session Note  Patient Details  Name: Carl [name] MRN: [id number] Date of Birth: 1975-11-28 Referring Provider:   Flowsheet Row CARDIAC REHAB PHASE II ORIENTATION from 11/15/2023 in [facility] CARDIAC REHABILITATION  Referring Provider Anner Lenis MD  Suzzette Carrier MD sending everything]    Encounter Date: 12/07/2023  Check In:  Session Check In - 12/07/23 1501       Check-In   Supervising physician immediately available to respond to emergencies See telemetry face sheet for immediately available MD    Location [facility]    Staff Present Laymon Rattler, BSN, RN, WTA-C;Heather Con, BS, Exercise Physiologist    Virtual Visit No    Medication changes reported     No    Fall or balance concerns reported    No    Tobacco Cessation No Change    Warm-up and Cool-down Performed on first and last piece of equipment    Resistance Training Performed Yes    VAD Patient? No    PAD/SET Patient? No      Pain Assessment   Currently in Pain? No/denies          Capillary Blood Glucose: No results found for this or any previous visit (from the past 24 hours).    Social History   Tobacco Use  Smoking Status Every Day   Current packs/day: 1.00   Average packs/day: 1 pack/day for 30.9 years (30.9 ttl pk-yrs)   Types: Cigarettes   Start date: 01/03/1993  Smokeless Tobacco Never    Goals Met:  Independence with exercise equipment Exercise tolerated well No report of concerns or symptoms today Strength training completed today  Goals Unmet:  Not Applicable  Comments: Pt able to follow exercise prescription today without complaint.  Will continue to monitor for progression.

## 2023-12-09 ENCOUNTER — Encounter (HOSPITAL_COMMUNITY)
Admission: RE | Admit: 2023-12-09 | Discharge: 2023-12-09 | Disposition: A | Discharge status: Home / Self Care | Source: Ambulatory Visit | Attending: Cardiology | Admitting: Cardiology

## 2023-12-09 DIAGNOSIS — I214 Non-ST elevation (NSTEMI) myocardial infarction: Secondary | ICD-10-CM

## 2023-12-09 DIAGNOSIS — Z955 Presence of coronary angioplasty implant and graft: Secondary | ICD-10-CM

## 2023-12-09 NOTE — Progress Notes (Signed)
 Daily Session Note  Patient Details  Name: Carl [name] MRN: [id number] Date of Birth: [birth year] Referring Provider:   Flowsheet Row CARDIAC REHAB PHASE II ORIENTATION from 11/15/2023 in [facility] CARDIAC REHABILITATION  Referring Provider Anner Lenis MD  Suzzette Carrier MD sending everything]    Encounter Date: 12/09/2023  Check In:  Session Check In - 12/09/23 1446       Check-In   Supervising physician immediately available to respond to emergencies See telemetry face sheet for immediately available MD    Location [facility]    Staff Present Powell Benders, BS, Exercise Physiologist;Hillary Lexington BSN, RN;Jessica [place]hoe, MA, RCEP, CCRP, CCET    Virtual Visit No    Medication changes reported     No    Fall or balance concerns reported    No    Tobacco Cessation No Change    Warm-up and Cool-down Performed on first and last piece of equipment    Resistance Training Performed Yes    VAD Patient? No    PAD/SET Patient? No      Pain Assessment   Currently in Pain? No/denies    Pain Score 0-No pain    Multiple Pain Sites No          Capillary Blood Glucose: No results found for this or any previous visit (from the past 24 hours).    Social History   Tobacco Use  Smoking Status Every Day   Current packs/day: 1.00   Average packs/day: 1 pack/day for 30.9 years (30.9 ttl pk-yrs)   Types: Cigarettes   Start date: 01/03/1993  Smokeless Tobacco Never    Goals Met:  Independence with exercise equipment Exercise tolerated well No report of concerns or symptoms today Strength training completed today  Goals Unmet:  Not Applicable  Comments: Pt able to follow exercise prescription today without complaint.  Will continue to monitor for progression.

## 2023-12-14 ENCOUNTER — Encounter (HOSPITAL_COMMUNITY)
Admission: RE | Admit: 2023-12-14 | Discharge: 2023-12-14 | Disposition: A | Discharge status: Home / Self Care | Source: Ambulatory Visit | Attending: Cardiology | Admitting: Cardiology

## 2023-12-14 DIAGNOSIS — I214 Non-ST elevation (NSTEMI) myocardial infarction: Secondary | ICD-10-CM

## 2023-12-14 DIAGNOSIS — Z955 Presence of coronary angioplasty implant and graft: Secondary | ICD-10-CM

## 2023-12-14 NOTE — Progress Notes (Signed)
 Daily Session Note  Patient Details  Name: Carl [name] MRN: [id number] Date of Birth: 05/25/1975 Referring Provider:   Flowsheet Row CARDIAC REHAB PHASE II ORIENTATION from 11/15/2023 in [facility] CARDIAC REHABILITATION  Referring Provider Anner Lenis MD  Suzzette Carrier MD sending everything]    Encounter Date: 12/14/2023  Check In:  Session Check In - 12/14/23 1458       Check-In   Supervising physician immediately available to respond to emergencies See telemetry face sheet for immediately available MD    Location [facility]    Staff Present Richerd Buddle, RN;Heather Con, BS, Exercise Physiologist;Anderson [place]brooks Jackquline, BSN, RN, WTA-C    Virtual Visit No    Medication changes reported     No    Fall or balance concerns reported    No    Tobacco Cessation No Change    Warm-up and Cool-down Performed on first and last piece of equipment    Resistance Training Performed Yes    VAD Patient? No    PAD/SET Patient? No      Pain Assessment   Currently in Pain? No/denies          Capillary Blood Glucose: No results found for this or any previous visit (from the past 24 hours).    Social History   Tobacco Use  Smoking Status Every Day   Current packs/day: 1.00   Average packs/day: 1 pack/day for 30.9 years (30.9 ttl pk-yrs)   Types: Cigarettes   Start date: 01/03/1993  Smokeless Tobacco Never    Goals Met:  Independence with exercise equipment Exercise tolerated well No report of concerns or symptoms today Strength training completed today  Goals Unmet:  Not Applicable  Comments: Pt able to follow exercise prescription today without complaint.  Will continue to monitor for progression.

## 2023-12-16 ENCOUNTER — Encounter (HOSPITAL_COMMUNITY)
Admission: RE | Admit: 2023-12-16 | Discharge: 2023-12-16 | Disposition: A | Discharge status: Home / Self Care | Source: Ambulatory Visit | Attending: Cardiology | Admitting: Cardiology

## 2023-12-16 DIAGNOSIS — I214 Non-ST elevation (NSTEMI) myocardial infarction: Secondary | ICD-10-CM

## 2023-12-16 DIAGNOSIS — Z955 Presence of coronary angioplasty implant and graft: Secondary | ICD-10-CM

## 2023-12-16 NOTE — Progress Notes (Signed)
 Daily Session Note  Patient Details  Name: Carl [name] MRN: [id number] Date of Birth: [birth year] Referring Provider:   Flowsheet Row CARDIAC REHAB PHASE II ORIENTATION from 11/15/2023 in [facility] CARDIAC REHABILITATION  Referring Provider Anner Lenis MD  Suzzette Carrier MD sending everything]    Encounter Date: 12/16/2023  Check In:  Session Check In - 12/16/23 1538       Check-In   Supervising physician immediately available to respond to emergencies See telemetry face sheet for immediately available MD    Location [facility]    Staff Present Powell Benders, BS, Exercise Physiologist;Shalandra Leu Vonzell, MA, RCEP, CCRP, Sueellen Louder, RN, BSN    Virtual Visit No    Medication changes reported     No    Fall or balance concerns reported    No    Warm-up and Cool-down Performed on first and last piece of equipment    Resistance Training Performed Yes    VAD Patient? No    PAD/SET Patient? No      Pain Assessment   Currently in Pain? No/denies          Capillary Blood Glucose: No results found for this or any previous visit (from the past 24 hours).    Social History   Tobacco Use  Smoking Status Every Day   Current packs/day: 1.00   Average packs/day: 1 pack/day for 30.9 years (30.9 ttl pk-yrs)   Types: Cigarettes   Start date: 01/03/1993  Smokeless Tobacco Never    Goals Met:  Independence with exercise equipment Exercise tolerated well No report of concerns or symptoms today Strength training completed today  Goals Unmet:  Not Applicable  Comments: Pt able to follow exercise prescription today without complaint.  Will continue to monitor for progression.

## 2023-12-21 ENCOUNTER — Encounter (HOSPITAL_COMMUNITY)
Admission: RE | Admit: 2023-12-21 | Discharge: 2023-12-21 | Disposition: A | Discharge status: Home / Self Care | Source: Ambulatory Visit | Attending: Cardiology | Admitting: Cardiology

## 2023-12-21 DIAGNOSIS — Z955 Presence of coronary angioplasty implant and graft: Secondary | ICD-10-CM

## 2023-12-21 DIAGNOSIS — I214 Non-ST elevation (NSTEMI) myocardial infarction: Secondary | ICD-10-CM

## 2023-12-21 NOTE — Progress Notes (Signed)
 Daily Session Note  Patient Details  Name: Carl [name] MRN: [id number] Date of Birth: 1975/03/10 Referring Provider:   Flowsheet Row CARDIAC REHAB PHASE II ORIENTATION from 11/15/2023 in [facility] CARDIAC REHABILITATION  Referring Provider Anner Lenis MD  Suzzette Carrier MD sending everything]    Encounter Date: 12/21/2023  Check In:  Session Check In - 12/21/23 1457       Check-In   Supervising physician immediately available to respond to emergencies See telemetry face sheet for immediately available MD    Location [facility]    Staff Present Powell Benders, BS, Exercise Physiologist;Tieara Flitton Zina, RN;Brittany Jackquline, BSN, RN, WTA-C    Virtual Visit No    Medication changes reported     No    Fall or balance concerns reported    No    Warm-up and Cool-down Performed on first and last piece of equipment    Resistance Training Performed Yes    VAD Patient? No    PAD/SET Patient? No      Pain Assessment   Currently in Pain? No/denies          Capillary Blood Glucose: No results found for this or any previous visit (from the past 24 hours).    Social History   Tobacco Use  Smoking Status Every Day   Current packs/day: 1.00   Average packs/day: 1 pack/day for 31.0 years (31.0 ttl pk-yrs)   Types: Cigarettes   Start date: 01/03/1993  Smokeless Tobacco Never    Goals Met:  Independence with exercise equipment Exercise tolerated well No report of concerns or symptoms today Strength training completed today  Goals Unmet:  Not Applicable  Comments: Pt able to follow exercise prescription today without complaint.  Will continue to monitor for progression.

## 2023-12-22 ENCOUNTER — Other Ambulatory Visit: Payer: Self-pay | Admitting: Family Medicine

## 2023-12-23 ENCOUNTER — Encounter (HOSPITAL_COMMUNITY)
Admission: RE | Admit: 2023-12-23 | Discharge: 2023-12-23 | Disposition: A | Discharge status: Home / Self Care | Source: Ambulatory Visit | Attending: Cardiology | Admitting: Cardiology

## 2023-12-23 DIAGNOSIS — Z955 Presence of coronary angioplasty implant and graft: Secondary | ICD-10-CM

## 2023-12-23 DIAGNOSIS — I214 Non-ST elevation (NSTEMI) myocardial infarction: Secondary | ICD-10-CM | POA: Diagnosis not present

## 2023-12-23 NOTE — Progress Notes (Signed)
 Daily Session Note  Patient Details  Name: Carl [name] MRN: [id number] Date of Birth: 1975/12/27 Referring Provider:   Flowsheet Row CARDIAC REHAB PHASE II ORIENTATION from 11/15/2023 in [facility] CARDIAC REHABILITATION  Referring Provider Anner Lenis MD  Suzzette Carrier MD sending everything]    Encounter Date: 12/23/2023  Check In:  Session Check In - 12/23/23 1440       Check-In   Supervising physician immediately available to respond to emergencies See telemetry face sheet for immediately available MD    Staff Present Powell Benders, BS, Exercise Physiologist;Hillary Roper BSN, RN;Jessica [place], MA, RCEP, CCRP, CCET    Virtual Visit No    Medication changes reported     No    Fall or balance concerns reported    No    Tobacco Cessation No Change    Warm-up and Cool-down Performed on first and last piece of equipment    Resistance Training Performed Yes    VAD Patient? No    PAD/SET Patient? No      Pain Assessment   Currently in Pain? No/denies    Pain Score 0-No pain    Multiple Pain Sites No          Capillary Blood Glucose: No results found for this or any previous visit (from the past 24 hours).    Social History   Tobacco Use  Smoking Status Every Day   Current packs/day: 1.00   Average packs/day: 1 pack/day for 31.0 years (31.0 ttl pk-yrs)   Types: Cigarettes   Start date: 01/03/1993  Smokeless Tobacco Never    Goals Met:  Independence with exercise equipment Exercise tolerated well No report of concerns or symptoms today Strength training completed today  Goals Unmet:  Not Applicable  Comments: Pt able to follow exercise prescription today without complaint.  Will continue to monitor for progression.

## 2023-12-26 NOTE — Progress Notes (Unsigned)
 Cardiology Office Note    Date:  12/29/2023  ID:  Carl [name], DOB 1976-01-18, MRN [id number] Cardiologist: Alvan Carrier, MD { :  History of Present Illness:    Carl [name] is a 48 y.o. male with past medical history of CAD (s/p cath in 2015 showing occluded proximal LAD, repeat cath in 06/2017 showing 100% Prox LAD stenosis, 50% RCA, and 40% RPDA and underwent CTO PCI of the LAD with DESx2  in 08/2017, catheterization in 05/2023 showing chronically occluded large D2 with medical management recommended, NSTEMI in 10/2023 with occlusion of old LAD stent and s/p DES x 1 to the LAD overlapping his old stent), HTN, HLD, Type 2 DM, OSA and tobacco use who presents to the office today for 52-month follow-up.  He was last examined by Rosaline Pavy, PA in 10/2023 following his recent NSTEMI during which he underwent DES placement to the LAD. Was recommended to continue lifelong DAPT with ASA and Plavix . At the time of follow-up, he denied any recurrent anginal symptoms. Was still smoking approximately half pack per day along with vaping. Was cleared to return to work and no changes were made to his medications at that time. He was continued on Amlodipine  2.5 mg daily, ASA 81 mg daily, Atorvastatin  80 mg daily, Plavix  75 mg daily, Jardiance  10 mg daily, Lisinopril  5 mg daily and Toprol -XL 50 mg daily.  In talking with the patient today, he denies any recent exertional chest pain or progressive dyspnea on exertion resembling his prior angina. Reports shortness of breath sometimes at cardiac rehab if he overdoes it. No recent palpitations, orthopnea, PND or pitting edema. He has been consuming hot dogs, hamburgers and spaghetti regularly as he has been trying to follow a strict budget as he was not cleared to return to work for the [employer] until 2 months out from his NSTEMI.  Studies Reviewed:   EKG: EKG is not ordered today.  Cardiac Catheterization: 10/2023   Ost LAD lesion is 40% stenosed.    Dist RCA lesion is 40% stenosed.   Ost 1st Diag lesion is 100% stenosed.   Ost RPDA lesion is 20% stenosed.   Prox Cx to Mid Cx lesion is 40% stenosed.   Prox RCA lesion is 40% stenosed.   Prox LAD lesion is 100% stenosed.   A drug-eluting stent was successfully placed using a STENT SYNERGY XD 3.0X20.   Post intervention, there is a 0% residual stenosis.   The left ventricular systolic function is normal.   LV end diastolic pressure is mildly elevated.   The left ventricular ejection fraction is 55-65% by visual estimate.   Recommend dual antiplatelet therapy with Aspirin  81mg  daily and Clopidogrel  75mg  daily long-term (beyond 12 months) because of multiple stents.   Single vessel occlusive CAD. Reocclusion of mid LAD at proximal portion of old stent Good LV function Mildly elevated LVEDP 22 mm Hg Successful PCI of the LAD with DES x 1 overlapping with old stent   Plan: DAPT indefinitely given multiple LAD stents. Anticipate DC in am.  Echocardiogram: 10/2023 IMPRESSIONS     1. Left ventricular ejection fraction, by estimation, is 55 to 60%. The  left ventricle has normal function. The left ventricle has no regional  wall motion abnormalities. Left ventricular diastolic parameters were  normal.   2. Right ventricular systolic function is normal. The right ventricular  size is normal.   3. The mitral valve is normal in structure. No evidence of mitral valve  regurgitation.  No evidence of mitral stenosis.   4. The aortic valve is tricuspid. Aortic valve regurgitation is not  visualized. No aortic stenosis is present.   5. Aortic dilatation noted. There is mild dilatation of the ascending  aorta, measuring 37 mm.   6. The inferior vena cava is normal in size with greater than 50%  respiratory variability, suggesting right atrial pressure of 3 mmHg.   Comparison(s): EF 55-60%.   Physical Exam:   VS:  BP 118/70 (BP Location: Right Arm, Cuff Size: Large)   Pulse 75   Ht 5' 10  (1.778 m)   Wt 251 lb (113.9 kg)   SpO2 97%   BMI 36.01 kg/m    Wt Readings from Last 3 Encounters:  12/29/23 251 lb (113.9 kg)  11/15/23 249 lb 9 oz (113.2 kg)  11/09/23 252 lb 12.8 oz (114.7 kg)     GEN: Well nourished, well developed male appearing in no acute distress NECK: No JVD; No carotid bruits CARDIAC: RRR, no murmurs, rubs, gallops RESPIRATORY:  Clear to auscultation without rales, wheezing or rhonchi  ABDOMEN: Appears non-distended. No obvious abdominal masses. EXTREMITIES: No clubbing or cyanosis. No pitting edema.  Distal pedal pulses are 2+ bilaterally.   Assessment and Plan:   1. Coronary artery disease involving native coronary artery of native heart with other form of angina pectoris - He previously underwent CTO PCI of the LAD with DES x 2 in 2019 and did have an NSTEMI in 10/2023 with occlusion of his prior LAD stent and underwent DES placement x 1 to the LAD which overlapped his prior stent.  - He denies any specific anginal symptoms. Previously cleared to return to work but was out per the [employer] guidelines. Will provide another letter today stating that he is cleared to return to work from our perspective. Reviewed the importance of trying to follow a heart healthy diet. - Continue ASA 81 mg daily, Plavix  75 mg daily (previously recommended to continue DAPT indefinitely), Atorvastatin  80 mg daily, Jardiance  10 mg daily and Toprol -XL 50 mg daily.  2. HTN - BP is at well-controlled at 118/70 during today's visit. Continue current medical therapy with Amlodipine  2.5 mg daily, Lisinopril  5 mg daily and Toprol -XL 50 mg daily.  3. Hyperlipidemia LDL goal <55 - LDL was at 51 when checked in 09/2023. Continue current medical therapy with Atorvastatin  80 mg daily.  Signed, Laymon CHRISTELLA Qua, PA-C

## 2023-12-28 ENCOUNTER — Encounter (HOSPITAL_COMMUNITY)
Admission: RE | Admit: 2023-12-28 | Discharge: 2023-12-28 | Disposition: A | Discharge status: Home / Self Care | Source: Ambulatory Visit | Attending: Cardiology | Admitting: Cardiology

## 2023-12-28 DIAGNOSIS — Z955 Presence of coronary angioplasty implant and graft: Secondary | ICD-10-CM | POA: Diagnosis present

## 2023-12-28 DIAGNOSIS — I214 Non-ST elevation (NSTEMI) myocardial infarction: Secondary | ICD-10-CM | POA: Insufficient documentation

## 2023-12-28 NOTE — Progress Notes (Signed)
 Daily Session Note  Patient Details  Name: Carl [name] MRN: [id number] Date of Birth: January 26, 1976 Referring Provider:   Flowsheet Row CARDIAC REHAB PHASE II ORIENTATION from 11/15/2023 in [facility] CARDIAC REHABILITATION  Referring Provider Anner Lenis MD  Suzzette Carrier MD sending everything]    Encounter Date: 12/28/2023  Check In:  Session Check In - 12/28/23 1432       Check-In   Supervising physician immediately available to respond to emergencies See telemetry face sheet for immediately available MD    Location [facility]    Staff Present Laymon Rattler, BSN, RN, WTA-C;Victoria Zina, RN    Virtual Visit No    Medication changes reported     No    Fall or balance concerns reported    No    Tobacco Cessation No Change    Warm-up and Cool-down Performed on first and last piece of equipment    Resistance Training Performed Yes    VAD Patient? No    PAD/SET Patient? No      Pain Assessment   Currently in Pain? No/denies          Capillary Blood Glucose: No results found for this or any previous visit (from the past 24 hours).    Social History   Tobacco Use  Smoking Status Every Day   Current packs/day: 1.00   Average packs/day: 1 pack/day for 31.0 years (31.0 ttl pk-yrs)   Types: Cigarettes   Start date: 01/03/1993  Smokeless Tobacco Never    Goals Met:  Independence with exercise equipment Exercise tolerated well No report of concerns or symptoms today Strength training completed today  Goals Unmet:  Not Applicable  Comments: Pt able to follow exercise prescription today without complaint.  Will continue to monitor for progression.

## 2023-12-29 ENCOUNTER — Encounter: Payer: Self-pay | Admitting: Student

## 2023-12-29 ENCOUNTER — Ambulatory Visit: Attending: Student | Admitting: Student

## 2023-12-29 ENCOUNTER — Encounter (HOSPITAL_COMMUNITY): Payer: Self-pay

## 2023-12-29 VITALS — BP 118/70 | HR 75 | Ht 70.0 in | Wt 251.0 lb

## 2023-12-29 DIAGNOSIS — E785 Hyperlipidemia, unspecified: Secondary | ICD-10-CM

## 2023-12-29 DIAGNOSIS — I10 Essential (primary) hypertension: Secondary | ICD-10-CM

## 2023-12-29 DIAGNOSIS — I25118 Atherosclerotic heart disease of native coronary artery with other forms of angina pectoris: Secondary | ICD-10-CM

## 2023-12-29 DIAGNOSIS — Z955 Presence of coronary angioplasty implant and graft: Secondary | ICD-10-CM

## 2023-12-29 DIAGNOSIS — I214 Non-ST elevation (NSTEMI) myocardial infarction: Secondary | ICD-10-CM

## 2023-12-29 NOTE — Patient Instructions (Signed)
 Medication Instructions:  Your physician recommends that you continue on your current medications as directed. Please refer to the Current Medication list given to you today.  *If you need a refill on your cardiac medications before your next appointment, please call your pharmacy*  Lab Work: NONE   If you have labs (blood work) drawn today and your tests are completely normal, you will receive your results only by: MyChart Message (if you have MyChart) OR A paper copy in the mail If you have any lab test that is abnormal or we need to change your treatment, we will call you to review the results.  Testing/Procedures: NONE   Follow-Up: At [facility], you and your health needs are our priority.  As part of our continuing mission to provide you with exceptional heart care, our providers are all part of one team.  This team includes your primary Cardiologist (physician) and Advanced Practice Providers or APPs (Physician Assistants and Nurse Practitioners) who all work together to provide you with the care you need, when you need it.  Your next appointment:   5 -6 month(s)  Provider:   Dorn Ross, MD    We recommend signing up for the patient portal called MyChart.  Sign up information is provided on this After Visit Summary.  MyChart is used to connect with patients for Virtual Visits (Telemedicine).  Patients are able to view lab/test results, encounter notes, upcoming appointments, etc.  Non-urgent messages can be sent to your provider as well.   To learn more about what you can do with MyChart, go to [email or web address].   Other Instructions Thank you for choosing [facility]!

## 2023-12-29 NOTE — Progress Notes (Signed)
 Cardiac Individual Treatment Plan  Patient Details  Name: Carl [name] MRN: [id number] Date of Birth: 1975/12/21 Referring Provider:   Flowsheet Row CARDIAC REHAB PHASE II ORIENTATION from 11/15/2023 in [facility] CARDIAC REHABILITATION  Referring Provider Anner Lenis MD  Suzzette Carrier MD sending everything]    Initial Encounter Date:  Flowsheet Row CARDIAC REHAB PHASE II ORIENTATION from 11/15/2023 in [place] [facility]  Date 11/15/23    Visit Diagnosis: NSTEMI (non-ST elevated myocardial infarction) [facility])  Status post coronary artery stent placement  Patient's Home Medications on Admission:  Current Outpatient Medications:    amLODipine  (NORVASC ) 2.5 MG tablet, TAKE 1 TABLET BY MOUTH DAILY, Disp: 90 tablet, Rfl: 3   Ascorbic Acid (VITAMIN C) 1000 MG tablet, Take 2,000 mg by mouth daily., Disp: , Rfl:    aspirin  EC 81 MG tablet, Take 81 mg by mouth daily. , Disp: , Rfl:    atorvastatin  (LIPITOR ) 80 MG tablet, TAKE 1 TABLET BY MOUTH DAILY, Disp: 90 tablet, Rfl: 3   b complex vitamins capsule, Take 1 capsule by mouth daily. (Patient not taking: Reported on 12/29/2023), Disp: , Rfl:    clopidogrel  (PLAVIX ) 75 MG tablet, Take 1 tablet (75 mg total) by mouth daily with breakfast., Disp: 90 tablet, Rfl: 3   empagliflozin  (JARDIANCE ) 10 MG TABS tablet, TAKE 1 TABLET DAILY BEFORE BREAKFAST, Disp: 90 tablet, Rfl: 0   lisinopril  (ZESTRIL ) 5 MG tablet, Take 1 tablet (5 mg total) by mouth daily., Disp: 90 tablet, Rfl: 0   metoprolol  succinate (TOPROL -XL) 50 MG 24 hr tablet, TAKE 1 TABLET BY MOUTH EVERY DAY with OR immediately AFTER a meal, Disp: 90 tablet, Rfl: 3   nitroGLYCERIN  (NITROSTAT ) 0.4 MG SL tablet, Place 1 tablet (0.4 mg total) under the tongue every 5 (five) minutes x 3 doses as needed for chest pain (if no relief after 2nd dose, proceed to ED or call 911)., Disp: 25 tablet, Rfl: 3   pantoprazole  (PROTONIX ) 40 MG tablet, Take 1 tablet (40 mg total) by mouth daily.,  Disp: 90 tablet, Rfl: 3   tirzepatide  (MOUNJARO ) 7.5 MG/0.5ML Pen, Inject 7.5 mg into the skin once a week., Disp: 6 mL, Rfl: 3  Past Medical History: Past Medical History:  Diagnosis Date   Anxiety    CAD in native artery    a. cath in 2015 [facility] showing occluded proximal LAD, LAD filled with left-left and right-left collaterals, otherwise 20-30% prox-mid RCA, LVEF 55%. b. Abnormal nuc 02/2016 - [facility] medically. c. redo cath in 06/2017 showing 100% Prox LAD stenosis, 50% RCA, and 40% RPDA and underwent CTO PCI of the LAD with DESx2 to in 08/2017   Daily headache    since I started Plavix  (08/25/2017)   Depression    Diabetes mellitus type 2 in obese    Hyperlipidemia    Hypertension    OSA on CPAP     Tobacco Use: Social History   Tobacco Use  Smoking Status Every Day   Current packs/day: 1.00   Average packs/day: 1 pack/day for 31.0 years (31.0 ttl pk-yrs)   Types: Cigarettes   Start date: 01/03/1993  Smokeless Tobacco Never    Labs: Review Flowsheet  More data exists      Latest Ref Rng & Units 09/24/2022 12/31/2022 04/08/2023 07/08/2023 10/14/2023  Labs for ITP Cardiac and Pulmonary Rehab  Cholestrol 100 - 199 mg/dL - 891  899  - 897   LDL (calc) 0 - 99 mg/dL - 53  48  -  51   HDL-C >39 mg/dL - 34  31  - 34   Trlycerides 0 - 149 mg/dL - 883  886  - 87   Hemoglobin A1c 4.8 - 5.6 % 5.7  5.3  5.5  5.5  5.5      Exercise Target Goals: Exercise Program Goal: Individual exercise prescription set using results from initial 6 min walk test and THRR while considering  patient's activity barriers and safety.   Exercise Prescription Goal: Initial exercise prescription builds to 30-45 minutes a day of aerobic activity, 2-3 days per week.  Home exercise guidelines will be given to patient during program as part of exercise prescription that the participant will acknowledge.   Education: Aerobic Exercise: - Group verbal and visual presentation on the components of exercise  prescription. Introduces F.I.T.T principle from ACSM for exercise prescriptions.  Reviews F.I.T.T. principles of aerobic exercise including progression. Written material provided at class time.   Education: Resistance Exercise: - Group verbal and visual presentation on the components of exercise prescription. Introduces F.I.T.T principle from ACSM for exercise prescriptions  Reviews F.I.T.T. principles of resistance exercise including progression. Written material provided at class time.    Education: Exercise & Equipment Safety: - Individual verbal instruction and demonstration of equipment use and safety with use of the equipment.   Education: Exercise Physiology & General Exercise Guidelines: - Group verbal and written instruction with models to review the exercise physiology of the cardiovascular system and associated critical values. Provides general exercise guidelines with specific guidelines to those with heart or lung disease. Written material provided at class time. Flowsheet Row CARDIAC REHAB PHASE II EXERCISE from 12/16/2023 in [place] [facility]  Education need identified 11/15/23  Date 12/16/23  Educator [facility]  Instruction Review Code 1- [employer] Understanding    Education: Flexibility, Balance, Mind/Body Relaxation: - Group verbal and visual presentation with interactive activity on the components of exercise prescription. Introduces F.I.T.T principle from ACSM for exercise prescriptions. Reviews F.I.T.T. principles of flexibility and balance exercise training including progression. Also discusses the mind body connection.  Reviews various relaxation techniques to help reduce and manage stress (i.e. Deep breathing, progressive muscle relaxation, and visualization). Balance handout provided to take home. Written material provided at class time.   Activity Barriers & Risk Stratification:  Activity Barriers & Cardiac Risk Stratification - 11/12/23 1408        Activity Barriers & Cardiac Risk Stratification   Activity Barriers None    Cardiac Risk Stratification High          6 Minute Walk:  6 Minute Walk     Row Name 11/15/23 1147         6 Minute Walk   Phase Initial     Distance 1150 feet     Walk Time 6 minutes     # of Rest Breaks 0     MPH 2.18     METS 3.47     RPE 8     VO2 Peak 12.16     Symptoms No     Resting HR 97 bpm     Resting BP 110/80     Resting Oxygen Saturation  96 %     Exercise Oxygen Saturation  during 6 min walk 96 %     Max Ex. HR 111 bpm     Max Ex. BP 130/78     2 Minute Post BP 120/78        Oxygen Initial Assessment:   Oxygen Re-Evaluation:  Oxygen Discharge (Final Oxygen Re-Evaluation):   Initial Exercise Prescription:  Initial Exercise Prescription - 11/15/23 1100       Date of Initial Exercise RX and Referring Provider   Date 11/15/23    Referring Provider Anner Lenis MD   Alvan Carrier MD sending everything     Treadmill   MPH 1.8    Grade 0.5    Minutes 15    METs 2.5      NuStep   Level 5    SPM 50    Minutes 15    METs 2      Prescription Details   Frequency (times per week) 2    Duration Progress to 30 minutes of continuous aerobic without signs/symptoms of physical distress      Intensity   THRR 40-80% of Max Heartrate 126-156    Ratings of Perceived Exertion 11-13    Perceived Dyspnea 0-4      Resistance Training   Training Prescription Yes    Weight 5    Reps 10-15          Perform Capillary Blood Glucose checks as needed.  Exercise Prescription Changes:   Exercise Prescription Changes     Row Name 11/15/23 1100 11/18/23 1500 12/21/23 1500         Response to Exercise   Blood Pressure (Admit) 110/80 118/88 116/70     Blood Pressure (Exercise) 130/78 140/80 --     Blood Pressure (Exit) 120/78 140/100 124/80     Heart Rate (Admit) 97 bpm 65 bpm 78 bpm     Heart Rate (Exercise) 111 bpm 139 bpm 162 bpm     Heart Rate (Exit) 85 bpm 74  bpm 128 bpm     Oxygen Saturation (Admit) 96 % -- --     Oxygen Saturation (Exercise) 96 % -- --     Oxygen Saturation (Exit) 96 % -- --     Rating of Perceived Exertion (Exercise) 8 9 13      Duration -- Continue with 30 min of aerobic exercise without signs/symptoms of physical distress. Continue with 30 min of aerobic exercise without signs/symptoms of physical distress.     Intensity -- THRR unchanged THRR unchanged       Progression   Progression -- Continue to progress workloads to maintain intensity without signs/symptoms of physical distress. Continue to progress workloads to maintain intensity without signs/symptoms of physical distress.       Resistance Training   Training Prescription -- Yes Yes     Weight -- 5 5     Reps -- 10-15 10-15       Treadmill   MPH -- 2.5 3     Grade -- 1 4     Minutes -- 15 1     METs -- 3.26 4.95       NuStep   Level -- 8 8     SPM -- 81 98     Minutes -- 15 15     METs -- 4.4 6.2        Exercise Comments:   Exercise Comments     Row Name 11/16/23 1452           Exercise Comments First full day of exercise!  Patient was oriented to gym and equipment including functions, settings, policies, and procedures.  Patient's individual exercise prescription and treatment plan were reviewed.  All starting workloads were established based on the results of the 6 minute walk test done at  initial orientation visit.  The plan for exercise progression was also introduced and progression will be customized based on patient's performance and goals.          Exercise Goals and Review:   Exercise Goals     Row Name 11/15/23 1151             Exercise Goals   Increase Physical Activity Yes       Intervention Provide advice, education, support and counseling about physical activity/exercise needs.;Develop an individualized exercise prescription for aerobic and resistive training based on initial evaluation findings, risk stratification,  comorbidities and participant's personal goals.       Expected Outcomes Short Term: Attend rehab on a regular basis to increase amount of physical activity.;Long Term: Add in home exercise to make exercise part of routine and to increase amount of physical activity.;Long Term: Exercising regularly at least 3-5 days a week.       Increase Strength and Stamina Yes       Intervention Provide advice, education, support and counseling about physical activity/exercise needs.;Develop an individualized exercise prescription for aerobic and resistive training based on initial evaluation findings, risk stratification, comorbidities and participant's personal goals.       Expected Outcomes Short Term: Increase workloads from initial exercise prescription for resistance, speed, and METs.;Short Term: Perform resistance training exercises routinely during rehab and add in resistance training at home;Long Term: Improve cardiorespiratory fitness, muscular endurance and strength as measured by increased METs and functional capacity ( )       Able to understand and use rate of perceived exertion (RPE) scale Yes       Intervention Provide education and explanation on how to use RPE scale       Expected Outcomes Short Term: Able to use RPE daily in rehab to express subjective intensity level;Long Term:  Able to use RPE to guide intensity level when exercising independently       Able to understand and use Dyspnea scale Yes       Intervention Provide education and explanation on how to use Dyspnea scale       Expected Outcomes Short Term: Able to use Dyspnea scale daily in rehab to express subjective sense of shortness of breath during exertion;Long Term: Able to use Dyspnea scale to guide intensity level when exercising independently       Knowledge and understanding of Target Heart Rate Range (THRR) Yes       Intervention Provide education and explanation of THRR including how the numbers were predicted and where they  are located for reference       Expected Outcomes Long Term: Able to use THRR to govern intensity when exercising independently;Short Term: Able to state/look up THRR;Short Term: Able to use daily as guideline for intensity in rehab       Able to check pulse independently Yes       Intervention Provide education and demonstration on how to check pulse in carotid and radial arteries.;Review the importance of being able to check your own pulse for safety during independent exercise       Expected Outcomes Short Term: Able to explain why pulse checking is important during independent exercise;Long Term: Able to check pulse independently and accurately       Understanding of Exercise Prescription Yes       Intervention Provide education, explanation, and written materials on patient's individual exercise prescription       Expected Outcomes Short Term: Able to explain program  exercise prescription;Long Term: Able to explain home exercise prescription to exercise independently          Exercise Goals Re-Evaluation :  Exercise Goals Re-Evaluation     Row Name 11/16/23 1453 12/21/23 1531           Exercise Goal Re-Evaluation   Exercise Goals Review Able to understand and use rate of perceived exertion (RPE) scale;Knowledge and understanding of Target Heart Rate Range (THRR) Increase Physical Activity;Increase Strength and Stamina;Able to understand and use Dyspnea scale;Able to check pulse independently;Knowledge and understanding of Target Heart Rate Range (THRR);Able to understand and use rate of perceived exertion (RPE) scale;Understanding of Exercise Prescription      Comments Reviewed RPE and dyspnea scale, THR and program prescription with pt today.  Pt voiced understanding and was given a copy of goals to take home. Carl [name] is doing great in rehab! Carl [name] notes he is not exercising at home due to financial stress. In talking with him he notes his road being a private road, discussed with him to try  walking his road for exercise and it may possibily relieve some stress.      Expected Outcomes Short: Use RPE daily to regulate intensity.  Long: Follow program prescription in THR. Short: continue to attend rehab. Long: start walking private road for exercise         Discharge Exercise Prescription (Final Exercise Prescription Changes):  Exercise Prescription Changes - 12/21/23 1500       Response to Exercise   Blood Pressure (Admit) 116/70    Blood Pressure (Exit) 124/80    Heart Rate (Admit) 78 bpm    Heart Rate (Exercise) 162 bpm    Heart Rate (Exit) 128 bpm    Rating of Perceived Exertion (Exercise) 13    Duration Continue with 30 min of aerobic exercise without signs/symptoms of physical distress.    Intensity THRR unchanged      Progression   Progression Continue to progress workloads to maintain intensity without signs/symptoms of physical distress.      Resistance Training   Training Prescription Yes    Weight 5    Reps 10-15      Treadmill   MPH 3    Grade 4    Minutes 1    METs 4.95      NuStep   Level 8    SPM 98    Minutes 15    METs 6.2          Nutrition:  Target Goals: Understanding of nutrition guidelines, daily intake of sodium 1500mg , cholesterol 200mg , calories 30% from fat and 7% or less from saturated fats, daily to have 5 or more servings of fruits and vegetables.  Education: Nutrition 1 -Group instruction provided by verbal, written material, interactive activities, discussions, models, and posters to present general guidelines for heart healthy nutrition including macronutrients, label reading, and promoting whole foods over processed counterparts. Education serves as pensions consultant of discussion of heart healthy eating for all. Written material provided at class time. Flowsheet Row CARDIAC REHAB PHASE II EXERCISE from 12/16/2023 in [place] [facility]  Education need identified 11/15/23     Education: Nutrition 2 -Group  instruction provided by verbal, written material, interactive activities, discussions, models, and posters to present general guidelines for heart healthy nutrition including sodium, cholesterol, and saturated fat. Providing guidance of habit forming to improve blood pressure, cholesterol, and body weight. Written material provided at class time. Flowsheet Row CARDIAC REHAB PHASE II EXERCISE from 12/16/2023  in [employer] [facility]IT[facility]  Education need identified 11/15/23      Biometrics:  Pre Biometrics - 11/15/23 1152       Pre Biometrics   Height 5' 11 (1.803 m)    Weight 249 lb 9 oz (113.2 kg)    Waist Circumference 46 inches    Hip Circumference 43 inches    Waist to Hip Ratio 1.07 %    BMI (Calculated) 34.82    Grip Strength 35.6 kg    Single Leg Stand 30 seconds           Nutrition Therapy Plan and Nutrition Goals:   Nutrition Assessments:  MEDIFICTS Score Key: >=70 Need to make dietary changes  40-70 Heart Healthy Diet <= 40 Therapeutic Level Cholesterol Diet  Flowsheet Row CARDIAC REHAB PHASE II ORIENTATION from 11/15/2023 in [facility] CARDIAC REHABILITATION  Picture Your Plate Total Score on Admission 57   Picture Your Plate Scores: <59 Unhealthy dietary pattern with much room for improvement. 41-50 Dietary pattern unlikely to meet recommendations for good health and room for improvement. 51-60 More healthful dietary pattern, with some room for improvement.  >60 Healthy dietary pattern, although there may be some specific behaviors that could be improved.    Nutrition Goals Re-Evaluation:  Nutrition Goals Re-Evaluation     Row Name 12/21/23 1522             Goals   Nutrition Goal Healthy diet       Comment Carl [name] is not following any certain diet currently. Carl [name] is struggling financially at this time and he is only eating what he can afford. Spoke with him about possible resources for food.       Expected Outcome Short: reach out to food  banks if needed for more balanced meals. Long: Maintain a healthy well balanced diet.          Nutrition Goals Discharge (Final Nutrition Goals Re-Evaluation):  Nutrition Goals Re-Evaluation - 12/21/23 1522       Goals   Nutrition Goal Healthy diet    Comment Carl [name] is not following any certain diet currently. Carl [name] is struggling financially at this time and he is only eating what he can afford. Spoke with him about possible resources for food.    Expected Outcome Short: reach out to food banks if needed for more balanced meals. Long: Maintain a healthy well balanced diet.          Psychosocial: Target Goals: Acknowledge presence or absence of significant depression and/or stress, maximize coping skills, provide positive support system. Participant is able to verbalize types and ability to use techniques and skills needed for reducing stress and depression.   Education: Stress, Anxiety, and Depression - Group verbal and visual presentation to define topics covered.  Reviews how body is impacted by stress, anxiety, and depression.  Also discusses healthy ways to reduce stress and to treat/manage anxiety and depression. Written material provided at class time. Flowsheet Row CARDIAC REHAB PHASE II EXERCISE from 12/16/2023 in [place] [facility]  Education need identified 11/15/23    Education: Sleep Hygiene -Provides group verbal and written instruction about how sleep can affect your health.  Define sleep hygiene, discuss sleep cycles and impact of sleep habits. Review good sleep hygiene tips.   Initial Review & Psychosocial Screening:  Initial Psych Review & Screening - 11/12/23 1417       Initial Review   Current issues with Current Anxiety/Panic;History of Depression;Current Depression  Family Dynamics   Good Support System? No    Concerns No support system    Comments When ask who supports him, he states no one.      Barriers   Psychosocial barriers to  participate in program The patient should benefit from training in stress management and relaxation.;There are no identifiable barriers or psychosocial needs.      Screening Interventions   Interventions To provide support and resources with identified psychosocial needs;Provide feedback about the scores to participant;Encouraged to exercise    Expected Outcomes Short Term goal: Utilizing psychosocial counselor, staff and physician to assist with identification of specific Stressors or current issues interfering with healing process. Setting desired goal for each stressor or current issue identified.;Long Term Goal: Stressors or current issues are controlled or eliminated.;Short Term goal: Identification and review with participant of any Quality of Life or Depression concerns found by scoring the questionnaire.;Long Term goal: The participant improves quality of Life and PHQ9 Scores as seen by post scores and/or verbalization of changes          Quality of Life Scores:   Quality of Life - 11/15/23 1158       Quality of Life   Select Quality of Life      Quality of Life Scores   Health/Function Pre 15.27 %    Socioeconomic Pre 16.13 %    Psych/Spiritual Pre 12.21 %    Family Pre 15.5 %    GLOBAL Pre 14.89 %         Scores of 19 and below usually indicate a poorer quality of life in these areas.  A difference of  2-3 points is a clinically meaningful difference.  A difference of 2-3 points in the total score of the Quality of Life Index has been associated with significant improvement in overall quality of life, self-image, physical symptoms, and general health in studies assessing change in quality of life.  PHQ-9: Review Flowsheet  More data exists      11/15/2023 10/02/2021 06/26/2021 04/03/2021 01/02/2021  Depression screen PHQ 2/9  Decreased Interest 3 3 3 3 3   Down, Depressed, Hopeless 2 3 3 3 3   PHQ - 2 Score 5 6 6 6 6   Altered sleeping 0 3 3 3 3   Tired, decreased energy 0 3 3  3 3   Change in appetite 0 3 3 3 3   Feeling bad or failure about yourself  3 3 3 3 3   Trouble concentrating 0 3 3 3 3   Moving slowly or fidgety/restless 0 3 3 3 3   Suicidal thoughts 0 3 3 3 3   PHQ-9 Score 8 27 27 27 27   Difficult doing work/chores Not difficult at all - Extremely dIfficult - -   Interpretation of Total Score  Total Score Depression Severity:  1-4 = Minimal depression, 5-9 = Mild depression, 10-14 = Moderate depression, 15-19 = Moderately severe depression, 20-27 = Severe depression   Psychosocial Evaluation and Intervention:  Psychosocial Evaluation - 11/12/23 1418       Psychosocial Evaluation & Interventions   Interventions Stress management education;Relaxation education;Encouraged to exercise with the program and follow exercise prescription    Comments Patient referred to cardiac rehab with NSTEMI/Stent placement. He has depression and anxiety listed in his medical history but says he has no more than his usual. He is currently not being treated. He lives alone and says he has no support person. 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  Olivia Pavy, PA put lifting restrictions of no  more than 10 lbs at his visit and he said he could not work with these restrictions. He is a current smoker 0.5 ppd and vapes also and is not ready to quit. His goals for the program are to be able to return to work. He says he will still be able to come to sessions after he returns to work. He has no barriers identified to complete the program.    Expected Outcomes Short Term: Patient will start the program and attend consistently. Long Term: Patient will complete the program meeting personal goals.    Continue Psychosocial Services  Follow up required by staff          Psychosocial Re-Evaluation:  Psychosocial Re-Evaluation     Row Name 12/21/23 1516             Psychosocial Re-Evaluation   Current issues with Current Stress Concerns;Current Sleep  Concerns;Current Anxiety/Panic       Comments Donnis is doing great in rehab! 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 He notes not having any money, he has some friends who have helped him a little, but no family support. He notes being behind on his mortgage and that he doesn't have much food left. Spoke to him about possible resources available, but he also mentions he has some things he is going to sell to get some cash to help with bills. He notes he should be cleared for back to work soon within the next week or two.       Expected Outcomes Short: get cleared by MD to go back to work. Long: work on having healthy stress outlets and once back to work start to catch up on bills.       Interventions Encouraged to attend Cardiac Rehabilitation for the exercise       Continue Psychosocial Services  Follow up required by staff          Psychosocial Discharge (Final Psychosocial Re-Evaluation):  Psychosocial Re-Evaluation - 12/21/23 1516       Psychosocial Re-Evaluation   Current issues with Current Stress Concerns;Current Sleep Concerns;Current Anxiety/Panic    Comments Carl [name] is doing great in rehab! 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 He notes not having any money, he has some friends who have helped him a little, but no family support. He notes being behind on his mortgage and that he doesn't have much food left. Spoke to him about possible resources available, but he also mentions he has some things he is going to sell to get some cash to help with bills. He notes he should be cleared for back to work soon within the next week or two.    Expected Outcomes Short: get cleared by MD to go back to work. Long: work on having healthy stress outlets and once back to work start to catch up on bills.     Interventions Encouraged to attend Cardiac Rehabilitation for the exercise    Continue Psychosocial Services  Follow up required by staff          Vocational Rehabilitation: Provide vocational rehab assistance to qualifying candidates.   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          Education: Education Goals: Education classes will be provided on a variety of topics geared toward better understanding of heart health and risk factor modification. Participant will state understanding/return demonstration of topics presented as noted by education test scores.  Learning Barriers/Preferences:  Learning Barriers/Preferences - 11/12/23 1416       Learning Barriers/Preferences   Learning Barriers None    Learning Preferences Written Material;Audio;Skilled Demonstration          General Cardiac Education Topics:  AED/CPR: - Group verbal and written instruction with the use of models to demonstrate the basic use of the AED with the basic ABC's of resuscitation.   Test and Procedures: - Group verbal and visual presentation and models provide information about basic cardiac anatomy and function. Reviews the testing methods done to diagnose heart disease and the outcomes of the test results. Describes the treatment choices: Medical Management, Angioplasty, or Coronary Bypass Surgery for treating various heart conditions including Myocardial Infarction, Angina, Valve Disease, and Cardiac Arrhythmias. Written material provided at class time.   Medication Safety: - Group verbal and visual instruction to review commonly prescribed medications for heart and lung disease. Reviews the medication, class of the drug, and side effects.  Includes the steps to properly store meds and maintain the prescription regimen. Written material provided at class time. Flowsheet Row CARDIAC REHAB PHASE II EXERCISE from 12/16/2023 in [facility]  Date 11/25/23  Educator dj  Instruction Review Code 2- Demonstrated Understanding    Intimacy: - Group verbal instruction through game format to discuss how heart and lung disease can affect sexual intimacy. Written material provided at class time.   Know Your Numbers and Heart Failure: - Group verbal and visual instruction to discuss disease risk factors for cardiac and pulmonary disease and treatment options.  Reviews associated critical values for Overweight/Obesity, Hypertension, Cholesterol, and Diabetes.  Discusses basics of heart failure: signs/symptoms and treatments.  Introduces Heart Failure Zone chart for action plan for heart failure. Written material provided at class time.   Infection Prevention: - Provides verbal and written material to individual with discussion of infection control including proper hand washing and proper equipment cleaning during exercise session.   Falls Prevention: - Provides verbal and written material to individual with discussion of falls prevention and safety.   Other: -Provides group and verbal instruction on various topics (see comments)   Knowledge Questionnaire Score:   Core Components/Risk Factors/Patient Goals at Admission:  Personal Goals and Risk Factors at Admission - 11/12/23 1416       Core Components/Risk Factors/Patient Goals on Admission    Weight Management Obesity    Diabetes Yes    Intervention Provide education about signs/symptoms and action to take for hypo/hyperglycemia.;Provide education about proper nutrition, including hydration, and aerobic/resistive exercise prescription along with prescribed medications to achieve blood glucose in normal ranges: Fasting glucose 65-99 mg/dL    Expected Outcomes  Short Term: Participant verbalizes understanding of the signs/symptoms and immediate care of hyper/hypoglycemia, proper foot care and importance of medication, aerobic/resistive exercise and nutrition plan for blood glucose control.;Long Term: Attainment of HbA1C < 7%.    Hypertension Yes    Intervention Provide education on lifestyle modifcations including regular physical activity/exercise, weight management, moderate sodium restriction and increased consumption of fresh fruit, vegetables, and low  fat dairy, alcohol moderation, and smoking cessation.;Monitor prescription use compliance.    Expected Outcomes Short Term: Continued assessment and intervention until BP is < 140/38mm HG in hypertensive participants. < 130/62mm HG in hypertensive participants with diabetes, heart failure or chronic kidney disease.;Long Term: Maintenance of blood pressure at goal levels.    Lipids Yes    Intervention Provide education and support for participant on nutrition & aerobic/resistive exercise along with prescribed medications to achieve LDL 70mg , HDL >40mg .    Expected Outcomes Short Term: Participant states understanding of desired cholesterol values and is compliant with medications prescribed. Participant is following exercise prescription and nutrition guidelines.;Long Term: Cholesterol controlled with medications as prescribed, with individualized exercise RX and with personalized nutrition plan. Value goals: LDL < 70mg , HDL > 40 mg.          Education:Diabetes - Individual verbal and written instruction to review signs/symptoms of diabetes, desired ranges of glucose level fasting, after meals and with exercise. Acknowledge that pre and post exercise glucose checks will be done for 3 sessions at entry of program.   Core Components/Risk Factors/Patient Goals Review:   Goals and Risk Factor Review     Row Name 12/21/23 1525             Core Components/Risk Factors/Patient Goals Review   Personal  Goals Review Hypertension;Lipids;Diabetes       Review Carl [name] is struggling financially. Carl [name] notes that he got 90 day supply of all of his meds right before his money ran out after not being able to go back to work. So he notes having a taking all of his medications. He is also making sure to keep all his follow ups with his doctors so he can get back to work asap.       Expected Outcomes Short: Get back to work soon. Long: continue to take medications as prescribed by MD.          Core Components/Risk Factors/Patient Goals at Discharge (Final Review):   Goals and Risk Factor Review - 12/21/23 1525       Core Components/Risk Factors/Patient Goals Review   Personal Goals Review Hypertension;Lipids;Diabetes    Review Carl [name] is struggling financially. Carl [name] notes that he got 90 day supply of all of his meds right before his money ran out after not being able to go back to work. So he notes having a taking all of his medications. He is also making sure to keep all his follow ups with his doctors so he can get back to work asap.    Expected Outcomes Short: Get back to work soon. Long: continue to take medications as prescribed by MD.          ITP Comments:  ITP Comments     Row Name 11/12/23 1429 11/15/23 1157 11/16/23 1452 12/01/23 1816 12/29/23 0924   ITP Comments Virtual orientation visit completed for cardiac rehab with NSTEMI/Stent placement. On-site orientation visit scheduled for 11/15/23 at 10:30. Patient arrived for 1st visit/orientation/education at 1030. Patient was referred to CR by Alm Harding/Dr. Branch attending due to NSTEMI/Stent placement. During orientation advised patient on arrival and appointment times what to wear, what to do before, during and after exercise. Reviewed attendance and class policy.  Pt is scheduled to return Cardiac Rehab on 11/16/23 at 1500. Pt was advised to come to class 15 minutes before class starts.  Discussed RPE/Dpysnea scales. Patient participated in  warm up stretches. Patient was able to complete 6 minute walk test.  Telemetry:NSR. Patient was measured for the equipment. Discussed equipment safety with patient. Took patient pre-anthropometric measurements. Patient finished visit at 1145. First full day of exercise!  Patient was oriented to gym and equipment including functions, settings, policies, and procedures.  Patient's individual exercise prescription and treatment plan were reviewed.  All starting workloads were established based on the results of the 6 minute walk test done at initial orientation visit.  The plan for exercise progression was also introduced and progression will be customized based on patient's performance and goals. 30 day review completed. ITP sent to Dr. Dorn Ross, Medical Director of Cardiac Rehab. Continue with ITP unless changes are made by physician.  Newer to program 30 day review completed. ITP sent to Dr. Dorn Ross, Medical Director of Cardiac Rehab. Continue with ITP unless changes are made by physician.      Comments: 30 day review

## 2023-12-30 ENCOUNTER — Encounter (HOSPITAL_COMMUNITY)
Admission: RE | Admit: 2023-12-30 | Discharge: 2023-12-30 | Disposition: A | Source: Ambulatory Visit | Attending: Cardiology

## 2023-12-30 DIAGNOSIS — I214 Non-ST elevation (NSTEMI) myocardial infarction: Secondary | ICD-10-CM

## 2023-12-30 DIAGNOSIS — Z955 Presence of coronary angioplasty implant and graft: Secondary | ICD-10-CM

## 2023-12-30 NOTE — Progress Notes (Signed)
 Daily Session Note  Patient Details  Name: Carl [name] MRN: [id number] Date of Birth: 07-24-1975 Referring Provider:   Flowsheet Row CARDIAC REHAB PHASE II ORIENTATION from 11/15/2023 in [facility] CARD[facility]AC REHABILITATION  Referring Provider Anner Lenis MD  Suzzette Carrier MD sending everything]    Encounter Date: 12/30/2023  Check In:  Session Check In - 12/30/23 1505       Check-In   Supervising physician immediately available to respond to emergencies See telemetry face sheet for immediately available MD    Location [facility]    Staff Present Adrien Louder, RN, BSN;Hillary Troutman BSN, RN;Heather Con, BS, Exercise Physiologist    Virtual Visit No    Medication changes reported     No    Fall or balance concerns reported    No    Warm-up and Cool-down Performed on first and last piece of equipment    Resistance Training Performed Yes    VAD Patient? No    PAD/SET Patient? No      Pain Assessment   Currently in Pain? No/denies    Pain Score 0-No pain    Multiple Pain Sites No          Capillary Blood Glucose: No results found for this or any previous visit (from the past 24 hours).    Social History   Tobacco Use  Smoking Status Every Day   Current packs/day: 1.00   Average packs/day: 1 pack/day for 31.0 years (31.0 ttl pk-yrs)   Types: Cigarettes   Start date: 01/03/1993  Smokeless Tobacco Never    Goals Met:  Independence with exercise equipment Exercise tolerated well No report of concerns or symptoms today Strength training completed today  Goals Unmet:  Not Applicable  Comments: Pt able to follow exercise prescription today without complaint.  Will continue to monitor for progression.

## 2024-01-04 ENCOUNTER — Encounter (HOSPITAL_COMMUNITY)
Admission: RE | Admit: 2024-01-04 | Discharge: 2024-01-04 | Disposition: A | Discharge status: Home / Self Care | Source: Ambulatory Visit | Attending: Cardiology | Admitting: Cardiology

## 2024-01-04 DIAGNOSIS — Z955 Presence of coronary angioplasty implant and graft: Secondary | ICD-10-CM

## 2024-01-04 DIAGNOSIS — I214 Non-ST elevation (NSTEMI) myocardial infarction: Secondary | ICD-10-CM | POA: Diagnosis not present

## 2024-01-04 NOTE — Progress Notes (Signed)
 Daily Session Note  Patient Details  Name: Carl [name] MRN: [id number] Date of Birth: Dec 30, 1975 Referring Provider:   Flowsheet Row CARDIAC REHAB PHASE II ORIENTATION from 11/15/2023 in [facility] CARDIAC REHABILIT[facility]  Referring Provider Anner Lenis MD  Suzzette Carrier MD sending everything]    Encounter Date: 01/04/2024  Check In:  Session Check In - 01/04/24 1458       Check-In   Supervising physician immediately available to respond to emergencies See telemetry face sheet for immediately available MD    Location [facility]    Staff Present Powell Benders, BS, Exercise Physiologist;Brittany Jackquline, BSN, RN, WTA-C;Yaire Kreher Zina, RN    Virtual Visit No    Medication changes reported     No    Fall or balance concerns reported    No    Warm-up and Cool-down Performed on first and last piece of equipment    Resistance Training Performed Yes    VAD Patient? No    PAD/SET Patient? No      Pain Assessment   Currently in Pain? No/denies          Capillary Blood Glucose: No results found for this or any previous visit (from the past 24 hours).    Social History   Tobacco Use  Smoking Status Every Day   Current packs/day: 1.00   Average packs/day: 1 pack/day for 31.0 years (31.0 ttl pk-yrs)   Types: Cigarettes   Start date: 01/03/1993  Smokeless Tobacco Never    Goals Met:  Independence with exercise equipment Exercise tolerated well No report of concerns or symptoms today Strength training completed today  Goals Unmet:  Not Applicable  Comments: Pt able to follow exercise prescription today without complaint.  Will continue to monitor for progression.

## 2024-01-06 ENCOUNTER — Encounter (HOSPITAL_COMMUNITY)
Admission: RE | Admit: 2024-01-06 | Discharge: 2024-01-06 | Disposition: A | Discharge status: Home / Self Care | Source: Ambulatory Visit | Attending: Cardiology | Admitting: Cardiology

## 2024-01-06 DIAGNOSIS — I214 Non-ST elevation (NSTEMI) myocardial infarction: Secondary | ICD-10-CM

## 2024-01-06 DIAGNOSIS — Z955 Presence of coronary angioplasty implant and graft: Secondary | ICD-10-CM

## 2024-01-06 NOTE — Progress Notes (Signed)
 Daily Session Note  Patient Details  Name: Carl [name] MRN: [id number] Date of Birth: [birth year] Referring Provider:   Flowsheet Row CARDIAC REHAB PHASE II ORIENTATION from 11/15/2023 in [facility] CARDIAC REHABILITATION  Referring Provider Anner Lenis MD  Suzzette Carrier MD sending everything]    Encounter Date: 01/06/2024  Check In:  Session Check In - 01/06/24 1440       Check-In   Supervising physician immediately available to respond to emergencies See telemetry face sheet for immediately available MD    Location [facility]    Staff Present Rolland Sake BSN, RN;Carolie Mcilrath Vicci, RN, BSN;Heather Con, BS, Exercise Physiologist    Virtual Visit No    Medication changes reported     No    Fall or balance concerns reported    No    Warm-up and Cool-down Performed on first and last piece of equipment    Resistance Training Performed Yes    VAD Patient? No    PAD/SET Patient? No      Pain Assessment   Currently in Pain? No/denies    Pain Score 0-No pain    Multiple Pain Sites No          Capillary Blood Glucose: No results found for this or any previous visit (from the past 24 hours).    Social History   Tobacco Use  Smoking Status Every Day   Current packs/day: 1.00   Average packs/day: 1 pack/day for 31.0 years (31.0 ttl pk-yrs)   Types: Cigarettes   Start date: 01/03/1993  Smokeless Tobacco Never    Goals Met:  Independence with exercise equipment Exercise tolerated well No report of concerns or symptoms today Strength training completed today  Goals Unmet:  Not Applicable  Comments: Pt able to follow exercise prescription today without complaint.  Will continue to monitor for progression.

## 2024-01-10 ENCOUNTER — Other Ambulatory Visit: Payer: Self-pay | Admitting: Family Medicine

## 2024-01-10 ENCOUNTER — Other Ambulatory Visit: Payer: Self-pay | Admitting: Cardiology

## 2024-01-11 ENCOUNTER — Encounter (HOSPITAL_COMMUNITY)
Admission: RE | Admit: 2024-01-11 | Discharge: 2024-01-11 | Disposition: A | Source: Ambulatory Visit | Attending: Cardiology

## 2024-01-11 DIAGNOSIS — I214 Non-ST elevation (NSTEMI) myocardial infarction: Secondary | ICD-10-CM

## 2024-01-11 DIAGNOSIS — Z955 Presence of coronary angioplasty implant and graft: Secondary | ICD-10-CM

## 2024-01-11 NOTE — Progress Notes (Signed)
 Daily Session Note  Patient Details  Name: Carl [name] MRN: [id number] Date of Birth: 1975-05-14 Referring Provider:   Flowsheet Row CARDIAC REHAB PHASE II ORIENTATION from 11/15/2023 in [facility] CARDIAC REHABILITATION  Referring Provider Anner Lenis MD  Suzzette Carrier MD sending everything]    Encounter Date: 01/11/2024  Check In:  Session Check In - 01/11/24 1501       Check-In   Supervising physician immediately available to respond to emergencies See telemetry face sheet for immediately available MD    Location [facility]    Staff Present Powell Benders, BS, Exercise Physiologist;Brittany Jackquline, BSN, RN, WTA-C;Sharisa Toves Zina, RN    Virtual Visit No    Medication changes reported     No    Fall or balance concerns reported    No    Warm-up and Cool-down Performed on first and last piece of equipment    Resistance Training Performed Yes    VAD Patient? No    PAD/SET Patient? No      Pain Assessment   Currently in Pain? No/denies          Capillary Blood Glucose: No results found for this or any previous visit (from the past 24 hours).    Social History   Tobacco Use  Smoking Status Every Day   Current packs/day: 1.00   Average packs/day: 1 pack/day for 31.0 years (31.0 ttl pk-yrs)   Types: Cigarettes   Start date: 01/03/1993  Smokeless Tobacco Never    Goals Met:  Independence with exercise equipment Exercise tolerated well No report of concerns or symptoms today Strength training completed today  Goals Unmet:  Not Applicable  Comments: Pt able to follow exercise prescription today without complaint.  Will continue to monitor for progression.

## 2024-01-13 ENCOUNTER — Encounter (HOSPITAL_COMMUNITY)
Admission: RE | Admit: 2024-01-13 | Discharge: 2024-01-13 | Disposition: A | Discharge status: Home / Self Care | Source: Ambulatory Visit | Attending: Cardiology | Admitting: Cardiology

## 2024-01-13 DIAGNOSIS — I214 Non-ST elevation (NSTEMI) myocardial infarction: Secondary | ICD-10-CM | POA: Diagnosis not present

## 2024-01-13 DIAGNOSIS — Z955 Presence of coronary angioplasty implant and graft: Secondary | ICD-10-CM

## 2024-01-13 NOTE — Progress Notes (Signed)
 Daily Session Note  Patient Details  Name: Carl [name] MRN: [id number] Date of Birth: [birth year] Referring Provider:   Flowsheet Row CARDIAC REHAB PHASE II ORIENTATION from 11/15/2023 in [facility] CARDIAC REHABILITATION  Referring Provider Anner Lenis MD  Suzzette Carrier MD sending everything]    Encounter Date: 01/13/2024  Check In:  Session Check In - 01/13/24 1451       Check-In   Supervising physician immediately available to respond to emergencies See telemetry face sheet for immediately available MD    Location [facility]    Staff Present Harlene Gelineau, MA, RCEP, CCRP, CCET;Heather Con, BS, Exercise Physiologist;Debra Vicci, RN, BSN;Ashlynne Shetterly BSN, RN    Virtual Visit No    Medication changes reported     No    Fall or balance concerns reported    No    Tobacco Cessation No Change    Warm-up and Cool-down Performed on first and last piece of equipment    Resistance Training Performed Yes    VAD Patient? No    PAD/SET Patient? No      Pain Assessment   Currently in Pain? No/denies    Pain Score 0-No pain    Multiple Pain Sites No          Capillary Blood Glucose: No results found for this or any previous visit (from the past 24 hours).    Social History   Tobacco Use  Smoking Status Every Day   Current packs/day: 1.00   Average packs/day: 1 pack/day for 31.0 years (31.0 ttl pk-yrs)   Types: Cigarettes   Start date: 01/03/1993  Smokeless Tobacco Never    Goals Met:  Independence with exercise equipment Exercise tolerated well No report of concerns or symptoms today Strength training completed today  Goals Unmet:  Not Applicable  Comments: [id number] able to follow exercise prescription today without complaint.  Will continue to monitor for progression.

## 2024-01-18 ENCOUNTER — Encounter (HOSPITAL_COMMUNITY)
Admission: RE | Admit: 2024-01-18 | Discharge: 2024-01-18 | Disposition: A | Discharge status: Home / Self Care | Source: Ambulatory Visit | Attending: Cardiology | Admitting: Cardiology

## 2024-01-18 DIAGNOSIS — I214 Non-ST elevation (NSTEMI) myocardial infarction: Secondary | ICD-10-CM | POA: Diagnosis not present

## 2024-01-18 DIAGNOSIS — Z955 Presence of coronary angioplasty implant and graft: Secondary | ICD-10-CM

## 2024-01-18 NOTE — Progress Notes (Signed)
 Daily Session Note  Patient Details  Name: Carl [name] MRN: [id number] Date of Birth: 1975/08/26 Referring Provider:   Flowsheet Row CARDIAC REHAB PHASE II ORIENTATION from 11/15/2023 in [facility] CARDIAC REHABILITATION  Referring Provider Anner Lenis MD  Suzzette Carrier MD sending everything]    Encounter Date: 01/18/2024  Check In:  Session Check In - 01/18/24 1512       Check-In   Supervising physician immediately available to respond to emergencies See telemetry face sheet for immediately available MD    Location [facility]    Staff Present Adrien Louder, RN, BSN;Mikenzie Mccannon Con, BS, Exercise Physiologist;Victoria Zina, RN    Virtual Visit No    Medication changes reported     No    Fall or balance concerns reported    No    Tobacco Cessation No Change    Warm-up and Cool-down Performed on first and last piece of equipment    Resistance Training Performed Yes    VAD Patient? No    PAD/SET Patient? No      Pain Assessment   Currently in Pain? No/denies    Pain Score 0-No pain    Multiple Pain Sites No          Capillary Blood Glucose: No results found for this or any previous visit (from the past 24 hours).    Social History   Tobacco Use  Smoking Status Every Day   Current packs/day: 1.00   Average packs/day: 1 pack/day for 31.0 years (31.0 ttl pk-yrs)   Types: Cigarettes   Start date: 01/03/1993  Smokeless Tobacco Never    Goals Met:  Independence with exercise equipment Exercise tolerated well No report of concerns or symptoms today Strength training completed today  Goals Unmet:  Not Applicable  Comments: Pt able to follow exercise prescription today without complaint.  Will continue to monitor for progression.

## 2024-01-19 ENCOUNTER — Other Ambulatory Visit (HOSPITAL_COMMUNITY): Payer: Self-pay

## 2024-01-25 ENCOUNTER — Encounter (HOSPITAL_COMMUNITY)
Admission: RE | Admit: 2024-01-25 | Discharge: 2024-01-25 | Disposition: A | Source: Ambulatory Visit | Attending: Cardiology

## 2024-01-25 ENCOUNTER — Encounter (HOSPITAL_COMMUNITY): Payer: Self-pay | Admitting: *Deleted

## 2024-01-25 DIAGNOSIS — I214 Non-ST elevation (NSTEMI) myocardial infarction: Secondary | ICD-10-CM | POA: Diagnosis present

## 2024-01-25 DIAGNOSIS — Z955 Presence of coronary angioplasty implant and graft: Secondary | ICD-10-CM | POA: Insufficient documentation

## 2024-01-25 NOTE — Progress Notes (Signed)
 Daily Session Note  Patient Details  Name: Carl [name] MRN: [id number] Date of Birth: 11/07/1975 Referring Provider:   Flowsheet Row CARDIAC REHAB PHASE II ORIENTATION from 11/15/2023 in [facility] CARDIAC REHABILITATION  Referring Provider Anner Lenis MD  Suzzette Carrier MD sending everything]    Encounter Date: 01/25/2024  Check In:  Session Check In - 01/25/24 1442       Check-In   Supervising physician immediately available to respond to emergencies See telemetry face sheet for immediately available MD    Location [facility]    Staff Present Laymon Rattler, BSN, RN, WTA-C;Victoria Zina, RN    Virtual Visit No    Medication changes reported     No    Fall or balance concerns reported    No    Tobacco Cessation No Change    Warm-up and Cool-down Performed on first and last piece of equipment    Resistance Training Performed Yes    VAD Patient? No    PAD/SET Patient? No      Pain Assessment   Currently in Pain? No/denies          Capillary Blood Glucose: No results found for this or any previous visit (from the past 24 hours).    Social History   Tobacco Use  Smoking Status Every Day   Current packs/day: 1.00   Average packs/day: 1 pack/day for 31.1 years (31.1 ttl pk-yrs)   Types: Cigarettes   Start date: 01/03/1993  Smokeless Tobacco Never    Goals Met:  Independence with exercise equipment Exercise tolerated well No report of concerns or symptoms today Strength training completed today  Goals Unmet:  Not Applicable  Comments: Pt able to follow exercise prescription today without complaint.  Will continue to monitor for progression.

## 2024-01-25 NOTE — Progress Notes (Signed)
 Cardiac Individual Treatment Plan  Patient Details  Name: TASHAUN OBEY MRN: [id number] Date of Birth: 1975/07/22 Referring Provider:   Flowsheet Row CARDIAC REHAB PHASE II ORIENTATION from 11/15/2023 in [facility] CARDIAC REHABILITATION  Referring Provider Anner Lenis MD  Suzzette Carrier MD sending everything]    Initial Encounter Date:  Flowsheet Row CARDIAC REHAB PHASE II ORIENTATION from 11/15/2023 in [place] CARDIAC REHABILITATION  Date 11/15/23    Visit Diagnosis: NSTEMI (non-ST elevated myocardial infarction) [facility])  Status post coronary artery stent placement  Patient's Home Medications on Admission:  Current Outpatient Medications:    amLODipine  (NORVASC ) 2.5 MG tablet, TAKE 1 TABLET BY MOUTH DAILY, Disp: 90 tablet, Rfl: 3   Ascorbic Acid (VITAMIN C) 1000 MG tablet, Take 2,000 mg by mouth daily., Disp: , Rfl:    aspirin  EC 81 MG tablet, Take 81 mg by mouth daily. , Disp: , Rfl:    atorvastatin  (LIPITOR ) 80 MG tablet, TAKE 1 TABLET BY MOUTH DAILY, Disp: 90 tablet, Rfl: 3   clopidogrel  (PLAVIX ) 75 MG tablet, Take 1 tablet (75 mg total) by mouth daily with breakfast., Disp: 90 tablet, Rfl: 3   empagliflozin  (JARDIANCE ) 10 MG TABS tablet, TAKE 1 TABLET DAILY BEFORE BREAKFAST, Disp: 90 tablet, Rfl: 0   lisinopril  (ZESTRIL ) 5 MG tablet, Take 1 tablet (5 mg total) by mouth daily., Disp: 90 tablet, Rfl: 0   metoprolol  succinate (TOPROL -XL) 50 MG 24 hr tablet, TAKE 1 TABLET BY MOUTH EVERY DAY with OR immediately AFTER a meal, Disp: 90 tablet, Rfl: 3   nitroGLYCERIN  (NITROSTAT ) 0.4 MG SL tablet, Place 1 tablet (0.4 mg total) under the tongue every 5 (five) minutes x 3 doses as needed for chest pain (if no relief after 2nd dose, proceed to ED or call 911)., Disp: 25 tablet, Rfl: 3   pantoprazole  (PROTONIX ) 40 MG tablet, Take 1 tablet (40 mg total) by mouth daily., Disp: 90 tablet, Rfl: 3   tirzepatide  (MOUNJARO ) 7.5 MG/0.5ML Pen, Inject 7.5 mg into the skin once a week., Disp: 6  mL, Rfl: 3  Past Medical History: Past Medical History:  Diagnosis Date   Anxiety    CAD in native artery    a. cath in 2015 [facility] showing occluded proximal LAD, LAD filled with left-left and right-left collaterals, otherwise 20-30% prox-mid RCA, LVEF 55%. b. Abnormal nuc 02/2016 - [facility] medically. c. redo cath in 06/2017 showing 100% Prox LAD stenosis, 50% RCA, and 40% RPDA and underwent CTO PCI of the LAD with DESx2 to in 08/2017   Daily headache    since I started Plavix  (08/25/2017)   Depression    Diabetes mellitus type 2 in obese    Hyperlipidemia    Hypertension    OSA on CPAP     Tobacco Use: Social History   Tobacco Use  Smoking Status Every Day   Current packs/day: 1.00   Average packs/day: 1 pack/day for 31.1 years (31.1 ttl pk-yrs)   Types: Cigarettes   Start date: 01/03/1993  Smokeless Tobacco Never    Labs: Review Flowsheet  More data exists      Latest Ref Rng & Units 09/24/2022 12/31/2022 04/08/2023 07/08/2023 10/14/2023  Labs for ITP Cardiac and Pulmonary Rehab  Cholestrol 100 - 199 mg/dL - 891  899  - 897   LDL (calc) 0 - 99 mg/dL - 53  48  - 51   HDL-C >39 mg/dL - 34  31  - 34   Trlycerides 0 - 149 mg/dL - 883  113  - 87   Hemoglobin A1c 4.8 - 5.6 % 5.7  5.3  5.5  5.5  5.5     Capillary Blood Glucose: Lab Results  Component Value Date   GLUCAP 100 (H) 10/28/2023   GLUCAP 93 10/27/2023   GLUCAP 79 10/27/2023   GLUCAP 83 10/27/2023   GLUCAP 92 06/02/2023     Exercise Target Goals: Exercise Program Goal: Individual exercise prescription set using results from initial 6 min walk test and THRR while considering  patient's activity barriers and safety.   Exercise Prescription Goal: Starting with aerobic activity 30 plus minutes a day, 3 days per week for initial exercise prescription. Provide home exercise prescription and guidelines that participant acknowledges understanding prior to discharge.  Activity Barriers & Risk Stratification:  Activity  Barriers & Cardiac Risk Stratification - 11/12/23 1408       Activity Barriers & Cardiac Risk Stratification   Activity Barriers None    Cardiac Risk Stratification High          6 Minute Walk:  6 Minute Walk     Row Name 11/15/23 1147         6 Minute Walk   Phase Initial     Distance 1150 feet     Walk Time 6 minutes     # of Rest Breaks 0     MPH 2.18     METS 3.47     RPE 8     VO2 Peak 12.16     Symptoms No     Resting HR 97 bpm     Resting BP 110/80     Resting Oxygen Saturation  96 %     Exercise Oxygen Saturation  during 6 min walk 96 %     Max Ex. HR 111 bpm     Max Ex. BP 130/78     2 Minute Post BP 120/78        Oxygen Initial Assessment:   Oxygen Re-Evaluation:   Oxygen Discharge (Final Oxygen Re-Evaluation):   Initial Exercise Prescription:  Initial Exercise Prescription - 11/15/23 1100       Date of Initial Exercise RX and Referring Provider   Date 11/15/23    Referring Provider Anner Lenis MD   Alvan Carrier MD sending everything     Treadmill   MPH 1.8    Grade 0.5    Minutes 15    METs 2.5      NuStep   Level 5    SPM 50    Minutes 15    METs 2      Prescription Details   Frequency (times per week) 2    Duration Progress to 30 minutes of continuous aerobic without signs/symptoms of physical distress      Intensity   THRR 40-80% of Max Heartrate 126-156    Ratings of Perceived Exertion 11-13    Perceived Dyspnea 0-4      Resistance Training   Training Prescription Yes    Weight 5    Reps 10-15          Perform Capillary Blood Glucose checks as needed.  Exercise Prescription Changes:   Exercise Prescription Changes     Row Name 11/15/23 1100 11/18/23 1500 12/21/23 1500 01/13/24 1500       Response to Exercise   Blood Pressure (Admit) 110/80 118/88 116/70 134/70    Blood Pressure (Exercise) 130/78 140/80 -- --    Blood Pressure (Exit) 120/78 140/100 124/80 126/68  Heart Rate (Admit) 97 bpm 65 bpm  78 bpm 563 bpm    Heart Rate (Exercise) 111 bpm 139 bpm 162 bpm 138 bpm    Heart Rate (Exit) 85 bpm 74 bpm 128 bpm 112 bpm    Oxygen Saturation (Admit) 96 % -- -- --    Oxygen Saturation (Exercise) 96 % -- -- --    Oxygen Saturation (Exit) 96 % -- -- --    Rating of Perceived Exertion (Exercise) 8 9 13 14     Duration -- Continue with 30 min of aerobic exercise without signs/symptoms of physical distress. Continue with 30 min of aerobic exercise without signs/symptoms of physical distress. Continue with 30 min of aerobic exercise without signs/symptoms of physical distress.    Intensity -- THRR unchanged THRR unchanged THRR unchanged      Progression   Progression -- Continue to progress workloads to maintain intensity without signs/symptoms of physical distress. Continue to progress workloads to maintain intensity without signs/symptoms of physical distress. Continue to progress workloads to maintain intensity without signs/symptoms of physical distress.      Resistance Training   Training Prescription -- Yes Yes Yes    Weight -- 5 5 5     Reps -- 10-15 10-15 10-15      Treadmill   MPH -- 2.5 3 2.8    Grade -- 1 4 8     Minutes -- 15 1 15     METs -- 3.26 4.95 6.23      NuStep   Level -- 8 8 8     SPM -- 81 98 94    Minutes -- 15 15 15     METs -- 4.4 6.2 5       Exercise Comments:   Exercise Comments     Row Name 11/16/23 1452           Exercise Comments First full day of exercise!  Patient was oriented to gym and equipment including functions, settings, policies, and procedures.  Patient's individual exercise prescription and treatment plan were reviewed.  All starting workloads were established based on the results of the 6 minute walk test done at initial orientation visit.  The plan for exercise progression was also introduced and progression will be customized based on patient's performance and goals.          Exercise Goals and Review:   Exercise Goals     Row Name  11/15/23 1151             Exercise Goals   Increase Physical Activity Yes       Intervention Provide advice, education, support and counseling about physical activity/exercise needs.;Develop an individualized exercise prescription for aerobic and resistive training based on initial evaluation findings, risk stratification, comorbidities and participant's personal goals.       Expected Outcomes Short Term: Attend rehab on a regular basis to increase amount of physical activity.;Long Term: Add in home exercise to make exercise part of routine and to increase amount of physical activity.;Long Term: Exercising regularly at least 3-5 days a week.       Increase Strength and Stamina Yes       Intervention Provide advice, education, support and counseling about physical activity/exercise needs.;Develop an individualized exercise prescription for aerobic and resistive training based on initial evaluation findings, risk stratification, comorbidities and participant's personal goals.       Expected Outcomes Short Term: Increase workloads from initial exercise prescription for resistance, speed, and METs.;Short Term: Perform resistance training exercises routinely during  rehab and add in resistance training at home;Long Term: Improve cardiorespiratory fitness, muscular endurance and strength as measured by increased METs and functional capacity ( )       Able to understand and use rate of perceived exertion (RPE) scale Yes       Intervention Provide education and explanation on how to use RPE scale       Expected Outcomes Short Term: Able to use RPE daily in rehab to express subjective intensity level;Long Term:  Able to use RPE to guide intensity level when exercising independently       Able to understand and use Dyspnea scale Yes       Intervention Provide education and explanation on how to use Dyspnea scale       Expected Outcomes Short Term: Able to use Dyspnea scale daily in rehab to express  subjective sense of shortness of breath during exertion;Long Term: Able to use Dyspnea scale to guide intensity level when exercising independently       Knowledge and understanding of Target Heart Rate Range (THRR) Yes       Intervention Provide education and explanation of THRR including how the numbers were predicted and where they are located for reference       Expected Outcomes Long Term: Able to use THRR to govern intensity when exercising independently;Short Term: Able to state/look up THRR;Short Term: Able to use daily as guideline for intensity in rehab       Able to check pulse independently Yes       Intervention Provide education and demonstration on how to check pulse in carotid and radial arteries.;Review the importance of being able to check your own pulse for safety during independent exercise       Expected Outcomes Short Term: Able to explain why pulse checking is important during independent exercise;Long Term: Able to check pulse independently and accurately       Understanding of Exercise Prescription Yes       Intervention Provide education, explanation, and written materials on patient's individual exercise prescription       Expected Outcomes Short Term: Able to explain program exercise prescription;Long Term: Able to explain home exercise prescription to exercise independently          Exercise Goals Re-Evaluation :  Exercise Goals Re-Evaluation     Row Name 11/16/23 1453 12/21/23 1531 01/11/24 1537         Exercise Goal Re-Evaluation   Exercise Goals Review Able to understand and use rate of perceived exertion (RPE) scale;Knowledge and understanding of Target Heart Rate Range (THRR) Increase Physical Activity;Increase Strength and Stamina;Able to understand and use Dyspnea scale;Able to check pulse independently;Knowledge and understanding of Target Heart Rate Range (THRR);Able to understand and use rate of perceived exertion (RPE) scale;Understanding of Exercise  Prescription Increase Physical Activity;Increase Strength and Stamina;Able to understand and use Dyspnea scale;Able to understand and use rate of perceived exertion (RPE) scale;Knowledge and understanding of Target Heart Rate Range (THRR);Able to check pulse independently;Understanding of Exercise Prescription     Comments Reviewed RPE and dyspnea scale, THR and program prescription with pt today.  Pt voiced understanding and was given a copy of goals to take home. Migel is doing great in rehab! Joevanni notes he is not exercising at home due to financial stress. In talking with him he notes his road being a private road, discussed with him to try walking his road for exercise and it may possibily relieve some stress. Detrich is doing great  in rehab! Bane is back to work now and he is trying to work as much as possible to make up money he lost being out. So he is not exercising much at home.     Expected Outcomes Short: Use RPE daily to regulate intensity.  Long: Follow program prescription in THR. Short: continue to attend rehab. Long: start walking private road for exercise Short: continue to attend rehab. Long: exercising daily for at least 30 minutes.         Discharge Exercise Prescription (Final Exercise Prescription Changes):  Exercise Prescription Changes - 01/13/24 1500       Response to Exercise   Blood Pressure (Admit) 134/70    Blood Pressure (Exit) 126/68    Heart Rate (Admit) 563 bpm    Heart Rate (Exercise) 138 bpm    Heart Rate (Exit) 112 bpm    Rating of Perceived Exertion (Exercise) 14    Duration Continue with 30 min of aerobic exercise without signs/symptoms of physical distress.    Intensity THRR unchanged      Progression   Progression Continue to progress workloads to maintain intensity without signs/symptoms of physical distress.      Resistance Training   Training Prescription Yes    Weight 5    Reps 10-15      Treadmill   MPH 2.8    Grade 8    Minutes 15    METs  6.23      NuStep   Level 8    SPM 94    Minutes 15    METs 5          Nutrition:  Target Goals: Understanding of nutrition guidelines, daily intake of sodium 1500mg , cholesterol 200mg , calories 30% from fat and 7% or less from saturated fats, daily to have 5 or more servings of fruits and vegetables.  Biometrics:  Pre Biometrics - 11/15/23 1152       Pre Biometrics   Height 5' 11 (1.803 m)    Weight 249 lb 9 oz (113.2 kg)    Waist Circumference 46 inches    Hip Circumference 43 inches    Waist to Hip Ratio 1.07 %    BMI (Calculated) 34.82    Grip Strength 35.6 kg    Single Leg Stand 30 seconds           Nutrition Therapy Plan and Nutrition Goals:   Nutrition Assessments:  MEDIFICTS Score Key: >=70 Need to make dietary changes  40-70 Heart Healthy Diet <= 40 Therapeutic Level Cholesterol Diet  Flowsheet Row CARDIAC REHAB PHASE II ORIENTATION from 11/15/2023 in [facility] CARDIAC RE[facility]ABILITATION  Picture Your Plate Total Score on Admission 57   Picture Your Plate Scores: <59 Unhealthy dietary pattern with much room for improvement. 41-50 Dietary pattern unlikely to meet recommendations for good health and room for improvement. 51-60 More healthful dietary pattern, with some room for improvement.  >60 Healthy dietary pattern, although there may be some specific behaviors that could be improved.    Nutrition Goals Re-Evaluation:  Nutrition Goals Re-Evaluation     Row Name 12/21/23 1522 01/11/24 1533           Goals   Nutrition Goal Healthy diet Healthy diet      Comment Emmauel is not following any certain diet currently. Rozell is struggling financially at this time and he is only eating what he can afford. Spoke with him about possible resources for food. Walden is eating better now that he has  more money for food. Spoke with him about healthy food options and try not to eat a lot of fast food and watch his salt intake.      Expected Outcome Short: reach  out to food banks if needed for more balanced meals. Long: Maintain a healthy well balanced diet. Short: monitor salt in diet. Long: eat healthy well balanced meals.         Nutrition Goals Discharge (Final Nutrition Goals Re-Evaluation):  Nutrition Goals Re-Evaluation - 01/11/24 1533       Goals   Nutrition Goal Healthy diet    Comment Graylon is eating better now that he has more money for food. Spoke with him about healthy food options and try not to eat a lot of fast food and watch his salt intake.    Expected Outcome Short: monitor salt in diet. Long: eat healthy well balanced meals.          Psychosocial: Target Goals: Acknowledge presence or absence of significant depression and/or stress, maximize coping skills, provide positive support system. Participant is able to verbalize types and ability to use techniques and skills needed for reducing stress and depression.  Initial Review & Psychosocial Screening:  Initial Psych Review & Screening - 11/12/23 1417       Initial Review   Current issues with Current Anxiety/Panic;History of Depression;Current Depression      Family Dynamics   Good Support System? No    Concerns No support system    Comments When ask who supports him, he states no one.      Barriers   Psychosocial barriers to participate in program The patient should benefit from training in stress management and relaxation.;There are no identifiable barriers or psychosocial needs.      Screening Interventions   Interventions To provide support and resources with identified psychosocial needs;Provide feedback about the scores to participant;Encouraged to exercise    Expected Outcomes Short Term goal: Utilizing psychosocial counselor, staff and physician to assist with identification of specific Stressors or current issues interfering with healing process. Setting desired goal for each stressor or current issue identified.;Long Term Goal: Stressors or current issues are  controlled or eliminated.;Short Term goal: Identification and review with participant of any Quality of Life or Depression concerns found by scoring the questionnaire.;Long Term goal: The participant improves quality of Life and PHQ9 Scores as seen by post scores and/or verbalization of changes          Quality of Life Scores:  Quality of Life - 11/15/23 1158       Quality of Life   Select Quality of Life      Quality of Life Scores   Health/Function Pre 15.27 %    Socioeconomic Pre 16.13 %    Psych/Spiritual Pre 12.21 %    Family Pre 15.5 %    GLOBAL Pre 14.89 %         Scores of 19 and below usually indicate a poorer quality of life in these areas.  A difference of  2-3 points is a clinically meaningful difference.  A difference of 2-3 points in the total score of the Quality of Life Index has been associated with significant improvement in overall quality of life, self-image, physical symptoms, and general health in studies assessing change in quality of life.  PHQ-9: Review Flowsheet  More data exists      11/15/2023 10/02/2021 06/26/2021 04/03/2021 01/02/2021  Depression screen PHQ 2/9  Decreased Interest 3 3 3 3  3  Down, Depressed, Hopeless 2 3 3 3 3   PHQ - 2 Score 5 6 6 6 6   Altered sleeping 0 3 3 3 3   Tired, decreased energy 0 3 3 3 3   Change in appetite 0 3 3 3 3   Feeling bad or failure about yourself  3 3 3 3 3   Trouble concentrating 0 3 3 3 3   Moving slowly or fidgety/restless 0 3 3 3 3   Suicidal thoughts 0 3 3 3 3   PHQ-9 Score 8  27  27  27  27    Difficult doing work/chores Not difficult at all - Extremely dIfficult - -    Details       Data saved with a previous flowsheet row definition        Interpretation of Total Score  Total Score Depression Severity:  1-4 = Minimal depression, 5-9 = Mild depression, 10-14 = Moderate depression, 15-19 = Moderately severe depression, 20-27 = Severe depression   Psychosocial Evaluation and Intervention:  Psychosocial  Evaluation - 11/12/23 1418       Psychosocial Evaluation & Interventions   Interventions Stress management education;Relaxation education;Encouraged to exercise with the program and follow exercise prescription    Comments Patient referred to cardiac rehab with NSTEMI/Stent placement. He has depression and anxiety listed in his medical history but says he has no more than his usual. He is currently not being treated. He lives alone and says he has no support person. 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 Olivia Pavy, PA put lifting restrictions of no  more than 10 lbs at his visit and he said he could not work with these restrictions. He is a current smoker 0.5 ppd and vapes also and is not ready to quit. His goals for the program are to be able to return to work. He says he will still be able to come to sessions after he returns to work. He has no barriers identified to complete the program.    Expected Outcomes Short Term: Patient will start the program and attend consistently. Long Term: Patient will complete the program meeting personal goals.    Continue Psychosocial Services  Follow up required by staff          Psychosocial Re-Evaluation:  Psychosocial Re-Evaluation     Row Name 12/21/23 1516 01/11/24 1529           Psychosocial Re-Evaluation   Current issues with Current Stress Concerns;Current Sleep Concerns;Current Anxiety/Panic Current Stress Concerns      Comments Ceasar is doing great in rehab! 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 He notes not having any money, he has some friends who have helped him a little, but no family support. He notes being behind on his mortgage and that he doesn't have much food left. Spoke to him about possible resources available, but he also mentions he has some things he is going to sell to get some cash to help with bills. He  notes he should be cleared for back to work soon within the next week or two. Aram has now went back to work! He feels much better, he notes things are getting back on track, but it will take awhile to get back to where I was financially.      Expected Outcomes Short: get cleared by MD to go back to work. Long: work on  having healthy stress outlets and once back to work start to catch up on bills. Short: continue to attend rehab. Long: continue to work and catch up on bills.      Interventions Encouraged to attend Cardiac Rehabilitation for the exercise Encouraged to attend Cardiac Rehabilitation for the exercise      Continue Psychosocial Services  Follow up required by staff Follow up required by staff         Psychosocial Discharge (Final Psychosocial Re-Evaluation):  Psychosocial Re-Evaluation - 01/11/24 1529       Psychosocial Re-Evaluation   Current issues with Current Stress Concerns    Comments Cohen has now went back to work! He feels much better, he notes things are getting back on track, but it will take awhile to get back to where I was financially.    Expected Outcomes Short: continue to attend rehab. Long: continue to work and catch up on bills.    Interventions Encouraged to attend Cardiac Rehabilitation for the exercise    Continue Psychosocial Services  Follow up required by staff          Vocational Rehabilitation: Provide vocational rehab assistance to qualifying candidates.   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          Education: Education Goals: Education classes will be provided on a weekly basis, covering required topics. Participant will state understanding/return demonstration of topics  presented.  Learning Barriers/Preferences:  Learning Barriers/Preferences - 11/12/23 1416       Learning Barriers/Preferences   Learning Barriers None    Learning Preferences Written Material;Audio;Skilled Demonstration          Education Topics: Hypertension, Hypertension Reduction -Define heart disease and high blood pressure. Discus how high blood pressure affects the body and ways to reduce high blood pressure.   Exercise and Your Heart -Discuss why it is important to exercise, the FITT principles of exercise, normal and abnormal responses to exercise, and how to exercise safely.   Angina -Discuss definition of angina, causes of angina, treatment of angina, and how to decrease risk of having angina.   Cardiac Medications -Review what the following cardiac medications are used for, how they affect the body, and side effects that may occur when taking the medications.  Medications include Aspirin , Beta blockers, calcium  channel blockers, ACE Inhibitors, angiotensin receptor blockers, diuretics, digoxin, and antihyperlipidemics.   Congestive Heart Failure -Discuss the definition of CHF, how to live with CHF, the signs and symptoms of CHF, and how keep track of weight and sodium intake.   Heart Disease and Intimacy -Discus the effect sexual activity has on the heart, how changes occur during intimacy as we age, and safety during sexual activity.   Smoking Cessation / COPD -Discuss different methods to quit smoking, the health benefits of quitting smoking, and the definition of COPD.   Nutrition I: Fats -Discuss the types of cholesterol, what cholesterol does to the heart, and how cholesterol levels can be controlled.   Nutrition II: Labels -Discuss the different components of food labels and how to read food label   Heart Parts/Heart Disease and PAD -Discuss the anatomy of the heart, the pathway of blood circulation through the heart, and these are affected by  heart disease.   Stress I: Signs and Symptoms -Discuss the causes of stress, how stress may lead to anxiety and depression, and ways to limit stress.   Stress II: Relaxation -Discuss different types of relaxation techniques to limit stress.   Warning Signs of Stroke / TIA -Discuss definition of a stroke, what the signs and symptoms are of a stroke, and how to identify when someone is having stroke.   Knowledge Questionnaire Score:   Core Components/Risk Factors/Patient Goals at Admission:  Personal Goals and Risk Factors at Admission - 11/12/23 1416       Core Components/Risk Factors/Patient Goals on Admission    Weight Management Obesity    Diabetes Yes    Intervention Provide education about signs/symptoms and action to take for hypo/hyperglycemia.;Provide education about proper nutrition, including hydration, and aerobic/resistive exercise prescription along with prescribed medications to achieve blood glucose in normal ranges: Fasting glucose 65-99 mg/dL    Expected Outcomes Short Term: Participant verbalizes understanding of the signs/symptoms and immediate care of hyper/hypoglycemia, proper foot care and importance of medication, aerobic/resistive exercise and nutrition plan for blood glucose control.;Long Term: Attainment of HbA1C < 7%.    Hypertension Yes    Intervention Provide education on lifestyle modifcations including regular physical activity/exercise, weight management, moderate sodium restriction and increased consumption of fresh fruit, vegetables, and low fat dairy, alcohol moderation, and smoking cessation.;Monitor prescription use compliance.    Expected Outcomes Short Term: Continued assessment and intervention until BP is < 140/31mm HG in hypertensive participants. < 130/50mm HG in hypertensive participants with diabetes, heart failure or chronic kidney disease.;Long Term: Maintenance of blood pressure at goal levels.    Lipids Yes    Intervention Provide  education and support for participant on nutrition & aerobic/resistive exercise along with prescribed medications to achieve LDL 70mg , HDL >40mg .    Expected Outcomes Short Term: Participant states understanding of desired cholesterol values and is compliant with medications prescribed. Participant is following exercise prescription and nutrition guidelines.;Long Term: Cholesterol controlled with medications as prescribed, with individualized exercise RX and with personalized nutrition plan. Value goals: LDL < 70mg , HDL > 40 mg.          Core Components/Risk Factors/Patient Goals Review:   Goals and Risk Factor Review     Row Name 12/21/23 1525 01/11/24 1535           Core Components/Risk Factors/Patient Goals Review   Personal Goals Review Hypertension;Lipids;Diabetes Hypertension;Lipids;Diabetes      Review Donevan is struggling financially. Javione notes that he got 90 day supply of all of his meds right before his money ran out after not being able to go back to work. So he notes having a taking all of his medications. He is also making sure to keep all his follow ups with his doctors so he can get back to work asap. Jhostin is better now financially, so he has all of his medications he needs. spoke with him about making sure to take medications as prescribed and checking blood pressure and blood sugars at home.      Expected Outcomes Short: Get back to work soon. Long: continue to take medications as prescribed by MD. Short: monitor blood pressure and blood sugar at home. Long: continue to take all medications as prescribed by MD.         Core Components/Risk Factors/Patient Goals at Discharge (Final Review):   Goals and Risk Factor Review - 01/11/24 1535       Core Components/Risk Factors/Patient Goals  Review   Personal Goals Review Hypertension;Lipids;Diabetes    Review Eward is better now financially, so he has all of his medications he needs. spoke with him about making sure to take  medications as prescribed and checking blood pressure and blood sugars at home.    Expected Outcomes Short: monitor blood pressure and blood sugar at home. Long: continue to take all medications as prescribed by MD.          ITP Comments:  ITP Comments     Row Name 11/12/23 1429 11/15/23 1157 11/16/23 1452 12/01/23 1816 12/29/23 0924   ITP Comments Virtual orientation visit completed for cardiac rehab with NSTEMI/Stent placement. On-site orientation visit scheduled for 11/15/23 at 10:30. Patient arrived for 1st visit/orientation/education at 1030. Patient was referred to CR by Alm Harding/Dr. Branch attending due to NSTEMI/Stent placement. During orientation advised patient on arrival and appointment times what to wear, what to do before, during and after exercise. Reviewed attendance and class policy.  Pt is scheduled to return Cardiac Rehab on 11/16/23 at 1500. Pt was advised to come to class 15 minutes before class starts.  Discussed RPE/Dpysnea scales. Patient participated in warm up stretches. Patient was able to complete 6 minute walk test.  Telemetry:NSR. Patient was measured for the equipment. Discussed equipment safety with patient. Took patient pre-anthropometric measurements. Patient finished visit at 1145. First full day of exercise!  Patient was oriented to gym and equipment including functions, settings, policies, and procedures.  Patient's individual exercise prescription and treatment plan were reviewed.  All starting workloads were established based on the results of the 6 minute walk test done at initial orientation visit.  The plan for exercise progression was also introduced and progression will be customized based on patient's performance and goals. 30 day review completed. ITP sent to Dr. Dorn Ross, Medical Director of Cardiac Rehab. Continue with ITP unless changes are made by physician.  Newer to program 30 day review completed. ITP sent to Dr. Dorn Ross, Medical  Director of Cardiac Rehab. Continue with ITP unless changes are made by physician.    Row Name 01/25/24 1132           ITP Comments 30 day review completed. ITP sent to Dr. Dorn Ross, Medical Director of Cardiac Rehab. Continue with ITP unless changes are made by physician.          Comments: 30 day review

## 2024-01-27 ENCOUNTER — Ambulatory Visit: Payer: Self-pay | Admitting: Family Medicine

## 2024-01-27 ENCOUNTER — Encounter (HOSPITAL_COMMUNITY)
Admission: RE | Admit: 2024-01-27 | Discharge: 2024-01-27 | Disposition: A | Source: Ambulatory Visit | Attending: Cardiology

## 2024-01-27 ENCOUNTER — Encounter: Payer: Self-pay | Admitting: Family Medicine

## 2024-01-27 VITALS — BP 129/89 | HR 66 | Ht 70.0 in | Wt 249.0 lb

## 2024-01-27 DIAGNOSIS — I214 Non-ST elevation (NSTEMI) myocardial infarction: Secondary | ICD-10-CM

## 2024-01-27 DIAGNOSIS — E1169 Type 2 diabetes mellitus with other specified complication: Secondary | ICD-10-CM

## 2024-01-27 DIAGNOSIS — Z955 Presence of coronary angioplasty implant and graft: Secondary | ICD-10-CM

## 2024-01-27 DIAGNOSIS — Z5987 Material hardship due to limited financial resources, not elsewhere classified: Secondary | ICD-10-CM | POA: Diagnosis not present

## 2024-01-27 DIAGNOSIS — F5104 Psychophysiologic insomnia: Secondary | ICD-10-CM

## 2024-01-27 DIAGNOSIS — E1159 Type 2 diabetes mellitus with other circulatory complications: Secondary | ICD-10-CM | POA: Diagnosis not present

## 2024-01-27 DIAGNOSIS — I152 Hypertension secondary to endocrine disorders: Secondary | ICD-10-CM

## 2024-01-27 LAB — BAYER DCA HB A1C WAIVED: HB A1C (BAYER DCA - WAIVED): 5.2 % (ref 4.8–5.6)

## 2024-01-27 MED ORDER — EMPAGLIFLOZIN 10 MG PO TABS: 10.0000 mg | ORAL_TABLET | Freq: Every day | ORAL | 3 refills | Status: AC

## 2024-01-27 MED ORDER — LISINOPRIL 5 MG PO TABS: 5.0000 mg | ORAL_TABLET | Freq: Every day | ORAL | 3 refills | Status: AC

## 2024-01-27 MED ORDER — TIRZEPATIDE 2.5 MG/0.5ML ~~LOC~~ SOAJ: 2.5000 mg | SUBCUTANEOUS | Status: DC

## 2024-01-27 MED ORDER — TRAZODONE HCL 50 MG PO TABS: 25.0000 mg | ORAL_TABLET | Freq: Every evening | ORAL | 3 refills | Status: AC | PRN

## 2024-01-27 MED ORDER — EMPAGLIFLOZIN 10 MG PO TABS: 10.0000 mg | ORAL_TABLET | Freq: Every day | ORAL | 0 refills | Status: DC

## 2024-01-27 NOTE — Progress Notes (Signed)
 BP 129/89   Pulse 66   Ht 5' 10 (1.778 m)   Wt 249 lb (112.9 kg)   SpO2 95%   BMI 35.73 kg/m    Subjective:   Patient ID: Carl [name], male    DOB: [birth year], 48 y.o.   MRN: [id number]  HPI: Carl [name] is a 48 y.o. male presenting on 01/27/2024 for Medical Management of Chronic Issues, Diabetes, and Hypertension   Discussed the use of AI scribe software for clinical note transcription with the patient, who gave verbal consent to proceed.  History of Present Illness   Carl [name] is a 48 year old male with coronary artery disease who presents for a recheck following a heart event in September.  Coronary artery disease and cardiac event - Experienced a heart event in September requiring placement of an additional stent near two previous stents. - Cardiology is monitoring his condition. - No changes in smoking habits; continues to smoke, stating it is one of his few pleasures. - Concerns regarding dietary habits and stress levels.  Glycemic control - Currently taking Mounjaro  and Jardiance . - Blood sugar levels are well controlled; recent A1c is 5.2, improved from previous values of 5.4-5.5. - Despite episodes of binge eating sugar, blood glucose remains stable.  Sleep disturbance - Difficulty initiating sleep due to stress, often not falling asleep until 8 PM after going to bed at 3 PM. - Required to wake at 10 PM for work. - Sleep is fragmented, with two to three hours at a time. - Sleeps excessively on days off to compensate for poor sleep during workdays. - Uses melatonin, up to 80 mg, without significant improvement in sleep quality.  Psychological stress and depression - Significant stress and depressive symptoms, exacerbated by financial difficulties and inability to access grief counseling. - No pursuit of further mental health treatment. - Acknowledges impact of psychological symptoms on sleep and overall well-being.  Financial hardship - Experienced  financial strain due to two months out of work without short-term disability benefits. - Difficulty paying bills and maintaining mortgage; currently three months behind on payments. - Sold personal items to manage expenses.          Relevant past medical, surgical, family and social history reviewed and updated as indicated. Interim medical history since our last visit reviewed. Allergies and medications reviewed and updated.  Review of Systems  Constitutional:  Negative for chills and fever.  Eyes:  Negative for visual disturbance.  Respiratory:  Negative for shortness of breath and wheezing.   Cardiovascular:  Negative for chest pain and leg swelling.  Musculoskeletal:  Negative for back pain and gait problem.  Skin:  Negative for rash.  Psychiatric/Behavioral:  Positive for dysphoric mood and sleep disturbance. The patient is nervous/anxious.   All other systems reviewed and are negative.   Per HPI unless specifically indicated above   Allergies as of 01/27/2024   No Known Allergies      Medication List        Accurate as of January 27, 2024 10:54 AM. If you have any questions, ask your nurse or doctor.          amLODipine  2.5 MG tablet Commonly known as: NORVASC  TAKE 1 TABLET BY MOUTH DAILY   aspirin  EC 81 MG tablet Take 81 mg by mouth daily.   atorvastatin  80 MG tablet Commonly known as: LIPITOR  TAKE 1 TABLET BY MOUTH DAILY   clopidogrel  75 MG tablet Commonly known as: PLAVIX  Take 1  tablet (75 mg total) by mouth daily with breakfast.   empagliflozin  10 MG Tabs tablet Commonly known as: Jardiance  Take 1 tablet (10 mg total) by mouth daily before breakfast.   lisinopril  5 MG tablet Commonly known as: ZESTRIL  Take 1 tablet (5 mg total) by mouth daily.   metoprolol  succinate 50 MG 24 hr tablet Commonly known as: TOPROL -XL TAKE 1 TABLET BY MOUTH EVERY DAY with OR immediately AFTER a meal   nitroGLYCERIN  0.4 MG SL tablet Commonly known as:  NITROSTAT  Place 1 tablet (0.4 mg total) under the tongue every 5 (five) minutes x 3 doses as needed for chest pain (if no relief after 2nd dose, proceed to ED or call 911).   pantoprazole  40 MG tablet Commonly known as: Protonix  Take 1 tablet (40 mg total) by mouth daily.   tirzepatide  7.5 MG/0.5ML Pen Commonly known as: MOUNJARO  Inject 7.5 mg into the skin once a week.   traZODone 50 MG tablet Commonly known as: DESYREL Take 0.5-1 tablets (25-50 mg total) by mouth at bedtime as needed for sleep. Started by: [employer] [employer] Kensey Luepke   vitamin C 1000 MG tablet Take 2,000 mg by mouth daily.         Objective:   BP 129/89   Pulse 66   Ht 5' 10 (1.778 m)   Wt 249 lb (112.9 kg)   SpO2 95%   BMI 35.73 kg/m   Wt Readings from Last 3 Encounters:  01/27/24 249 lb (112.9 kg)  12/29/23 251 lb (113.9 kg)  11/15/23 249 lb 9 oz (113.2 kg)    Physical Exam Vitals and nursing note reviewed.  Constitutional:      Appearance: Normal appearance. He is obese.  Neurological:     Mental Status: He is alert.  Psychiatric:        Mood and Affect: Mood is anxious and depressed.        Thought Content: Thought content does not include suicidal ideation. Thought content does not include suicidal plan.    Physical Exam   NECK: Thyroid  normal. CHEST: Lungs clear to auscultation bilaterally. CARDIOVASCULAR: Regular rate and rhythm, no murmurs.         Assessment & Plan:   Problem List Items Addressed This Visit       Cardiovascular and Mediastinum   Hypertension associated with diabetes (HCC) (Chronic)   Relevant Medications   empagliflozin  (JARDIANCE ) 10 MG TABS tablet   lisinopril  (ZESTRIL ) 5 MG tablet     Endocrine   Hyperlipidemia associated with type 2 diabetes mellitus (HCC) (Chronic)   Relevant Medications   empagliflozin  (JARDIANCE ) 10 MG TABS tablet   lisinopril  (ZESTRIL ) 5 MG tablet   Type 2 diabetes mellitus with other specified complication (HCC) - Primary (Chronic)    Relevant Medications   empagliflozin  (JARDIANCE ) 10 MG TABS tablet   lisinopril  (ZESTRIL ) 5 MG tablet   Other Relevant Orders   Bayer DCA Hb A1c Waived   Other Visit Diagnoses       Psychophysiological insomnia         Material hardship due to limited financial resources              Coronary artery disease, status post stent placement Recent stent placement with ongoing issues related to diet, smoking, and stress. Cardiologist expressed dissatisfaction with these factors. - Continue Mounjaro  and Jardiance  for cardiovascular benefits. - Encouraged dietary improvements and smoking cessation.  Type 2 diabetes mellitus Well-controlled with current medications. Recent A1c is 5.2, improved from previous 5.4-5.5.  Blood sugar levels stable despite binge sugar eating. - Continue current diabetes medications (Mounjaro  and Jardiance ). - Encouraged reduction in sugar intake.  Insomnia Difficulty sleeping due to stress and irregular schedule. Melatonin ineffective. Trazodone discussed as potential treatment. - Prescribed trazodone for sleep, to be taken as needed, especially on nights with more than four hours of sleep available. - Advised taking trazodone 30 minutes to an hour before desired sleep time. - Encouraged establishing a consistent sleep schedule.  Depression and anxiety symptoms Symptoms likely contributing to insomnia. Trazodone may provide mild antidepressant effects. Discussed potential for daily antidepressant therapy if needed, considering DOT regulations. - Prescribed trazodone, which may help with mild depression and anxiety symptoms. - Will consider daily antidepressant therapy if symptoms persist and DOT regulations allow.          Follow up plan: Return in about 3 months (around 04/26/2024), or if symptoms worsen or fail to improve, for Diabetes.  Counseling provided for all of the vaccine components Orders Placed This Encounter  Procedures   Bayer DCA Hb A1c  Waived    Fonda Levins, MD [facility] 01/27/2024, 10:54 AM

## 2024-01-27 NOTE — Addendum Note (Signed)
 Addended by: LEIGH ROSINA SAILOR on: 01/27/2024 11:24 AM   Modules accepted: Orders

## 2024-01-27 NOTE — Progress Notes (Signed)
 Daily Session Note  Patient Details  Name: Carl [name] MRN: [id number] Date of Birth: [birth year] Referring Provider:   Flowsheet Row CARDIAC REHAB PHASE II ORIENTATION from 11/15/2023 in [facility] CARDIAC REHABILITATION  Referring Provider Anner Lenis MD  Suzzette Carrier MD sending everything]    Encounter Date: 01/27/2024  Check In:  Session Check In - 01/27/24 1439       Check-In   Supervising physician immediately available to respond to emergencies See telemetry face sheet for immediately available MD    Location [facility]    Staff Present Harlene Gelineau, MA, RCEP, CCRP, CCET;Heather Con, BS, Exercise Physiologist;Debra Vicci, RN, BSN;Sephiroth Mcluckie BSN, RN    Virtual Visit No    Medication changes reported     Yes    Comments Pt just prescribed Trazodone today (01/27/24) for help with insomnia/depression    Fall or balance concerns reported    No    Tobacco Cessation No Change    Warm-up and Cool-down Performed on first and last piece of equipment    Resistance Training Performed Yes    VAD Patient? No    PAD/SET Patient? No      Pain Assessment   Currently in Pain? No/denies    Pain Score 0-No pain    Multiple Pain Sites No          Capillary Blood Glucose: No results found for this or any previous visit (from the past 24 hours).    Social History   Tobacco Use  Smoking Status Every Day   Current packs/day: 1.00   Average packs/day: 1 pack/day for 31.1 years (31.1 ttl pk-yrs)   Types: Cigarettes   Start date: 01/03/1993  Smokeless Tobacco Never    Goals Met:  Independence with exercise equipment Exercise tolerated well No report of concerns or symptoms today Strength training completed today  Goals Unmet:  Not Applicable  Comments: SABRASABRAPt able to follow exercise prescription today without complaint.  Will continue to monitor for progression.

## 2024-02-01 ENCOUNTER — Encounter (HOSPITAL_COMMUNITY): Admission: RE | Admit: 2024-02-01 | Source: Ambulatory Visit

## 2024-02-03 ENCOUNTER — Encounter (HOSPITAL_COMMUNITY)

## 2024-02-08 ENCOUNTER — Encounter (HOSPITAL_COMMUNITY): Admission: RE | Admit: 2024-02-08 | Discharge: 2024-02-08 | Attending: Cardiology

## 2024-02-08 DIAGNOSIS — Z955 Presence of coronary angioplasty implant and graft: Secondary | ICD-10-CM

## 2024-02-08 DIAGNOSIS — I214 Non-ST elevation (NSTEMI) myocardial infarction: Secondary | ICD-10-CM

## 2024-02-08 NOTE — Progress Notes (Signed)
 Daily Session Note  Patient Details  Name: Carl [name] MRN: [id number] Date of Birth: June 21, 1975 Referring Provider:   Flowsheet Row CARDIAC REHAB PHASE II ORIENTATION from 11/15/2023 in [facility] CARDIAC REHABILITATION  Referring Provider Anner Lenis MD  Suzzette Carrier MD sending everything]    Encounter Date: 02/08/2024  Check In:  Session Check In - 02/08/24 1439       Check-In   Supervising physician immediately available to respond to emergencies See telemetry face sheet for immediately available MD    Location [facility]    Staff Present Powell Benders, BS, Exercise Physiologist;Romero Letizia Zina, RN    Virtual Visit No    Medication changes reported     Yes    Comments Trazodone  50 mg QD added    Fall or balance concerns reported    No    Warm-up and Cool-down Performed on first and last piece of equipment    Resistance Training Performed Yes    VAD Patient? No    PAD/SET Patient? No      Pain Assessment   Currently in Pain? No/denies          Capillary Blood Glucose: No results found for this or any previous visit (from the past 24 hours).    Tobacco Use History[1]  Goals Met:  Independence with exercise equipment Exercise tolerated well No report of concerns or symptoms today Strength training completed today  Goals Unmet:  Not Applicable  Comments: Pt able to follow exercise prescription today without complaint.  Will continue to monitor for progression.        [1]  Social History Tobacco Use  Smoking Status Every Day   Current packs/day: 1.00   Average packs/day: 1 pack/day for 31.1 years (31.1 ttl pk-yrs)   Types: Cigarettes   Start date: 01/03/1993  Smokeless Tobacco Never

## 2024-02-10 ENCOUNTER — Encounter (HOSPITAL_COMMUNITY): Admission: RE | Admit: 2024-02-10 | Discharge: 2024-02-10 | Attending: Cardiology

## 2024-02-10 DIAGNOSIS — I214 Non-ST elevation (NSTEMI) myocardial infarction: Secondary | ICD-10-CM

## 2024-02-10 DIAGNOSIS — Z955 Presence of coronary angioplasty implant and graft: Secondary | ICD-10-CM

## 2024-02-10 NOTE — Progress Notes (Signed)
 Daily Session Note  Patient Details  Name: Carl [name] MRN: [id number] Date of Birth: [birth year] Referring Provider:   Flowsheet Row CARDIAC REHAB PHASE II ORIENTATION from 11/15/2023 in [facility] CARDIAC REHABILITATION  Referring Provider Anner Lenis MD  Suzzette Carrier MD sending everything]    Encounter Date: 02/10/2024  Check In:  Session Check In - 02/10/24 1445       Check-In   Supervising physician immediately available to respond to emergencies See telemetry face sheet for immediately available MD    Staff Present Powell Benders, BS, Exercise Physiologist;Hillary Dean BSN, RN    Virtual Visit No    Medication changes reported     No    Fall or balance concerns reported    No    Tobacco Cessation No Change    Warm-up and Cool-down Performed on first and last piece of equipment    Resistance Training Performed Yes    VAD Patient? No    PAD/SET Patient? No      Pain Assessment   Currently in Pain? No/denies    Pain Score 0-No pain    Multiple Pain Sites No          Capillary Blood Glucose: No results found for this or any previous visit (from the past 24 hours).    Tobacco Use History[1]  Goals Met:  Independence with exercise equipment Exercise tolerated well No report of concerns or symptoms today Strength training completed today  Goals Unmet:  Not Applicable  Comments: Pt able to follow exercise prescription today without complaint.  Will continue to monitor for progression.        [1]  Social History Tobacco Use  Smoking Status Every Day   Current packs/day: 1.00   Average packs/day: 1 pack/day for 31.1 years (31.1 ttl pk-yrs)   Types: Cigarettes   Start date: 01/03/1993  Smokeless Tobacco Never

## 2024-02-15 ENCOUNTER — Encounter (HOSPITAL_COMMUNITY)
Admission: RE | Admit: 2024-02-15 | Discharge: 2024-02-15 | Disposition: A | Discharge status: Home / Self Care | Source: Ambulatory Visit | Attending: Cardiology | Admitting: Cardiology

## 2024-02-15 DIAGNOSIS — I214 Non-ST elevation (NSTEMI) myocardial infarction: Secondary | ICD-10-CM

## 2024-02-15 DIAGNOSIS — Z955 Presence of coronary angioplasty implant and graft: Secondary | ICD-10-CM

## 2024-02-15 NOTE — Progress Notes (Signed)
 Daily Session Note  Patient Details  Name: Carl [name] MRN: [id number] Date of Birth: 1975/12/08 Referring Provider:   Flowsheet Row CARDIAC REHAB PHASE II ORIENTATION from 11/15/2023 in [facility] CARDIAC REHABILITATION  Referring Provider Anner Lenis MD  Suzzette Carrier MD sending everything]    Encounter Date: 02/15/2024  Check In:  Session Check In - 02/15/24 1505       Check-In   Supervising physician immediately available to respond to emergencies See telemetry face sheet for immediately available MD    Location [facility]    Staff Present Powell Benders, BS, Exercise Physiologist;Jessica Vonzell, MA, RCEP, CCRP, CCET;Victoria Hennessey, RN    Virtual Visit No    Medication changes reported     No    Fall or balance concerns reported    No    Tobacco Cessation No Change    Warm-up and Cool-down Performed on first and last piece of equipment    Resistance Training Performed Yes    VAD Patient? No    PAD/SET Patient? No      Pain Assessment   Currently in Pain? No/denies    Pain Score 0-No pain    Multiple Pain Sites No          Capillary Blood Glucose: No results found for this or any previous visit (from the past 24 hours).    Tobacco Use History[1]  Goals Met:  Independence with exercise equipment Exercise tolerated well No report of concerns or symptoms today Strength training completed today  Goals Unmet:  Not Applicable  Comments: Pt able to follow exercise prescription today without complaint.  Will continue to monitor for progression.        [1]  Social History Tobacco Use  Smoking Status Every Day   Current packs/day: 1.00   Average packs/day: 1 pack/day for 31.1 years (31.1 ttl pk-yrs)   Types: Cigarettes   Start date: 01/03/1993  Smokeless Tobacco Never

## 2024-02-18 ENCOUNTER — Ambulatory Visit: Admitting: Family

## 2024-02-18 ENCOUNTER — Ambulatory Visit

## 2024-02-18 ENCOUNTER — Encounter: Payer: Self-pay | Admitting: Family

## 2024-02-18 VITALS — BP 117/72 | HR 74 | Temp 97.6°F | Ht 70.0 in | Wt 247.4 lb

## 2024-02-18 DIAGNOSIS — F439 Reaction to severe stress, unspecified: Secondary | ICD-10-CM | POA: Diagnosis not present

## 2024-02-18 DIAGNOSIS — Z7985 Long-term (current) use of injectable non-insulin antidiabetic drugs: Secondary | ICD-10-CM

## 2024-02-18 DIAGNOSIS — E1169 Type 2 diabetes mellitus with other specified complication: Secondary | ICD-10-CM | POA: Diagnosis not present

## 2024-02-18 DIAGNOSIS — R197 Diarrhea, unspecified: Secondary | ICD-10-CM

## 2024-02-18 NOTE — Progress Notes (Signed)
 "  Subjective:    Patient ID: Carl [name], male    DOB: 02-01-1976, 48 y.o.   MRN: [id number]  Chief Complaint  Patient presents with   Diarrhea   Pt presents to the office today with diarrhea that started two weeks ago that comes and goes. Becoming more frequent.   He took imodium that slightly helped.   He is currently taking Mounjaro  5 mg  and 7.5 mg every other week for the last  few weeks for DM. This is controlled with his last A1C 5.2.   Does report increase stress over the last few months.  Diarrhea  This is a new problem. The current episode started 1 to 4 weeks ago. The problem occurs 5 to 10 times per day. The problem has been gradually worsening. The stool consistency is described as Watery. Associated symptoms include chills and headaches (One day). Pertinent negatives include no bloating, coughing, fever, increased  flatus, myalgias or vomiting. Associated symptoms comments: Nausea . Nothing aggravates the symptoms. He has tried anti-motility drug for the symptoms. The treatment provided mild relief.      Review of Systems  Constitutional:  Positive for chills. Negative for fever.  Respiratory:  Negative for cough.   Gastrointestinal:  Positive for diarrhea. Negative for bloating, flatus and vomiting.  Musculoskeletal:  Negative for myalgias.  Neurological:  Positive for headaches (One day).  All other systems reviewed and are negative.   Social History   Socioeconomic History   Marital status: Divorced    Spouse name: Not on file   Number of children: Not on file   Years of education: Not on file   Highest education level: Not on file  Occupational History   Not on file  Tobacco Use   Smoking status: Every Day    Current packs/day: 1.00    Average packs/day: 1 pack/day for 31.1 years (31.1 ttl pk-yrs)    Types: Cigarettes    Start date: 01/03/1993   Smokeless tobacco: Never  Vaping Use   Vaping status: Every Day   Substances: Nicotine , Flavoring   Substance and Sexual Activity   Alcohol use: Not Currently    Comment: 08/25/2017 0-a few drinks/year   Drug use: Never   Sexual activity: Yes  Other Topics Concern   Not on file  Social History Narrative   Not on file   Social Drivers of Health   Tobacco Use: High Risk (01/27/2024)   Patient History    Smoking Tobacco Use: Every Day    Smokeless Tobacco Use: Never    Passive Exposure: Not on file  Financial Resource Strain: Not on file  Food Insecurity: No Food Insecurity (10/27/2023)   Epic    Worried About Programme Researcher, Broadcasting/film/video in the Last Year: Never true    Ran Out of Food in the Last Year: Never true  Transportation Needs: No Transportation Needs (10/27/2023)   Epic    Lack of Transportation (Medical): No    Lack of Transportation (Non-Medical): No  Physical Activity: Not on file  Stress: Not on file  Social Connections: Unknown (06/29/2021)   Received from [facility]   Social Network    Social Network: Not on file  Depression (PHQ2-9): Medium Risk (11/15/2023)   Depression (PHQ2-9)    PHQ-2 Score: 8  Alcohol Screen: Not on file  Housing: Low Risk (10/27/2023)   Epic    Unable to Pay for Housing in the Last Year: No    Number of Times Moved  in the Last Year: 0    Homeless in the Last Year: No  Utilities: Not At Risk (10/27/2023)   Epic    Threatened with loss of utilities: No  Health Literacy: Not on file   Family History  Problem Relation Age of Onset   Diabetes Mother    Cancer Mother    Diabetes Father         Objective:   Physical Exam Vitals reviewed.  Constitutional:      General: He is not in acute distress.    Appearance: He is well-developed.  HENT:     Head: Normocephalic.  Eyes:     General:        Right eye: No discharge.        Left eye: No discharge.     Pupils: Pupils are equal, round, and reactive to light.  Neck:     Thyroid : No thyromegaly.  Cardiovascular:     Rate and Rhythm: Normal rate and regular rhythm.     Heart sounds:  Normal heart sounds. No murmur heard. Pulmonary:     Effort: Pulmonary effort is normal. No respiratory distress.     Breath sounds: Normal breath sounds. No wheezing.  Abdominal:     General: Bowel sounds are normal. There is no distension.     Palpations: Abdomen is soft.     Tenderness: There is no abdominal tenderness.  Musculoskeletal:        General: No tenderness. Normal range of motion.     Cervical back: Normal range of motion and neck supple.  Skin:    General: Skin is warm and dry.     Findings: No erythema or rash.  Neurological:     Mental Status: He is alert and oriented to person, place, and time.     Cranial Nerves: No cranial nerve deficit.     Deep Tendon Reflexes: Reflexes are normal and symmetric.  Psychiatric:        Behavior: Behavior normal.        Thought Content: Thought content normal.        Judgment: Judgment normal.       BP 117/72   Pulse 74   Temp 97.6 F (36.4 C) (Temporal)   Ht 5' 10 (1.778 m)   Wt 247 lb 6.4 oz (112.2 kg)   SpO2 96%   BMI 35.50 kg/m      Assessment & Plan:  JAKORI BURKETT comes in today with chief complaint of Diarrhea   Diagnosis and orders addressed:  1. Diarrhea, unspecified type (Primary) - Clostridium difficile EIA - CBC with Differential/Platelet - BMP8+EGFR  2. Stress Stress management Encouraged to make follow up with PCP to discuss medication managment  3. Type 2 diabetes mellitus with other specified complication, without long-term current use of insulin  (HCC) Stable Recommend moving up Mounjar to 7.5 mg and staying    Labs pending Force fluids  BRAT diet C Diff pending  I believe this is related to the Mounjaro  given him rotating doses. After discussion, we realized his diarrhea has been worse on the weeks he took the 7.5 mg for the first few days.  Follow up with PCP   Bari Learn, FNP   "

## 2024-02-18 NOTE — Patient Instructions (Signed)
 Diarrhea, Adult Diarrhea is frequent loose and sometimes watery bowel movements. Diarrhea can make you feel weak and cause you to become dehydrated. Dehydration is a condition in which there is not enough water or other fluids in the body. Dehydration can make you tired and thirsty, cause you to have a dry mouth, and decrease how often you urinate. Diarrhea typically lasts 2-3 days. However, it can last longer if it is a sign of something more serious. It is important to treat your diarrhea as told by your health care provider. Follow these instructions at home: Eating and drinking     Follow these recommendations as told by your health care provider: Take an oral rehydration solution (ORS). This is an over-the-counter medicine that helps return your body to its normal balance of nutrients and water. It is found at pharmacies and retail stores. Drink enough fluid to keep your urine pale yellow. Drink fluids such as water, diluted fruit juice, and low-calorie sports drinks. You can drink milk also, if desired. Sucking on ice chips is another way to get fluids. Avoid drinking fluids that contain a lot of sugar or caffeine, such as soda, energy drinks, and regular sports drinks. Avoid alcohol. Eat bland, easy-to-digest foods in small amounts as you are able. These foods include bananas, applesauce, rice, lean meats, toast, and crackers. Avoid spicy or fatty foods.  Medicines Take over-the-counter and prescription medicines only as told by your health care provider. If you were prescribed antibiotics, take them as told by your health care provider. Do not stop using the antibiotic even if you start to feel better. General instructions  Wash your hands often using soap and water for at least 20 seconds. If soap and water are not available, use hand sanitizer. Others in the household should wash their hands as well. Hands should be washed: After using the toilet or changing a diaper. Before  preparing, cooking, or serving food. While caring for a sick person or while visiting someone in a hospital. Rest at home while you recover. Take a warm bath to relieve any burning or pain from frequent diarrhea episodes. Watch your condition for any changes. Contact a health care provider if: You have a fever. Your diarrhea gets worse. You have new symptoms. You vomit every time you eat or drink. You feel light-headed, dizzy, or have a headache. You have muscle cramps. You have signs of dehydration, such as: Dark urine, very little urine, or no urine. Cracked lips. Dry mouth. Sunken eyes. Sleepiness. Weakness. You have bloody or black stools or stools that look like tar. You have severe pain, cramping, or bloating in your abdomen. Your skin feels cold and clammy. You feel confused. Get help right away if: You have chest pain or your heart is beating very quickly. You have trouble breathing or you are breathing very quickly. You feel extremely weak or you faint. These symptoms may be an emergency. Get help right away. Call 911. Do not wait to see if the symptoms will go away. Do not drive yourself to the hospital. This information is not intended to replace advice given to you by your health care provider. Make sure you discuss any questions you have with your health care provider. Document Revised: 07/29/2021 Document Reviewed: 07/29/2021 Elsevier Patient Education  2024 [employer].

## 2024-02-19 LAB — CBC WITH DIFFERENTIAL/PLATELET
Basophils Absolute: 0 x10E3/uL (ref 0.0–0.2)
Basos: 1 %
EOS (ABSOLUTE): 0.2 x10E3/uL (ref 0.0–0.4)
Eos: 2 %
Hematocrit: 44.8 % (ref 37.5–51.0)
Hemoglobin: 15.2 g/dL (ref 13.0–17.7)
Immature Grans (Abs): 0 x10E3/uL (ref 0.0–0.1)
Immature Granulocytes: 0 %
Lymphocytes Absolute: 2.8 x10E3/uL (ref 0.7–3.1)
Lymphs: 36 %
MCH: 32.8 pg (ref 26.6–33.0)
MCHC: 33.9 g/dL (ref 31.5–35.7)
MCV: 97 fL (ref 79–97)
Monocytes Absolute: 1.1 x10E3/uL — ABNORMAL HIGH (ref 0.1–0.9)
Monocytes: 14 %
Neutrophils Absolute: 3.6 x10E3/uL (ref 1.4–7.0)
Neutrophils: 47 %
Platelets: 187 x10E3/uL (ref 150–450)
RBC: 4.63 x10E6/uL (ref 4.14–5.80)
RDW: 12.3 % (ref 11.6–15.4)
WBC: 7.7 x10E3/uL (ref 3.4–10.8)

## 2024-02-19 LAB — BMP8+EGFR
BUN/Creatinine Ratio: 16 (ref 9–20)
BUN: 16 mg/dL (ref 6–24)
CO2: 23 mmol/L (ref 20–29)
Calcium: 9.2 mg/dL (ref 8.7–10.2)
Chloride: 103 mmol/L (ref 96–106)
Creatinine, Ser: 1.01 mg/dL (ref 0.76–1.27)
Glucose: 89 mg/dL (ref 70–99)
Potassium: 4.5 mmol/L (ref 3.5–5.2)
Sodium: 141 mmol/L (ref 134–144)
eGFR: 91 mL/min/1.73

## 2024-02-21 ENCOUNTER — Ambulatory Visit: Payer: Self-pay | Admitting: Family

## 2024-02-22 ENCOUNTER — Encounter (HOSPITAL_COMMUNITY)
Admission: RE | Admit: 2024-02-22 | Discharge: 2024-02-22 | Disposition: A | Discharge status: Home / Self Care | Source: Ambulatory Visit | Attending: Cardiology | Admitting: Cardiology

## 2024-02-22 ENCOUNTER — Encounter (HOSPITAL_COMMUNITY): Payer: Self-pay | Admitting: *Deleted

## 2024-02-22 DIAGNOSIS — Z955 Presence of coronary angioplasty implant and graft: Secondary | ICD-10-CM

## 2024-02-22 DIAGNOSIS — I214 Non-ST elevation (NSTEMI) myocardial infarction: Secondary | ICD-10-CM

## 2024-02-22 NOTE — Progress Notes (Signed)
 Cardiac Individual Treatment Plan  Patient Details  Name: Carl [name] MRN: [id number] Date of Birth: 1975/04/08 Referring Provider:   Flowsheet Row CARDIAC REHAB PHASE II ORIENTATION from 11/15/2023 in [facility] CARDIAC REHABILITATION  Referring Provider Anner Lenis MD  Suzzette Carrier MD sending everything]    Initial Encounter Date:  Flowsheet Row CARDIAC REHAB PHASE II ORIENTATION from 11/15/2023 in [facility] CARDIAC REHABILITATION  Date 11/15/23    Visit Diagnosis: NSTEMI (non-ST elevated myocardial infarction) [facility])  Status post coronary artery stent placement  Patient's Home Medications on Admission: Current Medications[1]  Past Medical History: Past Medical History:  Diagnosis Date   Anxiety    CAD in native artery    a. cath in 2015 [facility] showing occluded proximal LAD, LAD filled with left-left and right-left collaterals, otherwise 20-30% prox-mid RCA, LVEF 55%. b. Abnormal nuc 02/2016 - [facility] medically. c. redo cath in 06/2017 showing 100% Prox LAD stenosis, 50% RCA, and 40% RPDA and underwent CTO PCI of the LAD with DESx2 to in 08/2017   Daily headache    since I started Plavix  (08/25/2017)   Depression    Diabetes mellitus type 2 in obese    Hyperlipidemia    Hypertension    OSA on CPAP     Tobacco Use: Tobacco Use History[2]  Labs: Review Flowsheet  More data exists      Latest Ref Rng & Units 12/31/2022 04/08/2023 07/08/2023 10/14/2023 01/27/2024  Labs for ITP Cardiac and Pulmonary Rehab  Cholestrol 100 - 199 mg/dL 891  899  - 897  -  LDL (calc) 0 - 99 mg/dL 53  48  - 51  -  HDL-C >39 mg/dL 34  31  - 34  -  Trlycerides 0 - 149 mg/dL 883  886  - 87  -  Hemoglobin A1c 4.8 - 5.6 % 5.3  5.5  5.5  5.5  5.2     Capillary Blood Glucose: Lab Results  Component Value Date   GLUCAP 100 (H) 10/28/2023   GLUCAP 93 10/27/2023   GLUCAP 79 10/27/2023   GLUCAP 83 10/27/2023   GLUCAP 92 06/02/2023     Exercise Target Goals: Exercise Program  Goal: Individual exercise prescription set using results from initial 6 min walk test and THRR while considering  patients activity barriers and safety.   Exercise Prescription Goal: Starting with aerobic activity 30 plus minutes a day, 3 days per week for initial exercise prescription. Provide home exercise prescription and guidelines that participant acknowledges understanding prior to discharge.  Activity Barriers & Risk Stratification:  Activity Barriers & Cardiac Risk Stratification - 11/12/23 1408       Activity Barriers & Cardiac Risk Stratification   Activity Barriers None    Cardiac Risk Stratification High          6 Minute Walk:  6 Minute Walk     Row Name 11/15/23 1147         6 Minute Walk   Phase Initial     Distance 1150 feet     Walk Time 6 minutes     # of Rest Breaks 0     MPH 2.18     METS 3.47     RPE 8     VO2 Peak 12.16     Symptoms No     Resting HR 97 bpm     Resting BP 110/80     Resting Oxygen Saturation  96 %     Exercise Oxygen Saturation  during 6 min walk 96 %     Max Ex. HR 111 bpm     Max Ex. BP 130/78     2 Minute Post BP 120/78        Oxygen Initial Assessment:   Oxygen Re-Evaluation:   Oxygen Discharge (Final Oxygen Re-Evaluation):   Initial Exercise Prescription:  Initial Exercise Prescription - 11/15/23 1100       Date of Initial Exercise RX and Referring Provider   Date 11/15/23    Referring Provider Anner Lenis MD   Alvan Carrier MD sending everything     Treadmill   MPH 1.8    Grade 0.5    Minutes 15    METs 2.5      NuStep   Level 5    SPM 50    Minutes 15    METs 2      Prescription Details   Frequency (times per week) 2    Duration Progress to 30 minutes of continuous aerobic without signs/symptoms of physical distress      Intensity   THRR 40-80% of Max Heartrate 126-156    Ratings of Perceived Exertion 11-13    Perceived Dyspnea 0-4      Resistance Training   Training Prescription  Yes    Weight 5    Reps 10-15          Perform Capillary Blood Glucose checks as needed.  Exercise Prescription Changes:   Exercise Prescription Changes     Row Name 11/15/23 1100 11/18/23 1500 12/21/23 1500 01/13/24 1500 01/27/24 1500     Response to Exercise   Blood Pressure (Admit) 110/80 118/88 116/70 134/70 122/66   Blood Pressure (Exercise) 130/78 140/80 -- -- --   Blood Pressure (Exit) 120/78 140/100 124/80 126/68 124/70   Heart Rate (Admit) 97 bpm 65 bpm 78 bpm 563 bpm 73 bpm   Heart Rate (Exercise) 111 bpm 139 bpm 162 bpm 138 bpm 150 bpm   Heart Rate (Exit) 85 bpm 74 bpm 128 bpm 112 bpm 120 bpm   Oxygen Saturation (Admit) 96 % -- -- -- --   Oxygen Saturation (Exercise) 96 % -- -- -- --   Oxygen Saturation (Exit) 96 % -- -- -- --   Rating of Perceived Exertion (Exercise) 8 9 13 14 13    Duration -- Continue with 30 min of aerobic exercise without signs/symptoms of physical distress. Continue with 30 min of aerobic exercise without signs/symptoms of physical distress. Continue with 30 min of aerobic exercise without signs/symptoms of physical distress. Continue with 30 min of aerobic exercise without signs/symptoms of physical distress.   Intensity -- THRR unchanged THRR unchanged THRR unchanged THRR unchanged     Progression   Progression -- Continue to progress workloads to maintain intensity without signs/symptoms of physical distress. Continue to progress workloads to maintain intensity without signs/symptoms of physical distress. Continue to progress workloads to maintain intensity without signs/symptoms of physical distress. Continue to progress workloads to maintain intensity without signs/symptoms of physical distress.     Resistance Training   Training Prescription -- Yes Yes Yes Yes   Weight -- 5 5 5 10    Reps -- 10-15 10-15 10-15 10-15     Treadmill   MPH -- 2.5 3 2.8 2.8   Grade -- 1 4 8  0   Minutes -- 15 1 15 15    METs -- 3.26 4.95 6.23 3.14     NuStep    Level -- 8 8 8  10  SPM -- 81 98 94 101   Minutes -- 15 15 15 15    METs -- 4.4 6.2 5 5.8      Exercise Comments:   Exercise Comments     Row Name 11/16/23 1452           Exercise Comments First full day of exercise!  Patient was oriented to gym and equipment including functions, settings, policies, and procedures.  Patient's individual exercise prescription and treatment plan were reviewed.  All starting workloads were established based on the results of the 6 minute walk test done at initial orientation visit.  The plan for exercise progression was also introduced and progression will be customized based on patient's performance and goals.          Exercise Goals and Review:   Exercise Goals     Row Name 11/15/23 1151             Exercise Goals   Increase Physical Activity Yes       Intervention Provide advice, education, support and counseling about physical activity/exercise needs.;Develop an individualized exercise prescription for aerobic and resistive training based on initial evaluation findings, risk stratification, comorbidities and participant's personal goals.       Expected Outcomes Short Term: Attend rehab on a regular basis to increase amount of physical activity.;Long Term: Add in home exercise to make exercise part of routine and to increase amount of physical activity.;Long Term: Exercising regularly at least 3-5 days a week.       Increase Strength and Stamina Yes       Intervention Provide advice, education, support and counseling about physical activity/exercise needs.;Develop an individualized exercise prescription for aerobic and resistive training based on initial evaluation findings, risk stratification, comorbidities and participant's personal goals.       Expected Outcomes Short Term: Increase workloads from initial exercise prescription for resistance, speed, and METs.;Short Term: Perform resistance training exercises routinely during rehab and add in  resistance training at home;Long Term: Improve cardiorespiratory fitness, muscular endurance and strength as measured by increased METs and functional capacity ( )       Able to understand and use rate of perceived exertion (RPE) scale Yes       Intervention Provide education and explanation on how to use RPE scale       Expected Outcomes Short Term: Able to use RPE daily in rehab to express subjective intensity level;Long Term:  Able to use RPE to guide intensity level when exercising independently       Able to understand and use Dyspnea scale Yes       Intervention Provide education and explanation on how to use Dyspnea scale       Expected Outcomes Short Term: Able to use Dyspnea scale daily in rehab to express subjective sense of shortness of breath during exertion;Long Term: Able to use Dyspnea scale to guide intensity level when exercising independently       Knowledge and understanding of Target Heart Rate Range (THRR) Yes       Intervention Provide education and explanation of THRR including how the numbers were predicted and where they are located for reference       Expected Outcomes Long Term: Able to use THRR to govern intensity when exercising independently;Short Term: Able to state/look up THRR;Short Term: Able to use daily as guideline for intensity in rehab       Able to check pulse independently Yes       Intervention Provide education  and demonstration on how to check pulse in carotid and radial arteries.;Review the importance of being able to check your own pulse for safety during independent exercise       Expected Outcomes Short Term: Able to explain why pulse checking is important during independent exercise;Long Term: Able to check pulse independently and accurately       Understanding of Exercise Prescription Yes       Intervention Provide education, explanation, and written materials on patient's individual exercise prescription       Expected Outcomes Short Term: Able to  explain program exercise prescription;Long Term: Able to explain home exercise prescription to exercise independently          Exercise Goals Re-Evaluation :  Exercise Goals Re-Evaluation     Row Name 11/16/23 1453 12/21/23 1531 01/11/24 1537 02/14/24 0936       Exercise Goal Re-Evaluation   Exercise Goals Review Able to understand and use rate of perceived exertion (RPE) scale;Knowledge and understanding of Target Heart Rate Range (THRR) Increase Physical Activity;Increase Strength and Stamina;Able to understand and use Dyspnea scale;Able to check pulse independently;Knowledge and understanding of Target Heart Rate Range (THRR);Able to understand and use rate of perceived exertion (RPE) scale;Understanding of Exercise Prescription Increase Physical Activity;Increase Strength and Stamina;Able to understand and use Dyspnea scale;Able to understand and use rate of perceived exertion (RPE) scale;Knowledge and understanding of Target Heart Rate Range (THRR);Able to check pulse independently;Understanding of Exercise Prescription Increase Physical Activity;Increase Strength and Stamina;Understanding of Exercise Prescription    Comments Reviewed RPE and dyspnea scale, THR and program prescription with pt today.  Pt voiced understanding and was given a copy of goals to take home. Kalen is doing great in rehab! Daylon notes he is not exercising at home due to financial stress. In talking with him he notes his road being a private road, discussed with him to try walking his road for exercise and it may possibily relieve some stress. Kysen is doing great in rehab! Brighton is back to work now and he is trying to work as much as possible to make up money he lost being out. So he is not exercising much at home. Antino is doing well in rehab. He has been back to work. He is pushing hisself in class but has been feeling good,.    Expected Outcomes Short: Use RPE daily to regulate intensity.  Long: Follow program  prescription in THR. Short: continue to attend rehab. Long: start walking private road for exercise Short: continue to attend rehab. Long: exercising daily for at least 30 minutes. Short: continue to attend rehab. Long: exercising daily for at least 30 minutes.        Discharge Exercise Prescription (Final Exercise Prescription Changes):  Exercise Prescription Changes - 01/27/24 1500       Response to Exercise   Blood Pressure (Admit) 122/66    Blood Pressure (Exit) 124/70    Heart Rate (Admit) 73 bpm    Heart Rate (Exercise) 150 bpm    Heart Rate (Exit) 120 bpm    Rating of Perceived Exertion (Exercise) 13    Duration Continue with 30 min of aerobic exercise without signs/symptoms of physical distress.    Intensity THRR unchanged      Progression   Progression Continue to progress workloads to maintain intensity without signs/symptoms of physical distress.      Resistance Training   Training Prescription Yes    Weight 10    Reps 10-15  Treadmill   MPH 2.8    Grade 0    Minutes 15    METs 3.14      NuStep   Level 10    SPM 101    Minutes 15    METs 5.8          Nutrition:  Target Goals: Understanding of nutrition guidelines, daily intake of sodium 1500mg , cholesterol 200mg , calories 30% from fat and 7% or less from saturated fats, daily to have 5 or more servings of fruits and vegetables.  Biometrics:  Pre Biometrics - 11/15/23 1152       Pre Biometrics   Height 5' 11 (1.803 m)    Weight 249 lb 9 oz (113.2 kg)    Waist Circumference 46 inches    Hip Circumference 43 inches    Waist to Hip Ratio 1.07 %    BMI (Calculated) 34.82    Grip Strength 35.6 kg    Single Leg Stand 30 seconds           Nutrition Therapy Plan and Nutrition Goals:   Nutrition Assessments:  MEDIFICTS Score Key: >=70 Need to make dietary changes  40-70 Heart Healthy Diet <= 40 Therapeutic Level Cholesterol Diet  Flowsheet Row CARDIAC REHAB PHASE II ORIENTATION from  11/15/2023 in [facility] CARDIAC REHABILITATION  Picture Your Plate Total Score on Admission 57   Picture Your Plate Scores: <59 Unhealthy dietary pattern with much room for improvement. 41-50 Dietary pattern unlikely to meet recommendations for good health and room for improvement. 51-60 More healthful dietary pattern, with some room for improvement.  >60 Healthy dietary pattern, although there may be some specific behaviors that could be improved.    Nutrition Goals Re-Evaluation:  Nutrition Goals Re-Evaluation     Row Name 12/21/23 1522 01/11/24 1533 02/14/24 0939         Goals   Nutrition Goal Healthy diet Healthy diet Healthy diet     Comment Benjamim is not following any certain diet currently. Fahed is struggling financially at this time and he is only eating what he can afford. Spoke with him about possible resources for food. Yaiden is eating better now that he has more money for food. Spoke with him about healthy food options and try not to eat a lot of fast food and watch his salt intake. Lemonte is trying to eat better now that he has more money for food. He is trying to not eat as much fastfood but when he is on the road he has too. Need to focuse on healtier options when picking from fast food menu     Expected Outcome Short: reach out to food banks if needed for more balanced meals. Long: Maintain a healthy well balanced diet. Short: monitor salt in diet. Long: eat healthy well balanced meals. Short: monitor salt in diet. Long: eat healthy well balanced meals.        Nutrition Goals Discharge (Final Nutrition Goals Re-Evaluation):  Nutrition Goals Re-Evaluation - 02/14/24 0939       Goals   Nutrition Goal Healthy diet    Comment Mussa is trying to eat better now that he has more money for food. He is trying to not eat as much fastfood but when he is on the road he has too. Need to focuse on healtier options when picking from fast food menu    Expected Outcome Short: monitor  salt in diet. Long: eat healthy well balanced meals.  Psychosocial: Target Goals: Acknowledge presence or absence of significant depression and/or stress, maximize coping skills, provide positive support system. Participant is able to verbalize types and ability to use techniques and skills needed for reducing stress and depression.  Initial Review & Psychosocial Screening:  Initial Psych Review & Screening - 11/12/23 1417       Initial Review   Current issues with Current Anxiety/Panic;History of Depression;Current Depression      Family Dynamics   Good Support System? No    Concerns No support system    Comments When ask who supports him, he states no one.      Barriers   Psychosocial barriers to participate in program The patient should benefit from training in stress management and relaxation.;There are no identifiable barriers or psychosocial needs.      Screening Interventions   Interventions To provide support and resources with identified psychosocial needs;Provide feedback about the scores to participant;Encouraged to exercise    Expected Outcomes Short Term goal: Utilizing psychosocial counselor, staff and physician to assist with identification of specific Stressors or current issues interfering with healing process. Setting desired goal for each stressor or current issue identified.;Long Term Goal: Stressors or current issues are controlled or eliminated.;Short Term goal: Identification and review with participant of any Quality of Life or Depression concerns found by scoring the questionnaire.;Long Term goal: The participant improves quality of Life and PHQ9 Scores as seen by post scores and/or verbalization of changes          Quality of Life Scores:  Quality of Life - 11/15/23 1158       Quality of Life   Select Quality of Life      Quality of Life Scores   Health/Function Pre 15.27 %    Socioeconomic Pre 16.13 %    Psych/Spiritual Pre 12.21 %     Family Pre 15.5 %    GLOBAL Pre 14.89 %         Scores of 19 and below usually indicate a poorer quality of life in these areas.  A difference of  2-3 points is a clinically meaningful difference.  A difference of 2-3 points in the total score of the Quality of Life Index has been associated with significant improvement in overall quality of life, self-image, physical symptoms, and general health in studies assessing change in quality of life.  PHQ-9: Review Flowsheet  More data exists      11/15/2023 10/02/2021 06/26/2021 04/03/2021 01/02/2021  Depression screen PHQ 2/9  Decreased Interest 3 3 3 3 3   Down, Depressed, Hopeless 2 3 3 3 3   PHQ - 2 Score 5 6 6 6 6   Altered sleeping 0 3 3 3 3   Tired, decreased energy 0 3 3 3 3   Change in appetite 0 3 3 3 3   Feeling bad or failure about yourself  3 3 3 3 3   Trouble concentrating 0 3 3 3 3   Moving slowly or fidgety/restless 0 3 3 3 3   Suicidal thoughts 0 3 3 3 3   PHQ-9 Score 8  27  27  27  27    Difficult doing work/chores Not difficult at all - Extremely dIfficult - -    Details       Data saved with a previous flowsheet row definition        Interpretation of Total Score  Total Score Depression Severity:  1-4 = Minimal depression, 5-9 = Mild depression, 10-14 = Moderate depression, 15-19 = Moderately severe depression, 20-27 =  Severe depression   Psychosocial Evaluation and Intervention:  Psychosocial Evaluation - 11/12/23 1418       Psychosocial Evaluation & Interventions   Interventions Stress management education;Relaxation education;Encouraged to exercise with the program and follow exercise prescription    Comments Patient referred to cardiac rehab with NSTEMI/Stent placement. He has depression and anxiety listed in his medical history but says he has no more than his usual. He is currently not being treated. He lives alone and says he has no support person. 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 Olivia Pavy, PA put lifting restrictions of no  more than 10 lbs at his visit and he said he could not work with these restrictions. He is a current smoker 0.5 ppd and vapes also and is not ready to quit. His goals for the program are to be able to return to work. He says he will still be able to come to sessions after he returns to work. He has no barriers identified to complete the program.    Expected Outcomes Short Term: Patient will start the program and attend consistently. Long Term: Patient will complete the program meeting personal goals.    Continue Psychosocial Services  Follow up required by staff          Psychosocial Re-Evaluation:  Psychosocial Re-Evaluation     Row Name 12/21/23 1516 01/11/24 1529 02/14/24 0937         Psychosocial Re-Evaluation   Current issues with Current Stress Concerns;Current Sleep Concerns;Current Anxiety/Panic Current Stress Concerns Current Stress Concerns     Comments Laroy is doing great in rehab! 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 He notes not having any money, he has some friends who have helped him a little, but no family support. He notes being behind on his mortgage and that he doesn't have much food left. Spoke to him about possible resources available, but he also mentions he has some things he is going to sell to get some cash to help with bills. He notes he should be cleared for back to work soon within the next week or two. Charod has now went back to work! He feels much better, he notes things are getting back on track, but it will take awhile to get back to where I was financially. devion has went back to work and has been feeling better. He has been placed on trazodone  as needed to help with stress. He is worried about where he is financially     Expected Outcomes Short: get cleared by MD to go back to work. Long: work on having healthy stress  outlets and once back to work start to catch up on bills. Short: continue to attend rehab. Long: continue to work and catch up on bills. Short: continue to attend rehab. Long: continue to work and catch up on bills.     Interventions Encouraged to attend Cardiac Rehabilitation for the exercise Encouraged to attend Cardiac Rehabilitation for the exercise Encouraged to attend Cardiac Rehabilitation for the exercise     Continue Psychosocial Services  Follow up required by staff Follow up required by staff Follow up required by staff        Psychosocial Discharge (Final Psychosocial Re-Evaluation):  Psychosocial Re-Evaluation - 02/14/24 0937       Psychosocial Re-Evaluation   Current issues with Current Stress Concerns  Comments jessiah has went back to work and has been feeling better. He has been placed on trazodone  as needed to help with stress. He is worried about where he is financially    Expected Outcomes Short: continue to attend rehab. Long: continue to work and catch up on bills.    Interventions Encouraged to attend Cardiac Rehabilitation for the exercise    Continue Psychosocial Services  Follow up required by staff          Vocational Rehabilitation: Provide vocational rehab assistance to qualifying candidates.   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          Education: Education Goals: Education classes will be provided on a weekly basis, covering required topics. Participant will state understanding/return demonstration of topics presented.  Learning Barriers/Preferences:  Learning Barriers/Preferences - 11/12/23 1416       Learning Barriers/Preferences   Learning Barriers None    Learning Preferences Written  Material;Audio;Skilled Demonstration          Education Topics: Hypertension, Hypertension Reduction -Define heart disease and high blood pressure. Discus how high blood pressure affects the body and ways to reduce high blood pressure.   Exercise and Your Heart -Discuss why it is important to exercise, the FITT principles of exercise, normal and abnormal responses to exercise, and how to exercise safely.   Angina -Discuss definition of angina, causes of angina, treatment of angina, and how to decrease risk of having angina.   Cardiac Medications -Review what the following cardiac medications are used for, how they affect the body, and side effects that may occur when taking the medications.  Medications include Aspirin , Beta blockers, calcium  channel blockers, ACE Inhibitors, angiotensin receptor blockers, diuretics, digoxin, and antihyperlipidemics.   Congestive Heart Failure -Discuss the definition of CHF, how to live with CHF, the signs and symptoms of CHF, and how keep track of weight and sodium intake.   Heart Disease and Intimacy -Discus the effect sexual activity has on the heart, how changes occur during intimacy as we age, and safety during sexual activity.   Smoking Cessation / COPD -Discuss different methods to quit smoking, the health benefits of quitting smoking, and the definition of COPD.   Nutrition I: Fats -Discuss the types of cholesterol, what cholesterol does to the heart, and how cholesterol levels can be controlled.   Nutrition II: Labels -Discuss the different components of food labels and how to read food label   Heart Parts/Heart Disease and PAD -Discuss the anatomy of the heart, the pathway of blood circulation through the heart, and these are affected by heart disease.   Stress I: Signs and Symptoms -Discuss the causes of stress, how stress may lead to anxiety and depression, and ways to limit stress.   Stress II: Relaxation -Discuss  different types of relaxation techniques to limit stress.   Warning Signs of Stroke / TIA -Discuss definition of a stroke, what the signs and symptoms are of a stroke, and how to identify when someone is having stroke.   Knowledge Questionnaire Score:   Core Components/Risk Factors/Patient Goals at Admission:  Personal Goals and Risk Factors at Admission - 11/12/23 1416       Core Components/Risk Factors/Patient Goals on Admission  Weight Management Obesity    Diabetes Yes    Intervention Provide education about signs/symptoms and action to take for hypo/hyperglycemia.;Provide education about proper nutrition, including hydration, and aerobic/resistive exercise prescription along with prescribed medications to achieve blood glucose in normal ranges: Fasting glucose 65-99 mg/dL    Expected Outcomes Short Term: Participant verbalizes understanding of the signs/symptoms and immediate care of hyper/hypoglycemia, proper foot care and importance of medication, aerobic/resistive exercise and nutrition plan for blood glucose control.;Long Term: Attainment of HbA1C < 7%.    Hypertension Yes    Intervention Provide education on lifestyle modifcations including regular physical activity/exercise, weight management, moderate sodium restriction and increased consumption of fresh fruit, vegetables, and low fat dairy, alcohol moderation, and smoking cessation.;Monitor prescription use compliance.    Expected Outcomes Short Term: Continued assessment and intervention until BP is < 140/35mm HG in hypertensive participants. < 130/40mm HG in hypertensive participants with diabetes, heart failure or chronic kidney disease.;Long Term: Maintenance of blood pressure at goal levels.    Lipids Yes    Intervention Provide education and support for participant on nutrition & aerobic/resistive exercise along with prescribed medications to achieve LDL 70mg , HDL >40mg .    Expected Outcomes Short Term: Participant  states understanding of desired cholesterol values and is compliant with medications prescribed. Participant is following exercise prescription and nutrition guidelines.;Long Term: Cholesterol controlled with medications as prescribed, with individualized exercise RX and with personalized nutrition plan. Value goals: LDL < 70mg , HDL > 40 mg.          Core Components/Risk Factors/Patient Goals Review:   Goals and Risk Factor Review     Row Name 12/21/23 1525 01/11/24 1535 02/14/24 0940         Core Components/Risk Factors/Patient Goals Review   Personal Goals Review Hypertension;Lipids;Diabetes Hypertension;Lipids;Diabetes Hypertension;Lipids;Diabetes     Review Huck is struggling financially. Boaz notes that he got 90 day supply of all of his meds right before his money ran out after not being able to go back to work. So he notes having a taking all of his medications. He is also making sure to keep all his follow ups with his doctors so he can get back to work asap. Nazario is better now financially, so he has all of his medications he needs. spoke with him about making sure to take medications as prescribed and checking blood pressure and blood sugars at home. Lydon has been doing well in rehab. He is trying to take his BP and is taking his medications as prescribed     Expected Outcomes Short: Get back to work soon. Long: continue to take medications as prescribed by MD. Short: monitor blood pressure and blood sugar at home. Long: continue to take all medications as prescribed by MD. Short: monitor blood pressure and blood sugar at home. Long: continue to take all medications as prescribed by MD.        Core Components/Risk Factors/Patient Goals at Discharge (Final Review):   Goals and Risk Factor Review - 02/14/24 0940       Core Components/Risk Factors/Patient Goals Review   Personal Goals Review Hypertension;Lipids;Diabetes    Review Aydrian has been doing well in rehab. He is trying to  take his BP and is taking his medications as prescribed    Expected Outcomes Short: monitor blood pressure and blood sugar at home. Long: continue to take all medications as prescribed by MD.          ITP Comments:  ITP Comments  Row Name 11/12/23 1429 11/15/23 1157 11/16/23 1452 12/01/23 1816 12/29/23 0924   ITP Comments Virtual orientation visit completed for cardiac rehab with NSTEMI/Stent placement. On-site orientation visit scheduled for 11/15/23 at 10:30. Patient arrived for 1st visit/orientation/education at 1030. Patient was referred to CR by Alm Harding/Dr. Branch attending due to NSTEMI/Stent placement. During orientation advised patient on arrival and appointment times what to wear, what to do before, during and after exercise. Reviewed attendance and class policy.  Pt is scheduled to return Cardiac Rehab on 11/16/23 at 1500. Pt was advised to come to class 15 minutes before class starts.  Discussed RPE/Dpysnea scales. Patient participated in warm up stretches. Patient was able to complete 6 minute walk test.  Telemetry:NSR. Patient was measured for the equipment. Discussed equipment safety with patient. Took patient pre-anthropometric measurements. Patient finished visit at 1145. First full day of exercise!  Patient was oriented to gym and equipment including functions, settings, policies, and procedures.  Patient's individual exercise prescription and treatment plan were reviewed.  All starting workloads were established based on the results of the 6 minute walk test done at initial orientation visit.  The plan for exercise progression was also introduced and progression will be customized based on patient's performance and goals. 30 day review completed. ITP sent to Dr. Dorn Ross, Medical Director of Cardiac Rehab. Continue with ITP unless changes are made by physician.  Newer to program 30 day review completed. ITP sent to Dr. Dorn Ross, Medical Director of Cardiac Rehab.  Continue with ITP unless changes are made by physician.    Row Name 01/25/24 1132 02/22/24 1649         ITP Comments 30 day review completed. ITP sent to Dr. Dorn Ross, Medical Director of Cardiac Rehab. Continue with ITP unless changes are made by physician. 30 day review completed. ITP sent to Dr. Dorn Ross, Medical Director of Cardiac Rehab. Continue with ITP unless changes are made by physician.         Comments: 30 day review     [1]  Current Outpatient Medications:    amLODipine  (NORVASC ) 2.5 MG tablet, TAKE 1 TABLET BY MOUTH DAILY, Disp: 90 tablet, Rfl: 3   Ascorbic Acid (VITAMIN C) 1000 MG tablet, Take 2,000 mg by mouth daily., Disp: , Rfl:    aspirin  EC 81 MG tablet, Take 81 mg by mouth daily. , Disp: , Rfl:    atorvastatin  (LIPITOR ) 80 MG tablet, TAKE 1 TABLET BY MOUTH DAILY, Disp: 90 tablet, Rfl: 3   b complex vitamins capsule, Take 1 capsule by mouth daily., Disp: , Rfl:    clopidogrel  (PLAVIX ) 75 MG tablet, Take 1 tablet (75 mg total) by mouth daily with breakfast., Disp: 90 tablet, Rfl: 3   empagliflozin  (JARDIANCE ) 10 MG TABS tablet, Take 1 tablet (10 mg total) by mouth daily before breakfast., Disp: 90 tablet, Rfl: 3   lisinopril  (ZESTRIL ) 5 MG tablet, Take 1 tablet (5 mg total) by mouth daily., Disp: 90 tablet, Rfl: 3   metoprolol  succinate (TOPROL -XL) 50 MG 24 hr tablet, TAKE 1 TABLET BY MOUTH EVERY DAY with OR immediately AFTER a meal, Disp: 90 tablet, Rfl: 3   nitroGLYCERIN  (NITROSTAT ) 0.4 MG SL tablet, Place 1 tablet (0.4 mg total) under the tongue every 5 (five) minutes x 3 doses as needed for chest pain (if no relief after 2nd dose, proceed to ED or call 911)., Disp: 25 tablet, Rfl: 3   pantoprazole  (PROTONIX ) 40 MG tablet, Take 1 tablet (40 mg  total) by mouth daily., Disp: 90 tablet, Rfl: 3   tirzepatide  (MOUNJARO ) 7.5 MG/0.5ML Pen, Inject 7.5 mg into the skin once a week., Disp: 6 mL, Rfl: 3   traZODone  (DESYREL ) 50 MG tablet, Take 0.5-1 tablets (25-50  mg total) by mouth at bedtime as needed for sleep., Disp: 90 tablet, Rfl: 3 [2]  Social History Tobacco Use  Smoking Status Every Day   Current packs/day: 1.00   Average packs/day: 1 pack/day for 31.1 years (31.1 ttl pk-yrs)   Types: Cigarettes   Start date: 01/03/1993  Smokeless Tobacco Never

## 2024-02-22 NOTE — Progress Notes (Signed)
 Daily Session Note  Patient Details  Name: Carl [name] MRN: [id number] Date of Birth: 07/18/1975 Referring Provider:   Flowsheet Row CARDIAC REHAB PHASE II ORIENTATION from 11/15/2023 in [facility] CARDIAC REHABILITATION  Referring Provider Anner Lenis MD  Suzzette Carrier MD sending everything]    Encounter Date: 02/22/2024  Check In:  Session Check In - 02/22/24 1454       Check-In   Supervising physician immediately available to respond to emergencies See telemetry face sheet for immediately available MD    Location [facility]    Staff Present Laymon Rattler, BSN, RN, WTA-C;Victoria Zina, RN    Virtual Visit No    Medication changes reported     No    Fall or balance concerns reported    No    Tobacco Cessation No Change    Warm-up and Cool-down Performed on first and last piece of equipment    Resistance Training Performed Yes    VAD Patient? No    PAD/SET Patient? No      Pain Assessment   Currently in Pain? No/denies          Capillary Blood Glucose: No results found for this or any previous visit (from the past 24 hours).    Tobacco Use History[1]  Goals Met:  Independence with exercise equipment Exercise tolerated well No report of concerns or symptoms today Strength training completed today  Goals Unmet:  Not Applicable  Comments: Pt able to follow exercise prescription today without complaint.  Will continue to monitor for progression.        [1]  Social History Tobacco Use  Smoking Status Every Day   Current packs/day: 1.00   Average packs/day: 1 pack/day for 31.1 years (31.1 ttl pk-yrs)   Types: Cigarettes   Start date: 01/03/1993  Smokeless Tobacco Never

## 2024-02-24 ENCOUNTER — Encounter (HOSPITAL_COMMUNITY)

## 2024-02-25 ENCOUNTER — Other Ambulatory Visit

## 2024-02-25 ENCOUNTER — Other Ambulatory Visit: Payer: Self-pay | Admitting: Family

## 2024-02-27 LAB — CLOSTRIDIUM DIFFICILE EIA: C difficile Toxins A+B, EIA: NEGATIVE

## 2024-02-29 ENCOUNTER — Encounter (HOSPITAL_COMMUNITY)
Admission: RE | Admit: 2024-02-29 | Discharge: 2024-02-29 | Disposition: A | Discharge status: Home / Self Care | Source: Ambulatory Visit | Attending: Cardiology | Admitting: Cardiology

## 2024-02-29 DIAGNOSIS — Z955 Presence of coronary angioplasty implant and graft: Secondary | ICD-10-CM | POA: Insufficient documentation

## 2024-02-29 DIAGNOSIS — I214 Non-ST elevation (NSTEMI) myocardial infarction: Secondary | ICD-10-CM | POA: Insufficient documentation

## 2024-02-29 NOTE — Progress Notes (Signed)
 Daily Session Note  Patient Details  Name: Carl [name] MRN: [id number] Date of Birth: [birth year] Referring Provider:   Flowsheet Row CARDIAC REHAB PHASE II ORIENTATION from 11/15/2023 in [facility] CARDIAC REHABILITATION  Referring Provider Anner Lenis MD  Suzzette Carrier MD sending everything]    Encounter Date: 02/29/2024  Check In:  Session Check In - 02/29/24 1737       Check-In   Supervising physician immediately available to respond to emergencies See telemetry face sheet for immediately available MD    Location [facility]    Staff Present Powell Benders, BS, Exercise Physiologist;Brittany Jackquline, BSN, RN, WTA-C;Victoria Zina, RN    Virtual Visit No    Medication changes reported     No    Fall or balance concerns reported    No    Warm-up and Cool-down Performed on first and last piece of equipment    Resistance Training Performed Yes    VAD Patient? No    PAD/SET Patient? No      Pain Assessment   Currently in Pain? No/denies          Capillary Blood Glucose: No results found for this or any previous visit (from the past 24 hours).    Tobacco Use History[1]  Goals Met:  Independence with exercise equipment Exercise tolerated well No report of concerns or symptoms today Strength training completed today  Goals Unmet:  Not Applicable  Comments:  Pt able to follow exercise prescription today without complaint.  Will continue to monitor for progression.        [1]  Social History Tobacco Use  Smoking Status Every Day   Current packs/day: 1.00   Average packs/day: 1 pack/day for 31.2 years (31.2 ttl pk-yrs)   Types: Cigarettes   Start date: 01/03/1993  Smokeless Tobacco Never

## 2024-03-02 ENCOUNTER — Encounter (HOSPITAL_COMMUNITY)

## 2024-03-02 ENCOUNTER — Ambulatory Visit: Payer: Self-pay | Admitting: Family

## 2024-03-07 ENCOUNTER — Encounter (HOSPITAL_COMMUNITY)
Admission: RE | Admit: 2024-03-07 | Discharge: 2024-03-07 | Disposition: A | Source: Ambulatory Visit | Attending: Cardiology

## 2024-03-07 VITALS — Ht 71.0 in | Wt 241.4 lb

## 2024-03-07 DIAGNOSIS — I214 Non-ST elevation (NSTEMI) myocardial infarction: Secondary | ICD-10-CM

## 2024-03-07 DIAGNOSIS — Z955 Presence of coronary angioplasty implant and graft: Secondary | ICD-10-CM

## 2024-03-07 NOTE — Progress Notes (Signed)
 Daily Session Note  Patient Details  Name: Carl [name] MRN: [id number] Date of Birth: 30-Jul-1975 Referring Provider:   Flowsheet Row CARDIAC REHAB PHASE II ORIENTATION from 11/15/2023 in [facility] CARDIAC REHABILITATION  Referring Provider Anner Lenis MD  Suzzette Carrier MD sending everything]    Encounter Date: 03/07/2024  Check In:  Session Check In - 03/07/24 1514       Check-In   Supervising physician immediately available to respond to emergencies See telemetry face sheet for immediately available MD    Location [facility]    Staff Present Powell Benders, BS, Exercise Physiologist;Jala Dundon Jackquline, BSN, RN, WTA-C;Victoria Zina, RN    Virtual Visit No    Medication changes reported     No    Fall or balance concerns reported    No    Tobacco Cessation No Change    Warm-up and Cool-down Performed on first and last piece of equipment    Resistance Training Performed Yes    VAD Patient? No    PAD/SET Patient? No      Pain Assessment   Currently in Pain? No/denies          Capillary Blood Glucose: No results found for this or any previous visit (from the past 24 hours).    Tobacco Use History[1]  Goals Met:  Independence with exercise equipment Exercise tolerated well No report of concerns or symptoms today Strength training completed today  Goals Unmet:  Not Applicable  Comments: Pt able to follow exercise prescription today without complaint.  Will continue to monitor for progression.        [1]  Social History Tobacco Use  Smoking Status Every Day   Current packs/day: 1.00   Average packs/day: 1 pack/day for 31.2 years (31.2 ttl pk-yrs)   Types: Cigarettes   Start date: 01/03/1993  Smokeless Tobacco Never

## 2024-03-08 ENCOUNTER — Encounter: Payer: Self-pay | Admitting: Family Medicine

## 2024-03-08 ENCOUNTER — Ambulatory Visit: Admitting: Family Medicine

## 2024-03-08 VITALS — BP 112/68 | HR 73 | Temp 97.7°F | Ht 71.0 in | Wt 243.0 lb

## 2024-03-08 DIAGNOSIS — F331 Major depressive disorder, recurrent, moderate: Secondary | ICD-10-CM | POA: Diagnosis not present

## 2024-03-08 DIAGNOSIS — K219 Gastro-esophageal reflux disease without esophagitis: Secondary | ICD-10-CM | POA: Diagnosis not present

## 2024-03-08 MED ORDER — LANSOPRAZOLE 30 MG PO CPDR: 30.0000 mg | DELAYED_RELEASE_CAPSULE | Freq: Two times a day (BID) | ORAL | 3 refills | Status: AC

## 2024-03-08 MED ORDER — SERTRALINE HCL 50 MG PO TABS: 50.0000 mg | ORAL_TABLET | Freq: Every day | ORAL | 2 refills | Status: AC

## 2024-03-08 NOTE — Progress Notes (Signed)
 "  BP 112/68   Pulse 73   Temp 97.7 F (36.5 C)   Ht 5' 11 (1.803 m)   Wt 243 lb (110.2 kg)   SpO2 96%   BMI 33.89 kg/m    Subjective:   Patient ID: Carl [name], male    DOB: 06-09-1975, 49 y.o.   MRN: [id number]  HPI: Carl [name] is a 49 y.o. male presenting on 03/08/2024 for Diarrhea   Discussed the use of AI scribe software for clinical note transcription with the patient, who gave verbal consent to proceed.  History of Present Illness   Carl [name] is a 49 year old male with a history of heart attack who presents with gastrointestinal symptoms and depression.  Diarrhea - Diarrhea associated with recent changes in medication regimen - Alternating between different doses of Mounjaro  - Stool sample negative for C. difficile  Gastrointestinal symptoms - Foul-smelling burps present - Currently taking pantoprazole  40 mg - Symptoms have persisted since switching from omeprazole  to pantoprazole  due to interaction with Plavix  - No heartburn, burning pain, or acid pain - No frequent burping  Depression and anxiety - Increased depression and anxiety - Attributes anxiety to work-related stress, specifically [employer] certification processing issues resulting in lost work time - Seeking mental health support - Interested in non-sedating medication options  Medication access issues - Recent change in insurance coverage no longer includes [employer] - Difficulty accessing medications such as Mounjaro  and Jardiance           Relevant past medical, surgical, family and social history reviewed and updated as indicated. Interim medical history since our last visit reviewed. Allergies and medications reviewed and updated.  Review of Systems  Constitutional:  Negative for chills and fever.  Eyes:  Negative for visual disturbance.  Respiratory:  Negative for shortness of breath and wheezing.   Cardiovascular:  Negative for chest pain and leg swelling.  Gastrointestinal:   Positive for abdominal distention and diarrhea. Negative for abdominal pain, blood in stool, nausea and rectal pain.  Musculoskeletal:  Negative for back pain and gait problem.  Skin:  Negative for rash.  Neurological:  Negative for dizziness and numbness.  All other systems reviewed and are negative.   Per HPI unless specifically indicated above   Allergies as of 03/08/2024   No Known Allergies      Medication List        Accurate as of March 08, 2024  3:50 PM. If you have any questions, ask your nurse or doctor.          STOP taking these medications    pantoprazole  40 MG tablet Commonly known as: Protonix  Stopped by: Fonda Levins, MD       TAKE these medications    amLODipine  2.5 MG tablet Commonly known as: NORVASC  TAKE 1 TABLET BY MOUTH DAILY   aspirin  EC 81 MG tablet Take 81 mg by mouth daily.   atorvastatin  80 MG tablet Commonly known as: LIPITOR  TAKE 1 TABLET BY MOUTH DAILY   b complex vitamins capsule Take 1 capsule by mouth daily.   clopidogrel  75 MG tablet Commonly known as: PLAVIX  Take 1 tablet (75 mg total) by mouth daily with breakfast.   empagliflozin  10 MG Tabs tablet Commonly known as: Jardiance  Take 1 tablet (10 mg total) by mouth daily before breakfast.   lansoprazole  30 MG capsule Commonly known as: PREVACID  Take 1 capsule (30 mg total) by mouth 2 (two) times daily before a meal. Started by: Fonda  Jeliyah Middlebrooks, MD   lisinopril  5 MG tablet Commonly known as: ZESTRIL  Take 1 tablet (5 mg total) by mouth daily.   metoprolol  succinate 50 MG 24 hr tablet Commonly known as: TOPROL -XL TAKE 1 TABLET BY MOUTH EVERY DAY with OR immediately AFTER a meal   nitroGLYCERIN  0.4 MG SL tablet Commonly known as: NITROSTAT  Place 1 tablet (0.4 mg total) under the tongue every 5 (five) minutes x 3 doses as needed for chest pain (if no relief after 2nd dose, proceed to ED or call 911).   sertraline  50 MG tablet Commonly known as: Zoloft  Take  1 tablet (50 mg total) by mouth daily. Started by: Fonda Levins, MD   tirzepatide  7.5 MG/0.5ML Pen Commonly known as: MOUNJARO  Inject 7.5 mg into the skin once a week.   traZODone  50 MG tablet Commonly known as: DESYREL  Take 0.5-1 tablets (25-50 mg total) by mouth at bedtime as needed for sleep.   vitamin C 1000 MG tablet Take 2,000 mg by mouth daily.         Objective:   BP 112/68   Pulse 73   Temp 97.7 F (36.5 C)   Ht 5' 11 (1.803 m)   Wt 243 lb (110.2 kg)   SpO2 96%   BMI 33.89 kg/m   Wt Readings from Last 3 Encounters:  03/08/24 243 lb (110.2 kg)  03/08/24 241 lb 6.5 oz (109.5 kg)  02/18/24 247 lb 6.4 oz (112.2 kg)    Physical Exam Physical Exam   MEASUREMENTS: Weight- 240. CHEST: Lungs clear to auscultation. CARDIOVASCULAR: Regular heart sounds. ABDOMEN: Abdomen non-tender.         Assessment & Plan:   Problem List Items Addressed This Visit       Digestive   GERD (gastroesophageal reflux disease) (Chronic)   Relevant Medications   lansoprazole  (PREVACID ) 30 MG capsule   Other Visit Diagnoses       Depression, major, recurrent, moderate (HCC)    -  Primary   Relevant Medications   sertraline  (ZOLOFT ) 50 MG tablet   Other Relevant Orders   Ambulatory referral to Psychology          Gastroesophageal reflux disease Persistent foul burps likely due to pantoprazole 's interaction with Plavix . - Find alternative reflux medication not interfering with Plavix .  Diarrhea Likely related to fluctuating Mounjaro  doses. Stool sample negative for C. diff. - Continue consistent Mounjaro  dosing. - Monitor symptoms and report changes.  Depression and anxiety Increased symptoms due to work stress and insurance issues. Previous medication discontinued due to Plavix  interaction. Zoloft  considered as non-sedating option. - Prescribed Zoloft . - Referred to mental health services.          Follow up plan: Return if symptoms worsen or fail to  improve, for 3 to 4-week anxiety depression recheck.  Counseling provided for all of the vaccine components Orders Placed This Encounter  Procedures   Ambulatory referral to Psychology    Fonda Levins, MD [facility] 03/08/2024, 3:50 PM     "

## 2024-03-08 NOTE — Patient Instructions (Signed)
 Discharge Patient Instructions  Patient Details  Name: Carl [name] MRN: [id number] Date of Birth: 1975-05-17 Referring Provider:  Dettinger, Fonda LABOR, MD   Number of Visits: 89  Reason for Discharge:  Patient reached a stable level of exercise. Patient independent in their exercise. Patient has met program and personal goals.  Smoking History:  Social History   Tobacco Use  Smoking Status Every Day   Current packs/day: 1.00   Average packs/day: 1 pack/day for 31.2 years (31.2 ttl pk-yrs)   Types: Cigarettes   Start date: 01/03/1993  Smokeless Tobacco Never    Diagnosis:  NSTEMI (non-ST elevated myocardial infarction) (HCC)  Status post coronary artery stent placement  Initial Exercise Prescription:  Initial Exercise Prescription - 11/15/23 1100       Date of Initial Exercise RX and Referring Provider   Date 11/15/23    Referring Provider Anner Lenis MD   Alvan Carrier MD sending everything     Treadmill   MPH 1.8    Grade 0.5    Minutes 15    METs 2.5      NuStep   Level 5    SPM 50    Minutes 15    METs 2      Prescription Details   Frequency (times per week) 2    Duration Progress to 30 minutes of continuous aerobic without signs/symptoms of physical distress      Intensity   THRR 40-80% of Max Heartrate 126-156    Ratings of Perceived Exertion 11-13    Perceived Dyspnea 0-4      Resistance Training   Training Prescription Yes    Weight 5    Reps 10-15          Discharge Exercise Prescription (Final Exercise Prescription Changes):  Exercise Prescription Changes - 02/22/24 1500       Response to Exercise   Blood Pressure (Admit) 114/74    Blood Pressure (Exit) 114/78    Heart Rate (Admit) 85 bpm    Heart Rate (Exercise) 145 bpm    Heart Rate (Exit) 90 bpm    Rating of Perceived Exertion (Exercise) 14    Duration Continue with 30 min of aerobic exercise without signs/symptoms of physical distress.    Intensity THRR unchanged       Progression   Progression Continue to progress workloads to maintain intensity without signs/symptoms of physical distress.      Resistance Training   Weight 7    Reps 10-15      Treadmill   MPH 2.2    Grade 14    Minutes 15    METs 6.93      NuStep   Level 10    SPM 96    Minutes 15    METs 6.3          Functional Capacity:  6 Minute Walk     Row Name 11/15/23 1147 03/08/24 1032       6 Minute Walk   Phase Initial Discharge    Distance 1150 feet 1300 feet    Distance % Change -- 13.04 %    Distance Feet Change -- 150 ft    Walk Time 6 minutes 6 minutes    # of Rest Breaks 0 0    MPH 2.18 2.46    METS 3.47 3.66    RPE 8 10    VO2 Peak 12.16 12.8    Symptoms No No    Resting HR 97 bpm 86  bpm    Resting BP 110/80 120/60    Resting Oxygen Saturation  96 % 96 %    Exercise Oxygen Saturation  during 6 min walk 96 % 96 %    Max Ex. HR 111 bpm 106 bpm    Max Ex. BP 130/78 118/80    2 Minute Post BP 120/78 --       Quality of Life:  Quality of Life - 11/15/23 1158       Quality of Life   Select Quality of Life      Quality of Life Scores   Health/Function Pre 15.27 %    Socioeconomic Pre 16.13 %    Psych/Spiritual Pre 12.21 %    Family Pre 15.5 %    GLOBAL Pre 14.89 %         Nutrition & Weight - Outcomes:  Pre Biometrics - 11/15/23 1152       Pre Biometrics   Height 5' 11 (1.803 m)    Weight 113.2 kg    Waist Circumference 46 inches    Hip Circumference 43 inches    Waist to Hip Ratio 1.07 %    BMI (Calculated) 34.82    Grip Strength 35.6 kg    Single Leg Stand 30 seconds          Post Biometrics - 03/08/24 1037        Post  Biometrics   Height 5' 11 (1.803 m)    Weight 109.5 kg    Waist Circumference 46 inches    Hip Circumference 44 inches    Waist to Hip Ratio 1.05 %    BMI (Calculated) 33.68    Grip Strength 15.8 kg    Single Leg Stand 30 seconds          Goals reviewed with patient; copy given to patient.

## 2024-03-09 ENCOUNTER — Encounter (HOSPITAL_COMMUNITY)
Admission: RE | Admit: 2024-03-09 | Discharge: 2024-03-09 | Disposition: A | Source: Ambulatory Visit | Attending: Cardiology

## 2024-03-09 DIAGNOSIS — I214 Non-ST elevation (NSTEMI) myocardial infarction: Secondary | ICD-10-CM

## 2024-03-09 DIAGNOSIS — Z955 Presence of coronary angioplasty implant and graft: Secondary | ICD-10-CM

## 2024-03-09 NOTE — Progress Notes (Signed)
 Cardiac Individual Treatment Plan  Patient Details  Name: Carl [name] MRN: [id number] Date of Birth: 09/26/1975 Referring Provider:   Flowsheet Row CARDIAC REHAB PHASE II ORIENTATION from 11/15/2023 in [facility] CARDIAC REHABILITATION  Referring Provider Carl Lenis MD  Carl Carrier MD sending everything]    Initial Encounter Date:  Flowsheet Row CARDIAC REHAB PHASE II ORIENTATION from 11/15/2023 in [name] CARDIAC [facility]  Date 11/15/23    Visit Diagnosis: NSTEMI (non-ST elevated myocardial infarction) [facility])  Status post coronary artery stent placement  Patient's Home Medications on Admission: Current Medications[1]  Past Medical History: Past Medical History:  Diagnosis Date   Anxiety    CAD in native artery    a. cath in 2015 [facility] showing occluded proximal LAD, LAD filled with left-left and right-left collaterals, otherwise 20-30% prox-mid RCA, LVEF 55%. b. Abnormal nuc 02/2016 - [facility] medically. c. redo cath in 06/2017 showing 100% Prox LAD stenosis, 50% RCA, and 40% RPDA and underwent CTO PCI of the LAD with DESx2 to in 08/2017   Daily headache    since I started Plavix  (08/25/2017)   Depression    Diabetes mellitus type 2 in obese    Hyperlipidemia    Hypertension    OSA on CPAP     Tobacco Use: Tobacco Use History[2]  Labs: Review Flowsheet  More data exists      Latest Ref Rng & Units 12/31/2022 04/08/2023 07/08/2023 10/14/2023 01/27/2024  Labs for ITP Cardiac and Pulmonary Rehab  Cholestrol 100 - 199 mg/dL 891  899  - 897  -  LDL (calc) 0 - 99 mg/dL 53  48  - 51  -  HDL-C >39 mg/dL 34  31  - 34  -  Trlycerides 0 - 149 mg/dL 883  886  - 87  -  Hemoglobin A1c 4.8 - 5.6 % 5.3  5.5  5.5  5.5  5.2      Exercise Target Goals: Exercise Program Goal: Individual exercise prescription set using results from initial 6 min walk test and THRR while considering  patients activity barriers and safety.   Exercise Prescription Goal: Initial exercise  prescription builds to 30-45 minutes a day of aerobic activity, 2-3 days per week.  Home exercise guidelines will be given to patient during program as part of exercise prescription that the participant will acknowledge.   Education: Aerobic Exercise: - Group verbal and visual presentation on the components of exercise prescription. Introduces F.I.T.T principle from ACSM for exercise prescriptions.  Reviews F.I.T.T. principles of aerobic exercise including progression. Written material provided at class time.   Education: Resistance Exercise: - Group verbal and visual presentation on the components of exercise prescription. Introduces F.I.T.T principle from ACSM for exercise prescriptions  Reviews F.I.T.T. principles of resistance exercise including progression. Written material provided at class time.    Education: Exercise & Equipment Safety: - Individual verbal instruction and demonstration of equipment use and safety with use of the equipment.   Education: Exercise Physiology & General Exercise Guidelines: - Group verbal and written instruction with models to review the exercise physiology of the cardiovascular system and associated critical values. Provides general exercise guidelines with specific guidelines to those with heart or lung disease. Written material provided at class time. Flowsheet Row CARDIAC REHAB PHASE II EXERCISE from 03/09/2024 in [place] CARDIAC REHABILITATION  Education need identified 11/15/23  Date 12/16/23  Educator [facility]  Instruction Review Code 1- [employer] Understanding    Education: Flexibility, Balance, Mind/Body Relaxation: - Group verbal and  visual presentation with interactive activity on the components of exercise prescription. Introduces F.I.T.T principle from ACSM for exercise prescriptions. Reviews F.I.T.T. principles of flexibility and balance exercise training including progression. Also discusses the mind body connection.  Reviews various  relaxation techniques to help reduce and manage stress (i.e. Deep breathing, progressive muscle relaxation, and visualization). Balance handout provided to take home. Written material provided at class time.   Activity Barriers & Risk Stratification:  Activity Barriers & Cardiac Risk Stratification - 11/12/23 1408       Activity Barriers & Cardiac Risk Stratification   Activity Barriers None    Cardiac Risk Stratification High          6 Minute Walk:  6 Minute Walk     Row Name 11/15/23 1147 03/08/24 1032       6 Minute Walk   Phase Initial Discharge    Distance 1150 feet 1300 feet    Distance % Change -- 13.04 %    Distance Feet Change -- 150 ft    Walk Time 6 minutes 6 minutes    # of Rest Breaks 0 0    MPH 2.18 2.46    METS 3.47 3.66    RPE 8 10    VO2 Peak 12.16 12.8    Symptoms No No    Resting HR 97 bpm 86 bpm    Resting BP 110/80 120/60    Resting Oxygen Saturation  96 % 96 %    Exercise Oxygen Saturation  during 6 min walk 96 % 96 %    Max Ex. HR 111 bpm 106 bpm    Max Ex. BP 130/78 118/80    2 Minute Post BP 120/78 --       Oxygen Initial Assessment:   Oxygen Re-Evaluation:   Oxygen Discharge (Final Oxygen Re-Evaluation):   Initial Exercise Prescription:  Initial Exercise Prescription - 11/15/23 1100       Date of Initial Exercise RX and Referring Provider   Date 11/15/23    Referring Provider Carl Lenis MD   Carl Carrier MD sending everything     Treadmill   MPH 1.8    Grade 0.5    Minutes 15    METs 2.5      NuStep   Level 5    SPM 50    Minutes 15    METs 2      Prescription Details   Frequency (times per week) 2    Duration Progress to 30 minutes of continuous aerobic without signs/symptoms of physical distress      Intensity   THRR 40-80% of Max Heartrate 126-156    Ratings of Perceived Exertion 11-13    Perceived Dyspnea 0-4      Resistance Training   Training Prescription Yes    Weight 5    Reps 10-15           Perform Capillary Blood Glucose checks as needed.  Exercise Prescription Changes:   Exercise Prescription Changes     Row Name 11/15/23 1100 11/18/23 1500 12/21/23 1500 01/13/24 1500 01/27/24 1500     Response to Exercise   Blood Pressure (Admit) 110/80 118/88 116/70 134/70 122/66   Blood Pressure (Exercise) 130/78 140/80 -- -- --   Blood Pressure (Exit) 120/78 140/100 124/80 126/68 124/70   Heart Rate (Admit) 97 bpm 65 bpm 78 bpm 563 bpm 73 bpm   Heart Rate (Exercise) 111 bpm 139 bpm 162 bpm 138 bpm 150 bpm   Heart Rate (  Exit) 85 bpm 74 bpm 128 bpm 112 bpm 120 bpm   Oxygen Saturation (Admit) 96 % -- -- -- --   Oxygen Saturation (Exercise) 96 % -- -- -- --   Oxygen Saturation (Exit) 96 % -- -- -- --   Rating of Perceived Exertion (Exercise) 8 9 13 14 13    Duration -- Continue with 30 min of aerobic exercise without signs/symptoms of physical distress. Continue with 30 min of aerobic exercise without signs/symptoms of physical distress. Continue with 30 min of aerobic exercise without signs/symptoms of physical distress. Continue with 30 min of aerobic exercise without signs/symptoms of physical distress.   Intensity -- THRR unchanged THRR unchanged THRR unchanged THRR unchanged     Progression   Progression -- Continue to progress workloads to maintain intensity without signs/symptoms of physical distress. Continue to progress workloads to maintain intensity without signs/symptoms of physical distress. Continue to progress workloads to maintain intensity without signs/symptoms of physical distress. Continue to progress workloads to maintain intensity without signs/symptoms of physical distress.     Resistance Training   Training Prescription -- Yes Yes Yes Yes   Weight -- 5 5 5 10    Reps -- 10-15 10-15 10-15 10-15     Treadmill   MPH -- 2.5 3 2.8 2.8   Grade -- 1 4 8  0   Minutes -- 15 1 15 15    METs -- 3.26 4.95 6.23 3.14     NuStep   Level -- 8 8 8 10    SPM -- 81 98 94  101   Minutes -- 15 15 15 15    METs -- 4.4 6.2 5 5.8    Row Name 02/22/24 1500             Response to Exercise   Blood Pressure (Admit) 114/74       Blood Pressure (Exit) 114/78       Heart Rate (Admit) 85 bpm       Heart Rate (Exercise) 145 bpm       Heart Rate (Exit) 90 bpm       Rating of Perceived Exertion (Exercise) 14       Duration Continue with 30 min of aerobic exercise without signs/symptoms of physical distress.       Intensity THRR unchanged         Progression   Progression Continue to progress workloads to maintain intensity without signs/symptoms of physical distress.         Resistance Training   Weight 7       Reps 10-15         Treadmill   MPH 2.2       Grade 14       Minutes 15       METs 6.93         NuStep   Level 10       SPM 96       Minutes 15       METs 6.3          Exercise Comments:   Exercise Comments     Row Name 11/16/23 1452           Exercise Comments First full day of exercise!  Patient was oriented to gym and equipment including functions, settings, policies, and procedures.  Patient's individual exercise prescription and treatment plan were reviewed.  All starting workloads were established based on the results of the 6 minute walk test done at initial orientation visit.  The plan for exercise progression was also introduced and progression will be customized based on patient's performance and goals.          Exercise Goals and Review:   Exercise Goals     Row Name 11/15/23 1151             Exercise Goals   Increase Physical Activity Yes       Intervention Provide advice, education, support and counseling about physical activity/exercise needs.;Develop an individualized exercise prescription for aerobic and resistive training based on initial evaluation findings, risk stratification, comorbidities and participant's personal goals.       Expected Outcomes Short Term: Attend rehab on a regular basis to increase  amount of physical activity.;Long Term: Add in home exercise to make exercise part of routine and to increase amount of physical activity.;Long Term: Exercising regularly at least 3-5 days a week.       Increase Strength and Stamina Yes       Intervention Provide advice, education, support and counseling about physical activity/exercise needs.;Develop an individualized exercise prescription for aerobic and resistive training based on initial evaluation findings, risk stratification, comorbidities and participant's personal goals.       Expected Outcomes Short Term: Increase workloads from initial exercise prescription for resistance, speed, and METs.;Short Term: Perform resistance training exercises routinely during rehab and add in resistance training at home;Long Term: Improve cardiorespiratory fitness, muscular endurance and strength as measured by increased METs and functional capacity ( )       Able to understand and use rate of perceived exertion (RPE) scale Yes       Intervention Provide education and explanation on how to use RPE scale       Expected Outcomes Short Term: Able to use RPE daily in rehab to express subjective intensity level;Long Term:  Able to use RPE to guide intensity level when exercising independently       Able to understand and use Dyspnea scale Yes       Intervention Provide education and explanation on how to use Dyspnea scale       Expected Outcomes Short Term: Able to use Dyspnea scale daily in rehab to express subjective sense of shortness of breath during exertion;Long Term: Able to use Dyspnea scale to guide intensity level when exercising independently       Knowledge and understanding of Target Heart Rate Range (THRR) Yes       Intervention Provide education and explanation of THRR including how the numbers were predicted and where they are located for reference       Expected Outcomes Long Term: Able to use THRR to govern intensity when exercising  independently;Short Term: Able to state/look up THRR;Short Term: Able to use daily as guideline for intensity in rehab       Able to check pulse independently Yes       Intervention Provide education and demonstration on how to check pulse in carotid and radial arteries.;Review the importance of being able to check your own pulse for safety during independent exercise       Expected Outcomes Short Term: Able to explain why pulse checking is important during independent exercise;Long Term: Able to check pulse independently and accurately       Understanding of Exercise Prescription Yes       Intervention Provide education, explanation, and written materials on patient's individual exercise prescription       Expected Outcomes Short Term: Able to explain program exercise prescription;Long Term: Able  to explain home exercise prescription to exercise independently          Exercise Goals Re-Evaluation :  Exercise Goals Re-Evaluation     Row Name 11/16/23 1453 12/21/23 1531 01/11/24 1537 02/14/24 0936       Exercise Goal Re-Evaluation   Exercise Goals Review Able to understand and use rate of perceived exertion (RPE) scale;Knowledge and understanding of Target Heart Rate Range (THRR) Increase Physical Activity;Increase Strength and Stamina;Able to understand and use Dyspnea scale;Able to check pulse independently;Knowledge and understanding of Target Heart Rate Range (THRR);Able to understand and use rate of perceived exertion (RPE) scale;Understanding of Exercise Prescription Increase Physical Activity;Increase Strength and Stamina;Able to understand and use Dyspnea scale;Able to understand and use rate of perceived exertion (RPE) scale;Knowledge and understanding of Target Heart Rate Range (THRR);Able to check pulse independently;Understanding of Exercise Prescription Increase Physical Activity;Increase Strength and Stamina;Understanding of Exercise Prescription    Comments Reviewed RPE and dyspnea  scale, THR and program prescription with pt today.  Pt voiced understanding and was given a copy of goals to take home. Cordarius is doing great in rehab! Micajah notes he is not exercising at home due to financial stress. In talking with him he notes his road being a private road, discussed with him to try walking his road for exercise and it may possibily relieve some stress. Briceson is doing great in rehab! Dent is back to work now and he is trying to work as much as possible to make up money he lost being out. So he is not exercising much at home. Sade is doing well in rehab. He has been back to work. He is pushing hisself in class but has been feeling good,.    Expected Outcomes Short: Use RPE daily to regulate intensity.  Long: Follow program prescription in THR. Short: continue to attend rehab. Long: start walking private road for exercise Short: continue to attend rehab. Long: exercising daily for at least 30 minutes. Short: continue to attend rehab. Long: exercising daily for at least 30 minutes.       Discharge Exercise Prescription (Final Exercise Prescription Changes):  Exercise Prescription Changes - 02/22/24 1500       Response to Exercise   Blood Pressure (Admit) 114/74    Blood Pressure (Exit) 114/78    Heart Rate (Admit) 85 bpm    Heart Rate (Exercise) 145 bpm    Heart Rate (Exit) 90 bpm    Rating of Perceived Exertion (Exercise) 14    Duration Continue with 30 min of aerobic exercise without signs/symptoms of physical distress.    Intensity THRR unchanged      Progression   Progression Continue to progress workloads to maintain intensity without signs/symptoms of physical distress.      Resistance Training   Weight 7    Reps 10-15      Treadmill   MPH 2.2    Grade 14    Minutes 15    METs 6.93      NuStep   Level 10    SPM 96    Minutes 15    METs 6.3          Nutrition:  Target Goals: Understanding of nutrition guidelines, daily intake of sodium 1500mg ,  cholesterol 200mg , calories 30% from fat and 7% or less from saturated fats, daily to have 5 or more servings of fruits and vegetables.  Education: Nutrition 1 -Group instruction provided by verbal, written material, interactive activities, discussions, models, and posters to  present general guidelines for heart healthy nutrition including macronutrients, label reading, and promoting whole foods over processed counterparts. Education serves as pensions consultant of discussion of heart healthy eating for all. Written material provided at class time. Flowsheet Row CARDIAC REHAB PHASE II EXERCISE from 03/09/2024 in [place] CARDIAC REH[facility]ITATION  Education need identified 11/15/23  Date 03/09/24  Educator [facility]  Instruction Review Code 1- Verbalizes Understanding     Education: Nutrition 2 -Group instruction provided by verbal, written material, interactive activities, discussions, models, and posters to present general guidelines for heart healthy nutrition including sodium, cholesterol, and saturated fat. Providing guidance of habit forming to improve blood pressure, cholesterol, and body weight. Written material provided at class time. Flowsheet Row CARDIAC REHAB PHASE II EXERCISE from 03/09/2024 in [place] [facility]  Education need identified 11/15/23  Date 03/09/24  Educator [facility]  Instruction Review Code 1- Verbalizes Understanding      Biometrics:  Pre Biometrics - 11/15/23 1152       Pre Biometrics   Height 5' 11 (1.803 m)    Weight 113.2 kg    Waist Circumference 46 inches    Hip Circumference 43 inches    Waist to Hip Ratio 1.07 %    BMI (Calculated) 34.82    Grip Strength 35.6 kg    Single Leg Stand 30 seconds          Post Biometrics - 03/08/24 1037        Post  Biometrics   Height 5' 11 (1.803 m)    Weight 109.5 kg    Waist Circumference 46 inches    Hip Circumference 44 inches    Waist to Hip Ratio 1.05 %    BMI (Calculated) 33.68    Grip Strength  15.8 kg    Single Leg Stand 30 seconds          Nutrition Therapy Plan and Nutrition Goals:   Nutrition Assessments:  MEDIFICTS Score Key: >=70 Need to make dietary changes  40-70 Heart Healthy Diet <= 40 Therapeutic Level Cholesterol Diet  Flowsheet Row CARDIAC REHAB PHASE II ORIENTATION from 11/15/2023 in [facility] CARDIAC REHABILITATION  Picture Your Plate Total Score on Admission 57   Picture Your Plate Scores: <59 Unhealthy dietary pattern with much room for improvement. 41-50 Dietary pattern unlikely to meet recommendations for good health and room for improvement. 51-60 More healthful dietary pattern, with some room for improvement.  >60 Healthy dietary pattern, although there may be some specific behaviors that could be improved.    Nutrition Goals Re-Evaluation:  Nutrition Goals Re-Evaluation     Row Name 12/21/23 1522 01/11/24 1533 02/14/24 0939         Goals   Nutrition Goal Healthy diet Healthy diet Healthy diet     Comment Nyeem is not following any certain diet currently. Baylen is struggling financially at this time and he is only eating what he can afford. Spoke with him about possible resources for food. Tyaire is eating better now that he has more money for food. Spoke with him about healthy food options and try not to eat a lot of fast food and watch his salt intake. Keldrick is trying to eat better now that he has more money for food. He is trying to not eat as much fastfood but when he is on the road he has too. Need to focuse on healtier options when picking from fast food menu     Expected Outcome Short: reach out to food banks  if needed for more balanced meals. Long: Maintain a healthy well balanced diet. Short: monitor salt in diet. Long: eat healthy well balanced meals. Short: monitor salt in diet. Long: eat healthy well balanced meals.        Nutrition Goals Discharge (Final Nutrition Goals Re-Evaluation):  Nutrition Goals Re-Evaluation - 02/14/24 0939        Goals   Nutrition Goal Healthy diet    Comment Josiel is trying to eat better now that he has more money for food. He is trying to not eat as much fastfood but when he is on the road he has too. Need to focuse on healtier options when picking from fast food menu    Expected Outcome Short: monitor salt in diet. Long: eat healthy well balanced meals.          Psychosocial: Target Goals: Acknowledge presence or absence of significant depression and/or stress, maximize coping skills, provide positive support system. Participant is able to verbalize types and ability to use techniques and skills needed for reducing stress and depression.   Education: Stress, Anxiety, and Depression - Group verbal and visual presentation to define topics covered.  Reviews how body is impacted by stress, anxiety, and depression.  Also discusses healthy ways to reduce stress and to treat/manage anxiety and depression. Written material provided at class time. Flowsheet Row CARDIAC REHAB PHASE II EXERCISE from 03/09/2024 in [place] CARD[facility] RE[facility]  Education need identified 11/15/23    Education: Sleep Hygiene -Provides group verbal and written instruction about how sleep can affect your health.  Define sleep hygiene, discuss sleep cycles and impact of sleep habits. Review good sleep hygiene tips.   Initial Review & Psychosocial Screening:  Initial Psych Review & Screening - 11/12/23 1417       Initial Review   Current issues with Current Anxiety/Panic;History of Depression;Current Depression      Family Dynamics   Good Support System? No    Concerns No support system    Comments When ask who supports him, he states no one.      Barriers   Psychosocial barriers to participate in program The patient should benefit from training in stress management and relaxation.;There are no identifiable barriers or psychosocial needs.      Screening Interventions   Interventions To provide support and  resources with identified psychosocial needs;Provide feedback about the scores to participant;Encouraged to exercise    Expected Outcomes Short Term goal: Utilizing psychosocial counselor, staff and physician to assist with identification of specific Stressors or current issues interfering with healing process. Setting desired goal for each stressor or current issue identified.;Long Term Goal: Stressors or current issues are controlled or eliminated.;Short Term goal: Identification and review with participant of any Quality of Life or Depression concerns found by scoring the questionnaire.;Long Term goal: The participant improves quality of Life and PHQ9 Scores as seen by post scores and/or verbalization of changes          Quality of Life Scores:   Quality of Life - 11/15/23 1158       Quality of Life   Select Quality of Life      Quality of Life Scores   Health/Function Pre 15.27 %    Socioeconomic Pre 16.13 %    Psych/Spiritual Pre 12.21 %    Family Pre 15.5 %    GLOBAL Pre 14.89 %         Scores of 19 and below usually indicate a poorer quality of life in  these areas.  A difference of  2-3 points is a clinically meaningful difference.  A difference of 2-3 points in the total score of the Quality of Life Index has been associated with significant improvement in overall quality of life, self-image, physical symptoms, and general health in studies assessing change in quality of life.  PHQ-9: Review Flowsheet  More data exists      11/15/2023 10/02/2021 06/26/2021 04/03/2021 01/02/2021  Depression screen PHQ 2/9  Decreased Interest 3 3 3 3 3   Down, Depressed, Hopeless 2 3 3 3 3   PHQ - 2 Score 5 6 6 6 6   Altered sleeping 0 3 3 3 3   Tired, decreased energy 0 3 3 3 3   Change in appetite 0 3 3 3 3   Feeling bad or failure about yourself  3 3 3 3 3   Trouble concentrating 0 3 3 3 3   Moving slowly or fidgety/restless 0 3 3 3 3   Suicidal thoughts 0 3 3 3 3   PHQ-9 Score 8  27  27  27  27     Difficult doing work/chores Not difficult at all - Extremely dIfficult - -    Details       Data saved with a previous flowsheet row definition        Interpretation of Total Score  Total Score Depression Severity:  1-4 = Minimal depression, 5-9 = Mild depression, 10-14 = Moderate depression, 15-19 = Moderately severe depression, 20-27 = Severe depression   Psychosocial Evaluation and Intervention:  Psychosocial Evaluation - 11/12/23 1418       Psychosocial Evaluation & Interventions   Interventions Stress management education;Relaxation education;Encouraged to exercise with the program and follow exercise prescription    Comments Patient referred to cardiac rehab with NSTEMI/Stent placement. He has depression and anxiety listed in his medical history but says he has no more than his usual. He is currently not being treated. He lives alone and says he has no support person. 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 Olivia Pavy, PA put lifting restrictions of no  more than 10 lbs at his visit and he said he could not work with these restrictions. He is a current smoker 0.5 ppd and vapes also and is not ready to quit. His goals for the program are to be able to return to work. He says he will still be able to come to sessions after he returns to work. He has no barriers identified to complete the program.    Expected Outcomes Short Term: Patient will start the program and attend consistently. Long Term: Patient will complete the program meeting personal goals.    Continue Psychosocial Services  Follow up required by staff          Psychosocial Re-Evaluation:  Psychosocial Re-Evaluation     Row Name 12/21/23 1516 01/11/24 1529 02/14/24 0937         Psychosocial Re-Evaluation   Current issues with Current Stress Concerns;Current Sleep Concerns;Current Anxiety/Panic Current Stress Concerns Current Stress Concerns     Comments Omarr is doing great in rehab!  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 He notes not having any money, he has some friends who have helped him a little, but no family support. He notes being behind on his mortgage and that he doesn't have much food left. Spoke to him about possible resources available, but  he also mentions he has some things he is going to sell to get some cash to help with bills. He notes he should be cleared for back to work soon within the next week or two. Reyli has now went back to work! He feels much better, he notes things are getting back on track, but it will take awhile to get back to where I was financially. graiden has went back to work and has been feeling better. He has been placed on trazodone  as needed to help with stress. He is worried about where he is financially     Expected Outcomes Short: get cleared by MD to go back to work. Long: work on having healthy stress outlets and once back to work start to catch up on bills. Short: continue to attend rehab. Long: continue to work and catch up on bills. Short: continue to attend rehab. Long: continue to work and catch up on bills.     Interventions Encouraged to attend Cardiac Rehabilitation for the exercise Encouraged to attend Cardiac Rehabilitation for the exercise Encouraged to attend Cardiac Rehabilitation for the exercise     Continue Psychosocial Services  Follow up required by staff Follow up required by staff Follow up required by staff        Psychosocial Discharge (Final Psychosocial Re-Evaluation):  Psychosocial Re-Evaluation - 02/14/24 0937       Psychosocial Re-Evaluation   Current issues with Current Stress Concerns    Comments jarone has went back to work and has been feeling better. He has been placed on trazodone  as needed to help with stress. He is worried about where he is financially    Expected Outcomes Short: continue to attend rehab.  Long: continue to work and catch up on bills.    Interventions Encouraged to attend Cardiac Rehabilitation for the exercise    Continue Psychosocial Services  Follow up required by staff          Vocational Rehabilitation: Provide vocational rehab assistance to qualifying candidates.   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          Education: Education Goals: Education classes will be provided on a variety of topics geared toward better understanding of heart health and risk factor modification. Participant will state understanding/return demonstration of topics presented as noted by education test scores.  Learning Barriers/Preferences:  Learning Barriers/Preferences - 11/12/23 1416       Learning Barriers/Preferences   Learning Barriers None    Learning Preferences Written Material;Audio;Skilled Demonstration          General Cardiac Education Topics:  AED/CPR: - Group verbal and written instruction with the use of models to demonstrate the basic use of the AED with the basic ABC's of resuscitation.   Test and Procedures: - Group verbal and visual presentation and models provide information about basic cardiac anatomy and function. Reviews the testing methods done to diagnose heart disease and the outcomes of the test results. Describes the treatment choices: Medical Management, Angioplasty, or Coronary Bypass Surgery for treating various heart conditions including Myocardial Infarction, Angina, Valve Disease, and Cardiac Arrhythmias. Written material provided at class time.   Medication Safety: - Group verbal and visual instruction to review commonly prescribed medications for  heart and lung disease. Reviews the  medication, class of the drug, and side effects. Includes the steps to properly store meds and maintain the prescription regimen. Written material provided at class time. Flowsheet Row CARDIAC REHAB PHASE II EXERCISE from 03/09/2024 in [facility]  Date 11/25/23  Educator dj  Instruction Review Code 2- Demonstrated Understanding    Intimacy: - Group verbal instruction through game format to discuss how heart and lung disease can affect sexual intimacy. Written material provided at class time.   Know Your Numbers and Heart Failure: - Group verbal and visual instruction to discuss disease risk factors for cardiac and pulmonary disease and treatment options.  Reviews associated critical values for Overweight/Obesity, Hypertension, Cholesterol, and Diabetes.  Discusses basics of heart failure: signs/symptoms and treatments.  Introduces Heart Failure Zone chart for action plan for heart failure. Written material provided at class time.   Infection Prevention: - Provides verbal and written material to individual with discussion of infection control including proper hand washing and proper equipment cleaning during exercise session.   Falls Prevention: - Provides verbal and written material to individual with discussion of falls prevention and safety.   Other: -Provides group and verbal instruction on various topics (see comments)   Knowledge Questionnaire Score:   Core Components/Risk Factors/Patient Goals at Admission:  Personal Goals and Risk Factors at Admission - 11/12/23 1416       Core Components/Risk Factors/Patient Goals on Admission    Weight Management Obesity    Diabetes Yes    Intervention Provide education about signs/symptoms and action to take for hypo/hyperglycemia.;Provide education about proper nutrition, including hydration, and aerobic/resistive exercise prescription along with prescribed medications to achieve blood glucose in normal ranges:  Fasting glucose 65-99 mg/dL    Expected Outcomes Short Term: Participant verbalizes understanding of the signs/symptoms and immediate care of hyper/hypoglycemia, proper foot care and importance of medication, aerobic/resistive exercise and nutrition plan for blood glucose control.;Long Term: Attainment of HbA1C < 7%.    Hypertension Yes    Intervention Provide education on lifestyle modifcations including regular physical activity/exercise, weight management, moderate sodium restriction and increased consumption of fresh fruit, vegetables, and low fat dairy, alcohol moderation, and smoking cessation.;Monitor prescription use compliance.    Expected Outcomes Short Term: Continued assessment and intervention until BP is < 140/32mm HG in hypertensive participants. < 130/60mm HG in hypertensive participants with diabetes, heart failure or chronic kidney disease.;Long Term: Maintenance of blood pressure at goal levels.    Lipids Yes    Intervention Provide education and support for participant on nutrition & aerobic/resistive exercise along with prescribed medications to achieve LDL 70mg , HDL >40mg .    Expected Outcomes Short Term: Participant states understanding of desired cholesterol values and is compliant with medications prescribed. Participant is following exercise prescription and nutrition guidelines.;Long Term: Cholesterol controlled with medications as prescribed, with individualized exercise RX and with personalized nutrition plan. Value goals: LDL < 70mg , HDL > 40 mg.          Education:Diabetes - Individual verbal and written instruction to review signs/symptoms of diabetes, desired ranges of glucose level fasting, after meals and with exercise. Acknowledge that pre and post exercise glucose checks will be done for 3 sessions at entry of program.   Core Components/Risk Factors/Patient Goals Review:   Goals and Risk Factor Review     Row Name 12/21/23 1525 01/11/24 1535 02/14/24 0940          Core Components/Risk Factors/Patient Goals Review   Personal  Goals Review Hypertension;Lipids;Diabetes Hypertension;Lipids;Diabetes Hypertension;Lipids;Diabetes     Review Tell is struggling financially. Felton notes that he got 90 day supply of all of his meds right before his money ran out after not being able to go back to work. So he notes having a taking all of his medications. He is also making sure to keep all his follow ups with his doctors so he can get back to work asap. Miklo is better now financially, so he has all of his medications he needs. spoke with him about making sure to take medications as prescribed and checking blood pressure and blood sugars at home. Bryen has been doing well in rehab. He is trying to take his BP and is taking his medications as prescribed     Expected Outcomes Short: Get back to work soon. Long: continue to take medications as prescribed by MD. Short: monitor blood pressure and blood sugar at home. Long: continue to take all medications as prescribed by MD. Short: monitor blood pressure and blood sugar at home. Long: continue to take all medications as prescribed by MD.        Core Components/Risk Factors/Patient Goals at Discharge (Final Review):   Goals and Risk Factor Review - 02/14/24 0940       Core Components/Risk Factors/Patient Goals Review   Personal Goals Review Hypertension;Lipids;Diabetes    Review Talyn has been doing well in rehab. He is trying to take his BP and is taking his medications as prescribed    Expected Outcomes Short: monitor blood pressure and blood sugar at home. Long: continue to take all medications as prescribed by MD.          ITP Comments:  ITP Comments     Row Name 11/12/23 1429 11/15/23 1157 11/16/23 1452 12/01/23 1816 12/29/23 0924   ITP Comments Virtual orientation visit completed for cardiac rehab with NSTEMI/Stent placement. On-site orientation visit scheduled for 11/15/23 at 10:30. Patient arrived for  1st visit/orientation/education at 1030. Patient was referred to CR by Alm Harding/Dr. Branch attending due to NSTEMI/Stent placement. During orientation advised patient on arrival and appointment times what to wear, what to do before, during and after exercise. Reviewed attendance and class policy.  Pt is scheduled to return Cardiac Rehab on 11/16/23 at 1500. Pt was advised to come to class 15 minutes before class starts.  Discussed RPE/Dpysnea scales. Patient participated in warm up stretches. Patient was able to complete 6 minute walk test.  Telemetry:NSR. Patient was measured for the equipment. Discussed equipment safety with patient. Took patient pre-anthropometric measurements. Patient finished visit at 1145. First full day of exercise!  Patient was oriented to gym and equipment including functions, settings, policies, and procedures.  Patient's individual exercise prescription and treatment plan were reviewed.  All starting workloads were established based on the results of the 6 minute walk test done at initial orientation visit.  The plan for exercise progression was also introduced and progression will be customized based on patient's performance and goals. 30 day review completed. ITP sent to Dr. Dorn Ross, Medical Director of Cardiac Rehab. Continue with ITP unless changes are made by physician.  Newer to program 30 day review completed. ITP sent to Dr. Dorn Ross, Medical Director of Cardiac Rehab. Continue with ITP unless changes are made by physician.    Row Name 01/25/24 1132 02/22/24 1649 03/09/24 1536       ITP Comments 30 day review completed. ITP sent to Dr. Dorn Ross, Medical Director of Cardiac Rehab. Continue  with ITP unless changes are made by physician. 30 day review completed. ITP sent to Dr. Dorn Ross, Medical Director of Cardiac Rehab. Continue with ITP unless changes are made by physician. Job graduated today from  rehab with 36 sessions completed.  Details  of the patient's exercise prescription and what He needs to do in order to continue the prescription and progress were discussed with patient.  Patient was given a copy of prescription and goals.  Patient verbalized understanding. Sheryl plans to continue to exercise by walking.        Comments: discharge ITP    [1]  Current Outpatient Medications:    amLODipine  (NORVASC ) 2.5 MG tablet, TAKE 1 TABLET BY MOUTH DAILY, Disp: 90 tablet, Rfl: 3   Ascorbic Acid (VITAMIN C) 1000 MG tablet, Take 2,000 mg by mouth daily., Disp: , Rfl:    aspirin  EC 81 MG tablet, Take 81 mg by mouth daily. , Disp: , Rfl:    atorvastatin  (LIPITOR ) 80 MG tablet, TAKE 1 TABLET BY MOUTH DAILY, Disp: 90 tablet, Rfl: 3   b complex vitamins capsule, Take 1 capsule by mouth daily., Disp: , Rfl:    clopidogrel  (PLAVIX ) 75 MG tablet, Take 1 tablet (75 mg total) by mouth daily with breakfast., Disp: 90 tablet, Rfl: 3   empagliflozin  (JARDIANCE ) 10 MG TABS tablet, Take 1 tablet (10 mg total) by mouth daily before breakfast., Disp: 90 tablet, Rfl: 3   lansoprazole  (PREVACID ) 30 MG capsule, Take 1 capsule (30 mg total) by mouth 2 (two) times daily before a meal., Disp: 180 capsule, Rfl: 3   lisinopril  (ZESTRIL ) 5 MG tablet, Take 1 tablet (5 mg total) by mouth daily., Disp: 90 tablet, Rfl: 3   metoprolol  succinate (TOPROL -XL) 50 MG 24 hr tablet, TAKE 1 TABLET BY MOUTH EVERY DAY with OR immediately AFTER a meal, Disp: 90 tablet, Rfl: 3   nitroGLYCERIN  (NITROSTAT ) 0.4 MG SL tablet, Place 1 tablet (0.4 mg total) under the tongue every 5 (five) minutes x 3 doses as needed for chest pain (if no relief after 2nd dose, proceed to ED or call 911)., Disp: 25 tablet, Rfl: 3   sertraline  (ZOLOFT ) 50 MG tablet, Take 1 tablet (50 mg total) by mouth daily., Disp: 30 tablet, Rfl: 2   tirzepatide  (MOUNJARO ) 7.5 MG/0.5ML Pen, Inject 7.5 mg into the skin once a week., Disp: 6 mL, Rfl: 3   traZODone  (DESYREL ) 50 MG tablet, Take 0.5-1 tablets (25-50 mg  total) by mouth at bedtime as needed for sleep., Disp: 90 tablet, Rfl: 3 [2]  Social History Tobacco Use  Smoking Status Every Day   Current packs/day: 1.00   Average packs/day: 1 pack/day for 31.2 years (31.2 ttl pk-yrs)   Types: Cigarettes   Start date: 01/03/1993  Smokeless Tobacco Never

## 2024-03-09 NOTE — Progress Notes (Signed)
 Daily Session Note  Patient Details  Name: Carl [name] MRN: [id number] Date of Birth: 18-Aug-1975 Referring Provider:   Flowsheet Row CARDIAC REHAB PHASE II ORIENTATION from 11/15/2023 in [facility]  Referring Provider Anner Lenis MD  Suzzette Carrier MD sending everything]    Encounter Date: 03/09/2024  Check In:  Session Check In - 03/09/24 1435       Check-In   Supervising physician immediately available to respond to emergencies See telemetry face sheet for immediately available MD    Location [facility]    Staff Present Harlene Gelineau, MA, RCEP, CCRP, Sueellen Louder, RN, Film/video Editor BSN, RN    Virtual Visit No    Medication changes reported     No    Fall or balance concerns reported    No    Tobacco Cessation No Change    Warm-up and Cool-down Performed on first and last piece of equipment    Resistance Training Performed Yes    VAD Patient? No    PAD/SET Patient? No      Pain Assessment   Currently in Pain? No/denies    Pain Score 0-No pain    Multiple Pain Sites No          Capillary Blood Glucose: No results found for this or any previous visit (from the past 24 hours).    Tobacco Use History[1]  Goals Met:  Independence with exercise equipment Exercise tolerated well No report of concerns or symptoms today Strength training completed today  Goals Unmet:  Not Applicable  Comments: .Carl [name] graduated today from  rehab with 36 sessions completed.  Details of the patient's exercise prescription and what He needs to do in order to continue the prescription and progress were discussed with patient.  Patient was given a copy of prescription and goals.  Patient verbalized understanding. Carl [name] plans to continue to exercise by walking.        [1]  Social History Tobacco Use  Smoking Status Every Day   Current packs/day: 1.00   Average packs/day: 1 pack/day for 31.2 years (31.2 ttl pk-yrs)    Types: Cigarettes   Start date: 01/03/1993  Smokeless Tobacco Never

## 2024-03-09 NOTE — Progress Notes (Signed)
 Discharge Progress Report  Patient Details  Name: Carl [name] MRN: [id number] Date of Birth: 07/06/1975 Referring Provider:   Flowsheet Row CARDIAC REHAB PHASE II ORIENTATION from 11/15/2023 in [facility] CARDIAC REHAB[facility]IT[facility]  Referring Provider Anner Lenis MD  Suzzette Carrier MD sending everything]     Number of Visits: 77  Reason for Discharge:  Patient reached a stable level of exercise. Patient independent in their exercise. Patient has met program and personal goals.  Smoking History:  Tobacco Use History[1]  Diagnosis:  NSTEMI (non-ST elevated myocardial infarction) (HCC)  Status post coronary artery stent placement  ADL UCSD:   Initial Exercise Prescription:  Initial Exercise Prescription - 11/15/23 1100       Date of Initial Exercise RX and Referring Provider   Date 11/15/23    Referring Provider Anner Lenis MD   Alvan Carrier MD sending everything     Treadmill   MPH 1.8    Grade 0.5    Minutes 15    METs 2.5      NuStep   Level 5    SPM 50    Minutes 15    METs 2      Prescription Details   Frequency (times per week) 2    Duration Progress to 30 minutes of continuous aerobic without signs/symptoms of physical distress      Intensity   THRR 40-80% of Max Heartrate 126-156    Ratings of Perceived Exertion 11-13    Perceived Dyspnea 0-4      Resistance Training   Training Prescription Yes    Weight 5    Reps 10-15          Discharge Exercise Prescription (Final Exercise Prescription Changes):  Exercise Prescription Changes - 02/22/24 1500       Response to Exercise   Blood Pressure (Admit) 114/74    Blood Pressure (Exit) 114/78    Heart Rate (Admit) 85 bpm    Heart Rate (Exercise) 145 bpm    Heart Rate (Exit) 90 bpm    Rating of Perceived Exertion (Exercise) 14    Duration Continue with 30 min of aerobic exercise without signs/symptoms of physical distress.    Intensity THRR unchanged      Progression   Progression  Continue to progress workloads to maintain intensity without signs/symptoms of physical distress.      Resistance Training   Weight 7    Reps 10-15      Treadmill   MPH 2.2    Grade 14    Minutes 15    METs 6.93      NuStep   Level 10    SPM 96    Minutes 15    METs 6.3          Functional Capacity:  6 Minute Walk     Row Name 11/15/23 1147 03/08/24 1032       6 Minute Walk   Phase Initial Discharge    Distance 1150 feet 1300 feet    Distance % Change -- 13.04 %    Distance Feet Change -- 150 ft    Walk Time 6 minutes 6 minutes    # of Rest Breaks 0 0    MPH 2.18 2.46    METS 3.47 3.66    RPE 8 10    VO2 Peak 12.16 12.8    Symptoms No No    Resting HR 97 bpm 86 bpm    Resting BP 110/80 120/60  Resting Oxygen Saturation  96 % 96 %    Exercise Oxygen Saturation  during 6 min walk 96 % 96 %    Max Ex. HR 111 bpm 106 bpm    Max Ex. BP 130/78 118/80    2 Minute Post BP 120/78 --       Psychological, QOL, Others - Outcomes: PHQ 2/9:    11/15/2023   11:39 AM 10/02/2021    1:15 PM 06/26/2021    3:36 PM 04/03/2021    3:51 PM 01/02/2021    1:25 PM  Depression screen PHQ 2/9  Decreased Interest 3 3 3 3 3   Down, Depressed, Hopeless 2 3 3 3 3   PHQ - 2 Score 5 6 6 6 6   Altered sleeping 0 3 3 3 3   Tired, decreased energy 0 3 3 3 3   Change in appetite 0 3 3 3 3   Feeling bad or failure about yourself  3 3 3 3 3   Trouble concentrating 0 3 3 3 3   Moving slowly or fidgety/restless 0 3 3 3 3   Suicidal thoughts 0 3 3 3 3   PHQ-9 Score 8  27  27  27  27    Difficult doing work/chores Not difficult at all  Extremely dIfficult       Data saved with a previous flowsheet row definition    Quality of Life:  Quality of Life - 11/15/23 1158       Quality of Life   Select Quality of Life      Quality of Life Scores   Health/Function Pre 15.27 %    Socioeconomic Pre 16.13 %    Psych/Spiritual Pre 12.21 %    Family Pre 15.5 %    GLOBAL Pre 14.89 %            Nutrition & Weight - Outcomes:  Pre Biometrics - 11/15/23 1152       Pre Biometrics   Height 5' 11 (1.803 m)    Weight 113.2 kg    Waist Circumference 46 inches    Hip Circumference 43 inches    Waist to Hip Ratio 1.07 %    BMI (Calculated) 34.82    Grip Strength 35.6 kg    Single Leg Stand 30 seconds          Post Biometrics - 03/08/24 1037        Post  Biometrics   Height 5' 11 (1.803 m)    Weight 109.5 kg    Waist Circumference 46 inches    Hip Circumference 44 inches    Waist to Hip Ratio 1.05 %    BMI (Calculated) 33.68    Grip Strength 15.8 kg    Single Leg Stand 30 seconds          Nutrition:   Nutrition Discharge:   Education Questionnaire Score:   Goals reviewed with patient; copy given to patient.    [1]  Social History Tobacco Use  Smoking Status Every Day   Current packs/day: 1.00   Average packs/day: 1 pack/day for 31.2 years (31.2 ttl pk-yrs)   Types: Cigarettes   Start date: 01/03/1993  Smokeless Tobacco Never

## 2024-03-14 ENCOUNTER — Encounter (HOSPITAL_COMMUNITY)

## 2024-03-16 ENCOUNTER — Ambulatory Visit: Admitting: Family Medicine

## 2024-04-05 ENCOUNTER — Ambulatory Visit: Admitting: Family Medicine

## 2024-04-27 ENCOUNTER — Ambulatory Visit: Admitting: Family Medicine

## 2024-06-22 ENCOUNTER — Ambulatory Visit: Admitting: Student
